# Patient Record
Sex: Female | Born: 1945 | Race: White | Hispanic: No | Smoking: Former smoker
Health system: Southern US, Community
[De-identification: ages and names within clinical notes are randomized; demographics above are authoritative.]

## PROBLEM LIST (undated history)

## (undated) DIAGNOSIS — J45909 Unspecified asthma, uncomplicated: Secondary | ICD-10-CM

## (undated) DIAGNOSIS — Z973 Presence of spectacles and contact lenses: Secondary | ICD-10-CM

## (undated) DIAGNOSIS — F32A Depression, unspecified: Secondary | ICD-10-CM

## (undated) DIAGNOSIS — I2489 Other forms of acute ischemic heart disease: Secondary | ICD-10-CM

## (undated) DIAGNOSIS — I509 Heart failure, unspecified: Secondary | ICD-10-CM

## (undated) DIAGNOSIS — E079 Disorder of thyroid, unspecified: Secondary | ICD-10-CM

## (undated) DIAGNOSIS — E039 Hypothyroidism, unspecified: Secondary | ICD-10-CM

## (undated) DIAGNOSIS — J449 Chronic obstructive pulmonary disease, unspecified: Secondary | ICD-10-CM

## (undated) DIAGNOSIS — K219 Gastro-esophageal reflux disease without esophagitis: Secondary | ICD-10-CM

## (undated) DIAGNOSIS — I10 Essential (primary) hypertension: Secondary | ICD-10-CM

## (undated) DIAGNOSIS — I5030 Unspecified diastolic (congestive) heart failure: Secondary | ICD-10-CM

## (undated) DIAGNOSIS — F329 Major depressive disorder, single episode, unspecified: Secondary | ICD-10-CM

## (undated) DIAGNOSIS — M1990 Unspecified osteoarthritis, unspecified site: Secondary | ICD-10-CM

## (undated) DIAGNOSIS — E7800 Pure hypercholesterolemia, unspecified: Secondary | ICD-10-CM

## (undated) DIAGNOSIS — Z8659 Personal history of other mental and behavioral disorders: Secondary | ICD-10-CM

## (undated) DIAGNOSIS — I248 Other forms of acute ischemic heart disease: Secondary | ICD-10-CM

## (undated) HISTORY — DX: Heart failure, unspecified: I50.9

## (undated) HISTORY — DX: Unspecified diastolic (congestive) heart failure: I50.30

## (undated) HISTORY — PX: COLONOSCOPY: SHX174

## (undated) HISTORY — PX: FOOT SURGERY: SHX648

## (undated) HISTORY — PX: SHOULDER SURGERY: SHX246

## (undated) HISTORY — DX: Chronic obstructive pulmonary disease, unspecified: J44.9

## (undated) HISTORY — PX: CHOLECYSTECTOMY: SHX55

## (undated) HISTORY — DX: Other forms of acute ischemic heart disease: I24.89

## (undated) HISTORY — DX: Hypothyroidism, unspecified: E03.9

## (undated) HISTORY — DX: Other forms of acute ischemic heart disease: I24.8

## (undated) HISTORY — PX: HAND SURGERY: SHX662

---

## 2004-10-26 ENCOUNTER — Ambulatory Visit: Payer: Self-pay | Admitting: Orthopedic Surgery

## 2005-03-15 ENCOUNTER — Ambulatory Visit: Payer: Self-pay | Admitting: Internal Medicine

## 2005-03-20 ENCOUNTER — Ambulatory Visit: Payer: Self-pay | Admitting: Internal Medicine

## 2005-11-03 ENCOUNTER — Ambulatory Visit: Payer: Self-pay | Admitting: Internal Medicine

## 2006-09-15 IMAGING — CT CT ABDOMEN W/ CM
1 of 2 series · 15 of 32 positions shown, 19 images · non-contrast
Comparison: none

REASON FOR EXAM: rt side abd pain farther evaluation of pancreas
COMMENTS:

[Series 2: pancreas · axial · 0.71mm/px · z∈[-846,-618]mm · 15 of 63 slices shown, 19 images]
[im 3/63  soft-tissue]
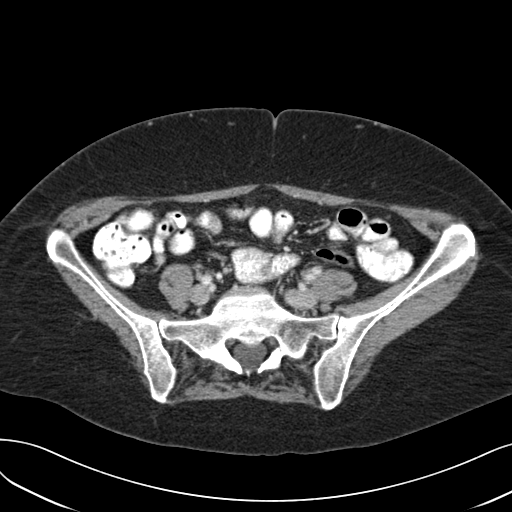
[im 3/63  bone]
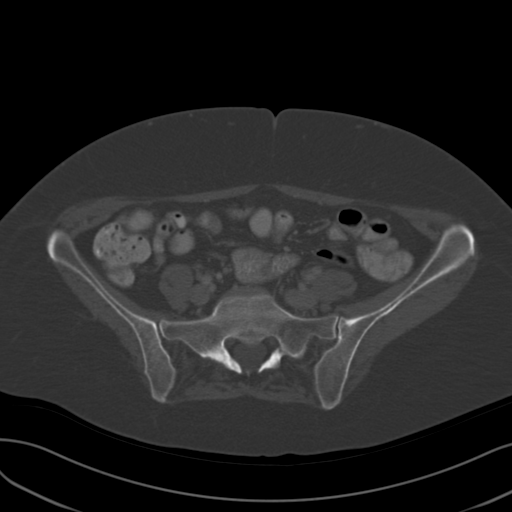
[im 9/63  soft-tissue]
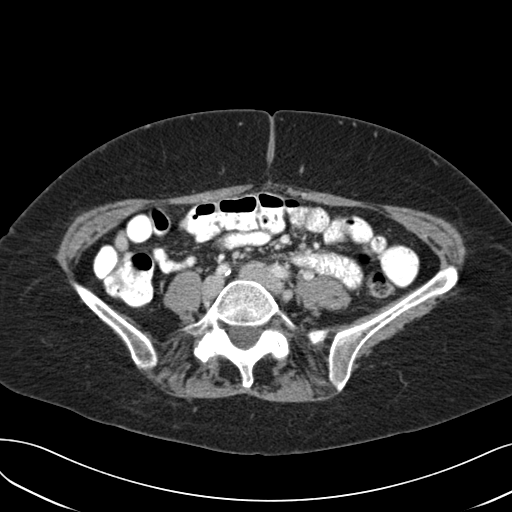
[im 14/63  soft-tissue]
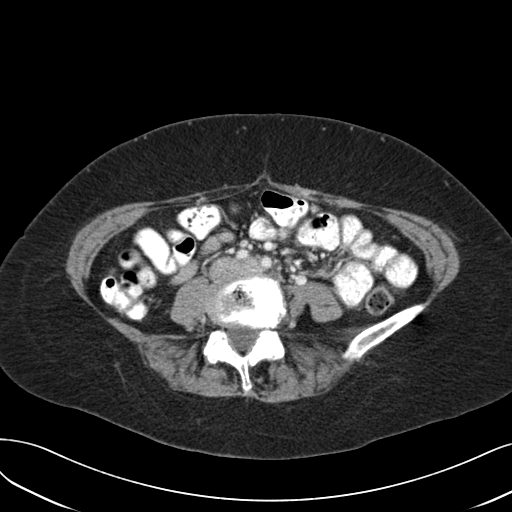
[im 17/63  soft-tissue]
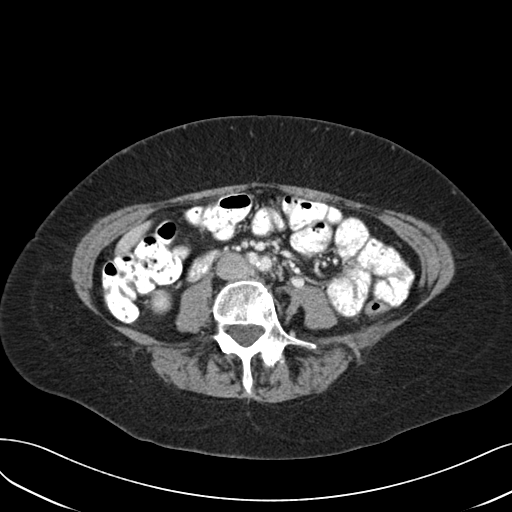
[im 22/63  soft-tissue]
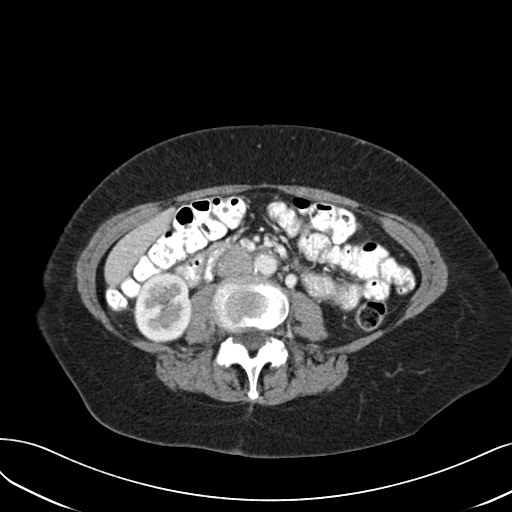
[im 27/63  soft-tissue]
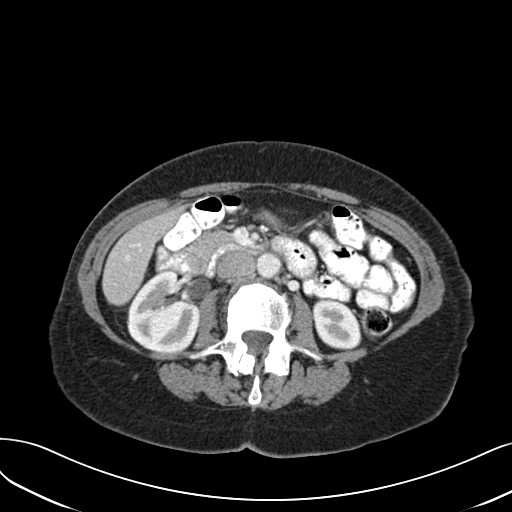
[im 33/63  soft-tissue]
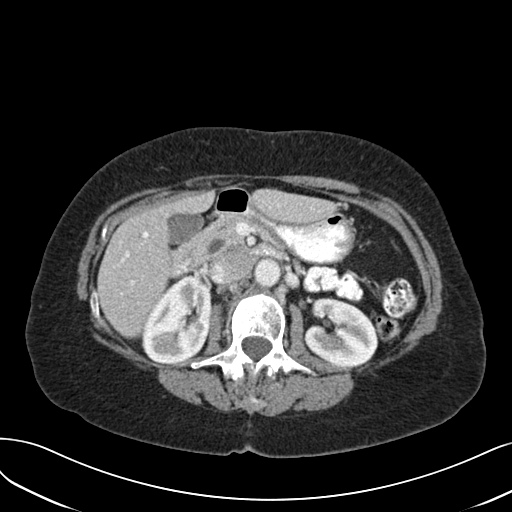
[im 36/63  soft-tissue]
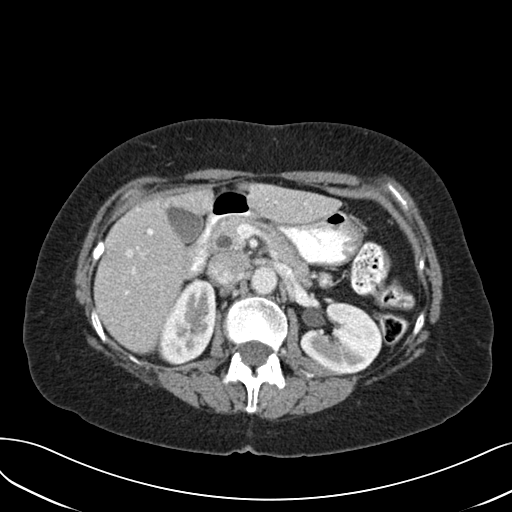
[im 41/63  soft-tissue]
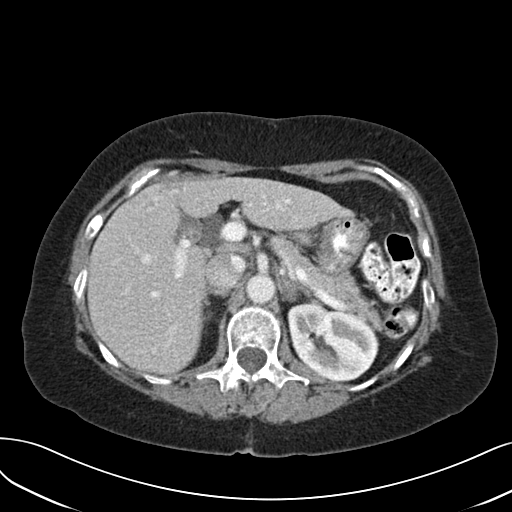
[im 41/63  bone]
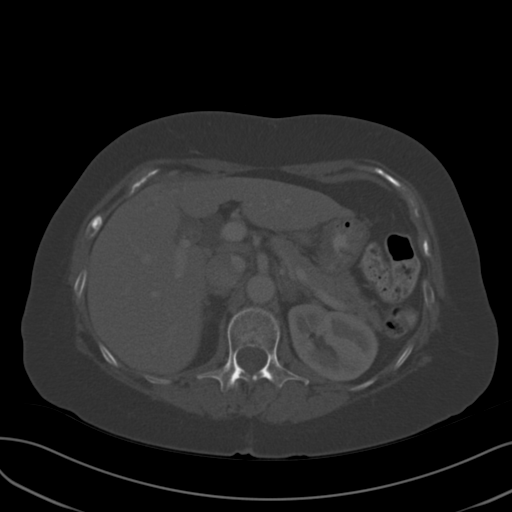
[im 46/63  soft-tissue]
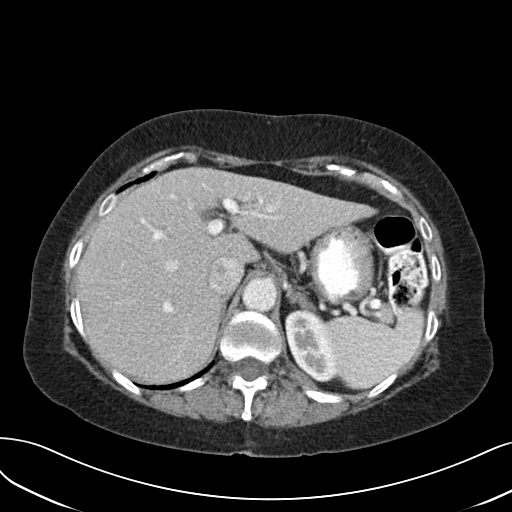
[im 49/63  soft-tissue]
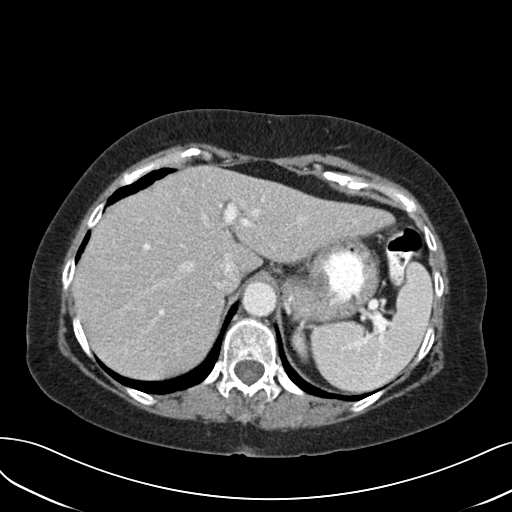
[im 52/63  lung]
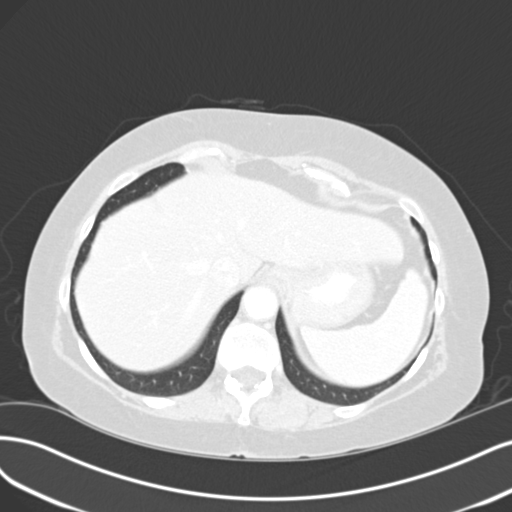
[im 54/63  soft-tissue]
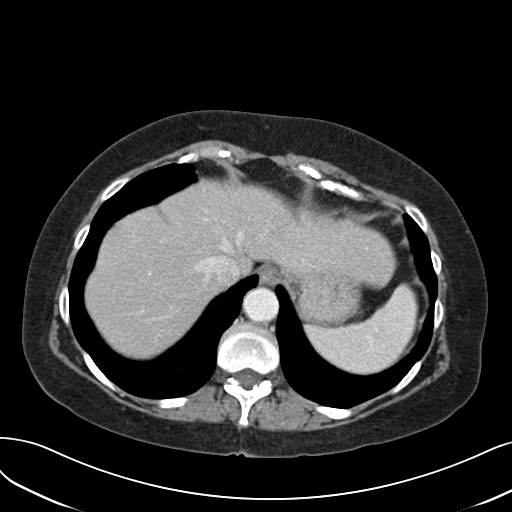
[im 54/63  lung]
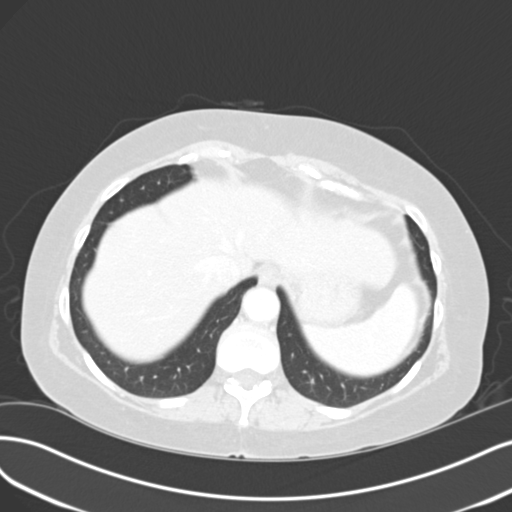
[im 57/63  lung]
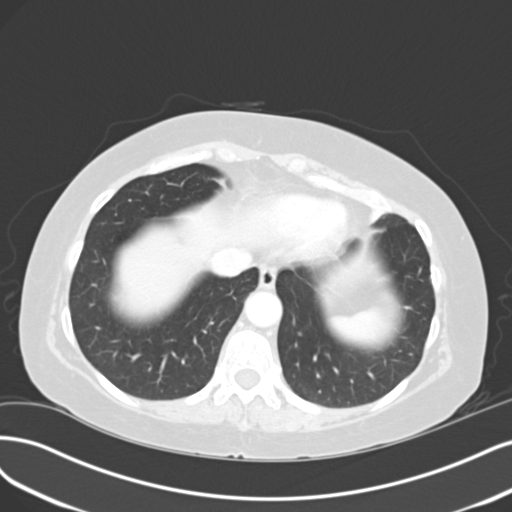
[im 60/63  soft-tissue]
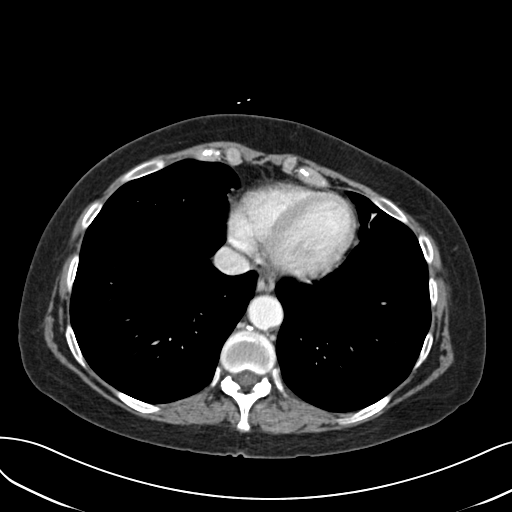
[im 60/63  lung]
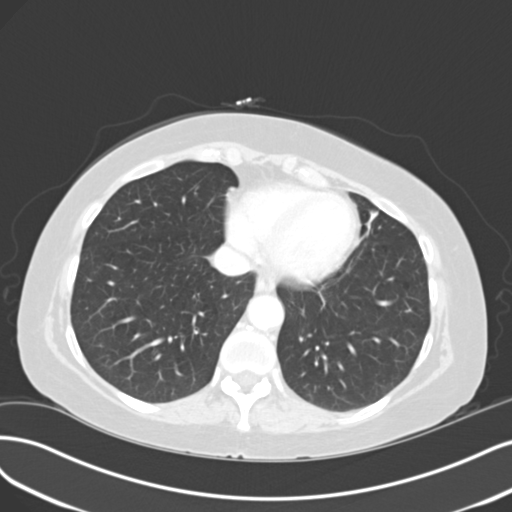

[15 of 32 positions shown; findings below may reference images not displayed]

PROCEDURE:     CT  - CT ABDOMEN COMPLEX W  - March 20, 2005  [DATE]

RESULT:     Spiral 4 mm sections were obtained from the lung bases through
the superior iliac crest status post intravenous administration of 100 ml of
Isovue 370 and oral contrast.

The liver, spleen, adrenals, kidneys are unremarkable.

Evaluation of the pancreas demonstrates no evidence of pancreatic masses,
free fluid nor drainable loculated fluid collections. The pancreas does
demonstrate a normal undulating border. There is slight prominence of the
pancreatic duct as well as dilatation of the common bile duct.  These
findings can be further evaluated with MRCP if clinically warranted to
evaluate the ampullary region. There does not appear to be evidence of
abdominal free fluid, drainable loculated fluid collections, masses or
adenopathy. The celiac, SMA, IMA, portal vein and SMV are opacified.  This
does not reflect the caliber of the vessels. There appears to be partially
calcified gallstones within the gallbladder.
IMPRESSION: 1)Common bile duct dilatation as well as prominence of the pancreatic duct.
Further evaluation of the ampulla is recommended if clinically warranted.
This can be achieved with ERCP or possibly MRCP if clinically warranted.

## 2006-10-30 ENCOUNTER — Ambulatory Visit: Payer: Self-pay | Admitting: Orthopedic Surgery

## 2007-11-29 ENCOUNTER — Ambulatory Visit: Payer: Self-pay | Admitting: Orthopedic Surgery

## 2007-12-05 ENCOUNTER — Ambulatory Visit: Payer: Self-pay | Admitting: Orthopedic Surgery

## 2008-10-07 ENCOUNTER — Ambulatory Visit: Payer: Self-pay | Admitting: Podiatry

## 2008-11-24 ENCOUNTER — Ambulatory Visit: Payer: Self-pay | Admitting: Podiatry

## 2008-11-27 ENCOUNTER — Ambulatory Visit: Payer: Self-pay | Admitting: Podiatry

## 2009-02-24 ENCOUNTER — Ambulatory Visit: Payer: Self-pay | Admitting: Podiatry

## 2009-04-21 ENCOUNTER — Ambulatory Visit: Payer: Self-pay | Admitting: Podiatry

## 2009-04-28 ENCOUNTER — Ambulatory Visit: Payer: Self-pay | Admitting: Podiatry

## 2010-08-18 ENCOUNTER — Ambulatory Visit: Payer: Self-pay | Admitting: Internal Medicine

## 2010-09-20 ENCOUNTER — Ambulatory Visit: Payer: Self-pay | Admitting: Internal Medicine

## 2010-12-28 ENCOUNTER — Ambulatory Visit: Payer: Self-pay | Admitting: Internal Medicine

## 2011-01-13 ENCOUNTER — Ambulatory Visit: Payer: Self-pay | Admitting: Internal Medicine

## 2011-05-19 ENCOUNTER — Ambulatory Visit: Payer: Self-pay | Admitting: General Surgery

## 2011-05-22 LAB — PATHOLOGY REPORT

## 2011-09-26 ENCOUNTER — Ambulatory Visit: Payer: Self-pay | Admitting: Internal Medicine

## 2012-09-26 ENCOUNTER — Ambulatory Visit: Payer: Self-pay | Admitting: Internal Medicine

## 2013-09-30 ENCOUNTER — Ambulatory Visit: Payer: Self-pay | Admitting: Internal Medicine

## 2013-11-03 ENCOUNTER — Emergency Department: Payer: Self-pay | Admitting: Emergency Medicine

## 2014-01-16 ENCOUNTER — Emergency Department (HOSPITAL_COMMUNITY): Payer: Medicare Other

## 2014-01-16 ENCOUNTER — Emergency Department (HOSPITAL_COMMUNITY)
Admission: EM | Admit: 2014-01-16 | Discharge: 2014-01-16 | Disposition: A | Discharge status: Home / Self Care | Payer: Medicare Other | Attending: Emergency Medicine | Admitting: Emergency Medicine

## 2014-01-16 ENCOUNTER — Encounter (HOSPITAL_COMMUNITY): Payer: Self-pay | Admitting: Emergency Medicine

## 2014-01-16 DIAGNOSIS — Z87891 Personal history of nicotine dependence: Secondary | ICD-10-CM | POA: Insufficient documentation

## 2014-01-16 DIAGNOSIS — R0600 Dyspnea, unspecified: Secondary | ICD-10-CM

## 2014-01-16 DIAGNOSIS — E7800 Pure hypercholesterolemia, unspecified: Secondary | ICD-10-CM | POA: Insufficient documentation

## 2014-01-16 DIAGNOSIS — M545 Low back pain, unspecified: Secondary | ICD-10-CM | POA: Insufficient documentation

## 2014-01-16 DIAGNOSIS — R0989 Other specified symptoms and signs involving the circulatory and respiratory systems: Principal | ICD-10-CM | POA: Insufficient documentation

## 2014-01-16 DIAGNOSIS — Z7982 Long term (current) use of aspirin: Secondary | ICD-10-CM | POA: Diagnosis not present

## 2014-01-16 DIAGNOSIS — R0609 Other forms of dyspnea: Secondary | ICD-10-CM | POA: Insufficient documentation

## 2014-01-16 DIAGNOSIS — M79609 Pain in unspecified limb: Secondary | ICD-10-CM

## 2014-01-16 DIAGNOSIS — E079 Disorder of thyroid, unspecified: Secondary | ICD-10-CM | POA: Insufficient documentation

## 2014-01-16 DIAGNOSIS — M7989 Other specified soft tissue disorders: Secondary | ICD-10-CM | POA: Insufficient documentation

## 2014-01-16 DIAGNOSIS — R0602 Shortness of breath: Secondary | ICD-10-CM | POA: Diagnosis present

## 2014-01-16 DIAGNOSIS — M129 Arthropathy, unspecified: Secondary | ICD-10-CM | POA: Insufficient documentation

## 2014-01-16 DIAGNOSIS — Z79899 Other long term (current) drug therapy: Secondary | ICD-10-CM | POA: Insufficient documentation

## 2014-01-16 HISTORY — DX: Disorder of thyroid, unspecified: E07.9

## 2014-01-16 HISTORY — DX: Unspecified osteoarthritis, unspecified site: M19.90

## 2014-01-16 HISTORY — DX: Pure hypercholesterolemia, unspecified: E78.00

## 2014-01-16 LAB — URINALYSIS, ROUTINE W REFLEX MICROSCOPIC
BILIRUBIN URINE: NEGATIVE
Glucose, UA: NEGATIVE mg/dL
Ketones, ur: NEGATIVE mg/dL
Leukocytes, UA: NEGATIVE
Nitrite: NEGATIVE
PROTEIN: NEGATIVE mg/dL
Specific Gravity, Urine: 1.008 (ref 1.005–1.030)
Urobilinogen, UA: 0.2 mg/dL (ref 0.0–1.0)
pH: 6 (ref 5.0–8.0)

## 2014-01-16 LAB — URINE MICROSCOPIC-ADD ON

## 2014-01-16 LAB — CBC
HCT: 36 % (ref 36.0–46.0)
HEMOGLOBIN: 11.9 g/dL — AB (ref 12.0–15.0)
MCH: 29.2 pg (ref 26.0–34.0)
MCHC: 33.1 g/dL (ref 30.0–36.0)
MCV: 88.2 fL (ref 78.0–100.0)
Platelets: 382 10*3/uL (ref 150–400)
RBC: 4.08 MIL/uL (ref 3.87–5.11)
RDW: 14.6 % (ref 11.5–15.5)
WBC: 8.2 10*3/uL (ref 4.0–10.5)

## 2014-01-16 LAB — BASIC METABOLIC PANEL
Anion gap: 14 (ref 5–15)
BUN: 17 mg/dL (ref 6–23)
CALCIUM: 8.9 mg/dL (ref 8.4–10.5)
CO2: 22 meq/L (ref 19–32)
Chloride: 103 mEq/L (ref 96–112)
Creatinine, Ser: 0.5 mg/dL (ref 0.50–1.10)
GFR calc Af Amer: >90 mL/min (ref >90)
GFR calc non Af Amer: >90 mL/min (ref >90)
Glucose, Bld: 85 mg/dL (ref 70–99)
POTASSIUM: 4.1 meq/L (ref 3.7–5.3)
SODIUM: 139 meq/L (ref 137–147)

## 2014-01-16 LAB — PROTIME-INR
INR: 1 (ref 0.00–1.49)
PROTHROMBIN TIME: 13.2 s (ref 11.6–15.2)

## 2014-01-16 MED ORDER — HYDROCODONE-ACETAMINOPHEN 5-325 MG PO TABS
1.0000 | ORAL_TABLET | Freq: Once | ORAL | Status: AC
  Administered 2014-01-16: 1 via ORAL
  Filled 2014-01-16: qty 1

## 2014-01-16 MED ORDER — IOHEXOL 350 MG/ML SOLN
100.0000 mL | Freq: Once | INTRAVENOUS | Status: AC | PRN
  Administered 2014-01-16: 100 mL via INTRAVENOUS

## 2014-01-16 MED ORDER — SODIUM CHLORIDE 0.9 % IV BOLUS (SEPSIS): 500.0000 mL | Freq: Once | INTRAVENOUS | Status: DC

## 2014-01-16 MED ORDER — SODIUM CHLORIDE 0.9 % IV SOLN: INTRAVENOUS | Status: DC

## 2014-01-16 MED ORDER — SODIUM CHLORIDE 0.9 % IV SOLN
INTRAVENOUS | Status: DC
  Administered 2014-01-16: 15:00:00 via INTRAVENOUS

## 2014-01-16 NOTE — ED Provider Notes (Signed)
CSN: [id number]     Arrival date & time 01/16/14  1121 History   First MD Initiated Contact with Patient 01/16/14 1251     Chief Complaint  Patient presents with  . Back Pain  . Shortness of Breath     (Consider location/radiation/quality/duration/timing/severity/associated sxs/prior Treatment) HPI  Carolyn Steele is a 68 y.o. female who is here for evaluation of left leg swelling,  low back pain and shortness of breath. Pain smptoms present for several months, and are unrelenting. Shortness of breath started today. She saw an orthopedist, today, to evaluate these problems, and was referred here with the concern for a DVT, and pulmonary embolus. She denies fever, chills, cough, weakness, or dizziness. Pain in her left leg is present regardless of standing or walking. She is able to walk. There are no other known modifying factors.    Past Medical History  Diagnosis Date  . Thyroid disease hypo  . Arthritis   . Hypercholesteremia    Past Surgical History  Procedure Laterality Date  . Hand surgery    . Cholecystectomy    . Foot surgery    . Shoulder surgery     No family history on file. History  Substance Use Topics  . Smoking status: Former Research scientist (life sciences)  . Smokeless tobacco: Not on file  . Alcohol Use: No   OB History   Grav Para Term Preterm Abortions TAB SAB Ect Mult Living                 Review of Systems  All other systems reviewed and are negative.     Allergies  Adhesive; Celebrex; and Mobic  Home Medications   Prior to Admission medications   Medication Sig Start Date End Date Taking? Authorizing Provider  alendronate (FOSAMAX) 70 MG tablet Take 70 mg by mouth once a week. Take with a full glass of water on an empty stomach. Every Sunday   Yes Historical Provider, MD  aspirin 81 MG tablet Take 81 mg by mouth daily.   Yes Historical Provider, MD  Calcium Carb-Cholecalciferol (CALCIUM 600 + D PO) Take 1 tablet by mouth daily.   Yes Historical Provider, MD   fexofenadine (ALLEGRA) 180 MG tablet Take 180 mg by mouth daily.   Yes Historical Provider, MD  gabapentin (NEURONTIN) 300 MG capsule Take 300 mg by mouth daily.   Yes Historical Provider, MD  Glucosamine HCl 1500 MG TABS Take 1 tablet by mouth daily.   Yes Historical Provider, MD  guaifenesin (MUCUS RELIEF) 400 MG TABS tablet Take 400 mg by mouth daily.   Yes Historical Provider, MD  levothyroxine (SYNTHROID, LEVOTHROID) 150 MCG tablet Take 150 mcg by mouth daily before breakfast.   Yes Historical Provider, MD  lidocaine (LIDODERM) 5 % Place 1 patch onto the skin every 12 (twelve) hours as needed (back pain). Remove & Discard patch within 12 hours or as directed by MD   Yes Historical Provider, MD  Multiple Vitamins-Minerals (MULTIVITAMIN & MINERAL PO) Take 1 tablet by mouth daily.   Yes Historical Provider, MD  Omega-3 Fatty Acids (FISH OIL) 1000 MG CAPS Take 1 capsule by mouth daily.   Yes Historical Provider, MD  oxyCODONE (OXY IR/ROXICODONE) 5 MG immediate release tablet Take 5 mg by mouth every 6 (six) hours as needed for severe pain.   Yes Historical Provider, MD  pravastatin (PRAVACHOL) 40 MG tablet Take 40 mg by mouth daily.   Yes Historical Provider, MD  Pseudoeph-Doxylamine-DM-APAP (NYQUIL PO) Take 2 capsules  by mouth every 6 (six) hours.   Yes Historical Provider, MD  traMADol (ULTRAM) 50 MG tablet Take 50 mg by mouth every 6 (six) hours as needed.   Yes Historical Provider, MD   BP 149/85  Pulse 83  Temp(Src) 98.5 F (36.9 C) (Oral)  Resp 16  SpO2 100% Physical Exam  Nursing note and vitals reviewed. Constitutional: She is oriented to person, place, and time. She appears well-developed and well-nourished.  HENT:  Head: Normocephalic and atraumatic.  Eyes: Conjunctivae and EOM are normal. Pupils are equal, round, and reactive to light.  Neck: Normal range of motion and phonation normal. Neck supple.  Cardiovascular: Normal rate and regular rhythm.   Pulmonary/Chest: Effort  normal and breath sounds normal. She exhibits no tenderness.  Abdominal: Soft. She exhibits no distension. There is no tenderness. There is no guarding.  Musculoskeletal: Normal range of motion.  Leg swelling, left greater than right. No localized areas of redness or deformity. The lower lumbar is mildly tender to palpation. Negative straight leg raising bilaterally.  Neurological: She is alert and oriented to person, place, and time. She exhibits normal muscle tone.  Skin: Skin is warm and dry.  Psychiatric: She has a normal mood and affect. Her behavior is normal. Judgment and thought content normal.    ED Course  Procedures (including critical care time)  Medications  iohexol (OMNIPAQUE) 350 MG/ML injection 100 mL (100 mLs Intravenous Contrast Given 01/16/14 1516)  HYDROcodone-acetaminophen (NORCO/VICODIN) 5-325 MG per tablet 1 tablet (1 tablet Oral Given 01/16/14 1535)   Venous duplex imaging is negative, left leg.  No results found.  Ct Angio Chest Pe W/cm &/or Wo Cm  01/16/2014   CLINICAL DATA:  Shortness of breath and left lower extremity swelling; history of fall 3 months ago with low back pain  EXAM: CT ANGIOGRAPHY CHEST WITH CONTRAST  TECHNIQUE: Multidetector CT imaging of the chest was performed using the standard protocol during bolus administration of intravenous contrast. Multiplanar CT image reconstructions and MIPs were obtained to evaluate the vascular anatomy.  CONTRAST:  19m OMNIPAQUE IOHEXOL 350 MG/ML SOLN  COMPARISON:  PA and lateral chest of Sep 20, 2010  FINDINGS: Contrast within the pulmonary arterial tree is normal. There are no filling defects to suggest an acute pulmonary embolism. The caliber of the thoracic aorta is normal. There is no false lumen. The cardiac chambers are normal in size. There is no pleural nor pericardial effusion. There is a small hiatal hernia. There is no mediastinal nor hilar lymphadenopathy.  The lungs exhibit mild emphysematous changes. There  is no alveolar infiltrate. There is apical pleural scarring bilaterally, and there is lingular scarring.  The sternum, thoracic spine, and visualized ribs exhibit no acute abnormalities. There is an old fracture of the lateral aspect of the right tenth rib. The eleventh and twelfth ribs are not demonstrated in their entirety.  Within the upper abdomen the observed portions of the liver and spleen are unremarkable.  Review of the MIP images confirms the above findings.  IMPRESSION: 1. There is no acute pulmonary embolism nor acute thoracic aortic pathology. 2. There is no evidence of CHF nor of pneumonia. Mild emphysematous changes are present bilaterally. There is a small hiatal hernia. 3. There is an old lateral right tenth rib fracture. The eleventh and twelfth ribs cannot be assessed.   Electronically Signed   By: David  JMartinique  On: 01/16/2014 15:35     Labs Review Labs Reviewed  CBC - Abnormal; Notable  for the following:    Hemoglobin 11.9 (*)    All other components within normal limits  URINALYSIS, ROUTINE W REFLEX MICROSCOPIC - Abnormal; Notable for the following:    Hgb urine dipstick TRACE (*)    All other components within normal limits  URINE CULTURE  BASIC METABOLIC PANEL  PROTIME-INR  URINE MICROSCOPIC-ADD ON    Results for orders placed during the hospital encounter of 01/16/14  URINE CULTURE      Result Value Ref Range   Specimen Description URINE, CLEAN CATCH     Special Requests NONE     Culture  Setup Time       Value: 01/16/2014 22:05     Performed at [facility]       Value: 20,OOO COLONIES/ML     Performed at [facility]   Culture       Value: Multiple bacterial morphotypes present, none predominant. Suggest appropriate recollection if clinically indicated.     Performed at [facility]   Report Status 01/17/2014 FINAL    CBC      Result Value Ref Range   WBC 8.2  4.0 - 10.5 K/uL   RBC 4.08  3.87 - 5.11 MIL/uL    Hemoglobin 11.9 (*) 12.0 - 15.0 g/dL   HCT 36.0  36.0 - 46.0 %   MCV 88.2  78.0 - 100.0 fL   MCH 29.2  26.0 - 34.0 pg   MCHC 33.1  30.0 - 36.0 g/dL   RDW 14.6  11.5 - 15.5 %   Platelets 382  150 - 400 K/uL  BASIC METABOLIC PANEL      Result Value Ref Range   Sodium 139  137 - 147 mEq/L   Potassium 4.1  3.7 - 5.3 mEq/L   Chloride 103  96 - 112 mEq/L   CO2 22  19 - 32 mEq/L   Glucose, Bld 85  70 - 99 mg/dL   BUN 17  6 - 23 mg/dL   Creatinine, Ser 0.50  0.50 - 1.10 mg/dL   Calcium 8.9  8.4 - 10.5 mg/dL   GFR calc non Af Amer >90  >90 mL/min   GFR calc Af Amer >90  >90 mL/min   Anion gap 14  5 - 15  PROTIME-INR      Result Value Ref Range   Prothrombin Time 13.2  11.6 - 15.2 seconds   INR 1.00  0.00 - 1.49  URINALYSIS, ROUTINE W REFLEX MICROSCOPIC      Result Value Ref Range   Color, Urine YELLOW  YELLOW   APPearance CLEAR  CLEAR   Specific Gravity, Urine 1.008  1.005 - 1.030   pH 6.0  5.0 - 8.0   Glucose, UA NEGATIVE  NEGATIVE mg/dL   Hgb urine dipstick TRACE (*) NEGATIVE   Bilirubin Urine NEGATIVE  NEGATIVE   Ketones, ur NEGATIVE  NEGATIVE mg/dL   Protein, ur NEGATIVE  NEGATIVE mg/dL   Urobilinogen, UA 0.2  0.0 - 1.0 mg/dL   Nitrite NEGATIVE  NEGATIVE   Leukocytes, UA NEGATIVE  NEGATIVE  URINE MICROSCOPIC-ADD ON      Result Value Ref Range   Squamous Epithelial / LPF RARE  RARE   WBC, UA 0-2  <3 WBC/hpf   RBC / HPF 0-2  <3 RBC/hpf   Bacteria, UA RARE  RARE    Imaging Review No results found.   EKG Interpretation   Date/Time:  Friday January 16 2014 11:38:11  EDT Ventricular Rate:  78 PR Interval:  167 QRS Duration: 88 QT Interval:  370 QTC Calculation: 421 R Axis:   12 Text Interpretation:  Sinus rhythm Low voltage, precordial leads Baseline  wander in lead(s) I III aVL V2 V6 No old tracing to compare Confirmed by  [facility]  MD, Earsel Shouse [phone]) on 01/16/2014 3:36:44 PM      MDM   Final diagnoses:  Left leg swelling  Dyspnea   Nonspecific left leg  swelling and shortness of breath. There is no evidence for venous thromboembolism. Doubt fracture, cellulitis, metabolic instability, or serious bacterial infection.   Nursing Notes Reviewed/ Care Coordinated Applicable Imaging Reviewed Interpretation of Laboratory Data incorporated into ED treatment  The patient appears reasonably screened and/or stabilized for discharge and I doubt any other medical condition or other [facility] requiring further screening, evaluation, or treatment in the ED at this time prior to discharge.  Plan: Home Medications- usual; Home Treatments- rest; return here if the recommended treatment, does not improve the symptoms; Recommended follow up- PCP checkup 1 week    Richarda Blade, MD 01/19/14 1028

## 2014-01-16 NOTE — Progress Notes (Signed)
*  Preliminary Results* Left lower extremity venous duplex completed. Left lower extremity is negative for deep vein thrombosis. There is no evidence of left Baker's cyst.  Preliminary results discussed with Dr.Wentz.  01/16/2014 2:51 PM  Maudry Mayhew, RVT, RDCS, RDMS

## 2014-01-16 NOTE — ED Notes (Signed)
Pt states she fell 3 months ago, c/o lower back pain. Pt was sent over here by PCP due to SOB and left lower leg swelling.

## 2014-01-17 LAB — URINE CULTURE

## 2014-09-03 ENCOUNTER — Other Ambulatory Visit: Payer: Self-pay | Admitting: Internal Medicine

## 2014-09-03 DIAGNOSIS — Z1231 Encounter for screening mammogram for malignant neoplasm of breast: Secondary | ICD-10-CM

## 2014-10-06 ENCOUNTER — Ambulatory Visit
Admission: RE | Admit: 2014-10-06 | Discharge: 2014-10-06 | Disposition: A | Discharge status: Home / Self Care | Payer: Medicare Other | Source: Ambulatory Visit | Attending: Internal Medicine | Admitting: Internal Medicine

## 2014-10-06 DIAGNOSIS — Z1231 Encounter for screening mammogram for malignant neoplasm of breast: Secondary | ICD-10-CM | POA: Insufficient documentation

## 2015-05-13 ENCOUNTER — Other Ambulatory Visit: Payer: Self-pay | Admitting: Anesthesiology

## 2015-05-28 ENCOUNTER — Encounter (HOSPITAL_COMMUNITY): Payer: Self-pay

## 2015-05-28 ENCOUNTER — Encounter (HOSPITAL_COMMUNITY)
Admission: RE | Admit: 2015-05-28 | Discharge: 2015-05-28 | Disposition: A | Discharge status: Home / Self Care | Payer: Medicare Other | Source: Ambulatory Visit | Attending: Anesthesiology | Admitting: Anesthesiology

## 2015-05-28 DIAGNOSIS — Z01812 Encounter for preprocedural laboratory examination: Secondary | ICD-10-CM | POA: Insufficient documentation

## 2015-05-28 DIAGNOSIS — G8929 Other chronic pain: Secondary | ICD-10-CM | POA: Diagnosis not present

## 2015-05-28 HISTORY — DX: Gastro-esophageal reflux disease without esophagitis: K21.9

## 2015-05-28 HISTORY — DX: Presence of spectacles and contact lenses: Z97.3

## 2015-05-28 HISTORY — DX: Depression, unspecified: F32.A

## 2015-05-28 HISTORY — DX: Personal history of other mental and behavioral disorders: Z86.59

## 2015-05-28 HISTORY — DX: Major depressive disorder, single episode, unspecified: F32.9

## 2015-05-28 LAB — CBC
HCT: 40.2 % (ref 36.0–46.0)
Hemoglobin: 12.8 g/dL (ref 12.0–15.0)
MCH: 28.6 pg (ref 26.0–34.0)
MCHC: 31.8 g/dL (ref 30.0–36.0)
MCV: 89.7 fL (ref 78.0–100.0)
PLATELETS: 315 10*3/uL (ref 150–400)
RBC: 4.48 MIL/uL (ref 3.87–5.11)
RDW: 14.2 % (ref 11.5–15.5)
WBC: 7.7 10*3/uL (ref 4.0–10.5)

## 2015-05-28 LAB — BASIC METABOLIC PANEL
Anion gap: 8 (ref 5–15)
BUN: 11 mg/dL (ref 6–20)
CALCIUM: 9.9 mg/dL (ref 8.9–10.3)
CHLORIDE: 101 mmol/L (ref 101–111)
CO2: 31 mmol/L (ref 22–32)
CREATININE: 0.73 mg/dL (ref 0.44–1.00)
GFR calc Af Amer: >60 mL/min (ref >60)
GFR calc non Af Amer: >60 mL/min (ref >60)
GLUCOSE: 88 mg/dL (ref 65–99)
Potassium: 4.4 mmol/L (ref 3.5–5.1)
Sodium: 140 mmol/L (ref 135–145)

## 2015-05-28 LAB — SURGICAL PCR SCREEN
MRSA, PCR: NEGATIVE
Staphylococcus aureus: POSITIVE — AB

## 2015-05-28 NOTE — Progress Notes (Signed)
PCP- Dr. Benita Stabile Cardiologist - Denies  EKG/CXR - denies Echo/Stress test/Cardiac Cath - denies  Patient denies chest pain and shortness of breath at PAT appointment.

## 2015-05-28 NOTE — Pre-Procedure Instructions (Signed)
Carolyn Steele  05/28/2015      [facility] [address], [facility] [address] AT [address] [facility] [place] [address] Phone: [phone] Fax: [phone]    Your procedure is scheduled on Friday, February 3rd, 2017.  Report to [facility] Admitting at 5:30 A.M.  Call this number if you have problems the morning of surgery:  [phone]   Remember:  Do not eat food or drink liquids after midnight.   Take these medicines the morning of surgery with A SIP OF WATER: Fexofenadine (Allegra), Gabapentin (Neurontin), Levothyroxine (Synthroid), Omeprazole (Prilosec), Oxycodone (Oxy IR) if needed, Ranitidine (Zantac), Tramadol (Ultram) if needed.  Stop taking: Aspirin, NSAIDS, Ibuprofen, Advil, Motrin, Aleve, Naproxen, BC's, Goody's, Fish oil, all herbal medications, and all vitamins.     Do not wear jewelry, make-up or nail polish.  Do not wear lotions, powders, or perfumes.  You may NOT wear deodorant.  Do not shave 48 hours prior to surgery.    Do not bring valuables to the hospital.  [facility] is not responsible for any belongings or valuables.  Contacts, dentures or bridgework may not be worn into surgery.  Leave your suitcase in the car.  After surgery it may be brought to your room.  For patients admitted to the hospital, discharge time will be determined by your treatment team.  Patients discharged the day of surgery will not be allowed to drive home.   Special instructions:  See attached.   Please read over the following fact sheets that you were given. Pain Booklet, Coughing and Deep Breathing, MRSA Information and Surgical Site Infection Prevention

## 2015-06-03 NOTE — H&P (Signed)
Carolyn Steele is an 70 y.o. female.   Chief Complaint: back pain HPI: Very pleasant 70 year old woman, referred to me by Dr. Suella Broad for evaluation for SCS implant.  Carolyn Steele as a history of intermittent back pain, but beginning in June 2015 started experiencing escalating back pain on a consistent basis.  She's had limited success with medication management, was seen by Dr. Tonita Cong for surgical consideration.  He recommended against surgery and she was referred to Dr. Nelva Bush for further conservative care.  Physical therapy was not beneficial.  Other approaches the pain control were not beneficial either, and ultimately she underwent SCS trial after an appropriate psychological evaluation was completed.  I don't have a copy of that psychological evaluation, though there is reference to it in Dr. Jeralyn Ruths notes.  She underwent SCS trial with a Nevro high-frequency device, and had 70-80% improvement in her pain symptoms.  She reports that she had some worsening pain after the trial, but feels that that was related to substantially increased activity while she has the trial in, because she was feeling so good.  Dr. Nelva Bush has now referred her to me for permanent implantation.  On presentation today, patient describes deep, achy back pain across both sides of her low back, with radiation into the left lower extremity through the buttock area.  Symptoms generally stop at about the level of the buttock-thigh line.  Modified factors that improve her pain include rest and medication.  Symptoms worsen with standing, walking, bending.  She denies numbness, tingling, or weakness.  She denies give way.  She denies loss of bowel or bladder function.  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 Thyroid disease hypo  . Arthritis   . Hypercholesteremia   . Acid reflux   . History of depression   . Depression    . Wears glasses     Past Surgical History  Procedure Laterality Date  . Hand surgery Bilateral   . Cholecystectomy    . Foot surgery Right   . Shoulder surgery Left   . Colonoscopy      No family history on file. Social History:  reports that she has quit smoking. She does not have any smokeless tobacco history on file. She reports that she does not drink alcohol or use illicit drugs.  Allergies:  Allergies  Allergen Reactions  . Celebrex [Celecoxib] Swelling  . Mobic [Meloxicam] Swelling  . Adhesive [Tape] Itching and Rash    Medications Prior to Admission  Medication Sig Dispense Refill  . alendronate (FOSAMAX) 70 MG tablet Take 70 mg by mouth once a week. Take with a full glass of water on an empty stomach. Every Sunday    . aspirin 81 MG tablet Take 81 mg by mouth daily.    . Calcium Carb-Cholecalciferol (CALCIUM 600 + D PO) Take 1 tablet by mouth daily.    . Cranberry-Vitamin C 140-100 MG CAPS Take 1 capsule by mouth daily.    . DULoxetine (CYMBALTA) 20 MG capsule Take 20 mg by mouth daily.    . fexofenadine (ALLEGRA) 180 MG tablet Take 180 mg by mouth daily.    Marland Kitchen gabapentin (NEURONTIN) 300 MG capsule Take 300 mg by mouth daily.    . Glucosamine HCl 1500 MG TABS Take 1 tablet by mouth daily.    Marland Kitchen guaifenesin (MUCUS RELIEF) 400 MG TABS tablet Take 400 mg by mouth daily.    Marland Kitchen  levothyroxine (SYNTHROID, LEVOTHROID) 150 MCG tablet Take 150 mcg by mouth daily before breakfast.    . Multiple Vitamins-Minerals (MULTIVITAMIN & MINERAL PO) Take 1 tablet by mouth daily.    . Omega-3 Fatty Acids (FISH OIL) 1000 MG CAPS Take 1 capsule by mouth daily.    Marland Kitchen omeprazole (PRILOSEC) 40 MG capsule Take 40 mg by mouth daily.    Marland Kitchen oxyCODONE (OXY IR/ROXICODONE) 5 MG immediate release tablet Take 5 mg by mouth every 6 (six) hours as needed for severe pain.    . pravastatin (PRAVACHOL) 40 MG tablet Take 40 mg by mouth daily.    . ranitidine (ZANTAC) 150 MG tablet Take 150 mg by mouth daily.     . traMADol (ULTRAM) 50 MG tablet Take 50 mg by mouth every 6 (six) hours as needed for moderate pain.     Marland Kitchen lidocaine (LIDODERM) 5 % Place 1 patch onto the skin every 12 (twelve) hours as needed (back pain). Remove & Discard patch within 12 hours or as directed by MD    . Pseudoeph-Doxylamine-DM-APAP (NYQUIL PO) Take 2 capsules by mouth every 6 (six) hours.      No results found for this or any previous visit (from the past 48 hour(s)). No results found.  Review of Systems  Constitutional: Negative.   HENT: Negative.   Eyes: Negative.   Respiratory: Negative.   Cardiovascular: Negative.   Gastrointestinal: Negative.   Genitourinary: Negative.   Musculoskeletal: Positive for back pain and joint pain. Negative for myalgias and falls.  Skin: Negative.   Neurological: Negative.   Endo/Heme/Allergies: Negative.   Psychiatric/Behavioral: Negative.     There were no vitals taken for this visit. Physical Exam  Constitutional: She is oriented to person, place, and time. She appears well-developed and well-nourished.  HENT:  Head: Normocephalic and atraumatic.  Eyes: Conjunctivae and EOM are normal. Pupils are equal, round, and reactive to light.  Neck: Normal range of motion.  Cardiovascular: Normal rate and regular rhythm.   Musculoskeletal: Normal range of motion.  Neurological: She is alert and oriented to person, place, and time.  Skin: Skin is warm and dry.  Psychiatric: She has a normal mood and affect. Her behavior is normal. Thought content normal.     Assessment/Plan A) back pain, radiculopathy, chronic pain synrome P) permanent SCS implant, Nevro.   Bonna Gains, MD 06/04/2015, 7:32 AM

## 2015-06-04 ENCOUNTER — Ambulatory Visit (HOSPITAL_COMMUNITY)
Admission: RE | Admit: 2015-06-04 | Discharge: 2015-06-04 | Disposition: A | Discharge status: Home / Self Care | Payer: Medicare Other | Source: Ambulatory Visit | Attending: Anesthesiology | Admitting: Anesthesiology

## 2015-06-04 ENCOUNTER — Encounter (HOSPITAL_COMMUNITY): Payer: Self-pay | Admitting: General Practice

## 2015-06-04 ENCOUNTER — Ambulatory Visit (HOSPITAL_COMMUNITY): Payer: Medicare Other

## 2015-06-04 ENCOUNTER — Encounter (HOSPITAL_COMMUNITY)
Admission: RE | Disposition: A | Discharge status: Home / Self Care | Payer: Self-pay | Source: Ambulatory Visit | Attending: Anesthesiology

## 2015-06-04 ENCOUNTER — Ambulatory Visit (HOSPITAL_COMMUNITY): Payer: Medicare Other | Admitting: Certified Registered Nurse Anesthetist

## 2015-06-04 DIAGNOSIS — Z7982 Long term (current) use of aspirin: Secondary | ICD-10-CM | POA: Diagnosis not present

## 2015-06-04 DIAGNOSIS — G8929 Other chronic pain: Secondary | ICD-10-CM | POA: Insufficient documentation

## 2015-06-04 DIAGNOSIS — K219 Gastro-esophageal reflux disease without esophagitis: Secondary | ICD-10-CM | POA: Insufficient documentation

## 2015-06-04 DIAGNOSIS — M961 Postlaminectomy syndrome, not elsewhere classified: Secondary | ICD-10-CM | POA: Diagnosis not present

## 2015-06-04 DIAGNOSIS — M1990 Unspecified osteoarthritis, unspecified site: Secondary | ICD-10-CM | POA: Diagnosis not present

## 2015-06-04 DIAGNOSIS — M5416 Radiculopathy, lumbar region: Secondary | ICD-10-CM | POA: Insufficient documentation

## 2015-06-04 DIAGNOSIS — Z87891 Personal history of nicotine dependence: Secondary | ICD-10-CM | POA: Insufficient documentation

## 2015-06-04 DIAGNOSIS — Z419 Encounter for procedure for purposes other than remedying health state, unspecified: Secondary | ICD-10-CM

## 2015-06-04 HISTORY — PX: SPINAL CORD STIMULATOR INSERTION: SHX5378

## 2015-06-04 SURGERY — INSERTION, SPINAL CORD STIMULATOR, LUMBAR
Anesthesia: Monitor Anesthesia Care

## 2015-06-04 MED ORDER — FENTANYL CITRATE (PF) 250 MCG/5ML IJ SOLN
INTRAMUSCULAR | Status: AC
  Filled 2015-06-04: qty 5

## 2015-06-04 MED ORDER — HYDROCODONE-ACETAMINOPHEN 10-325 MG PO TABS: 1.0000 | ORAL_TABLET | Freq: Four times a day (QID) | ORAL | Status: DC | PRN

## 2015-06-04 MED ORDER — NEOSTIGMINE METHYLSULFATE 10 MG/10ML IV SOLN
INTRAVENOUS | Status: AC
  Filled 2015-06-04: qty 1

## 2015-06-04 MED ORDER — SODIUM CHLORIDE 0.9 % IJ SOLN
INTRAMUSCULAR | Status: AC
  Filled 2015-06-04: qty 10

## 2015-06-04 MED ORDER — HYDROMORPHONE HCL 1 MG/ML IJ SOLN: 0.2500 mg | INTRAMUSCULAR | Status: DC | PRN

## 2015-06-04 MED ORDER — CEPHALEXIN 500 MG PO CAPS: 500.0000 mg | ORAL_CAPSULE | Freq: Three times a day (TID) | ORAL | Status: DC

## 2015-06-04 MED ORDER — ARTIFICIAL TEARS OP OINT
TOPICAL_OINTMENT | OPHTHALMIC | Status: AC
Start: 2015-06-04 — End: 2015-06-04
  Filled 2015-06-04: qty 3.5

## 2015-06-04 MED ORDER — 0.9 % SODIUM CHLORIDE (POUR BTL) OPTIME
TOPICAL | Status: DC | PRN
  Administered 2015-06-04: 1000 mL

## 2015-06-04 MED ORDER — ONDANSETRON HCL 4 MG/2ML IJ SOLN
INTRAMUSCULAR | Status: AC
  Filled 2015-06-04: qty 2

## 2015-06-04 MED ORDER — ROCURONIUM BROMIDE 100 MG/10ML IV SOLN
INTRAVENOUS | Status: DC | PRN
  Administered 2015-06-04: 5 mg via INTRAVENOUS

## 2015-06-04 MED ORDER — BUPIVACAINE-EPINEPHRINE (PF) 0.5% -1:200000 IJ SOLN
INTRAMUSCULAR | Status: DC | PRN
  Administered 2015-06-04: 18 mL via PERINEURAL
  Administered 2015-06-04: 12 mL via PERINEURAL

## 2015-06-04 MED ORDER — OXYCODONE HCL 5 MG PO TABS
5.0000 mg | ORAL_TABLET | Freq: Once | ORAL | Status: AC
  Administered 2015-06-04: 5 mg via ORAL

## 2015-06-04 MED ORDER — MIDAZOLAM HCL 2 MG/2ML IJ SOLN
INTRAMUSCULAR | Status: AC
  Filled 2015-06-04: qty 2

## 2015-06-04 MED ORDER — BACITRACIN-NEOMYCIN-POLYMYXIN OINTMENT TUBE
TOPICAL_OINTMENT | CUTANEOUS | Status: DC | PRN
Start: 2015-06-04 — End: 2015-06-04
  Administered 2015-06-04: 1 via TOPICAL

## 2015-06-04 MED ORDER — GLYCOPYRROLATE 0.2 MG/ML IJ SOLN
INTRAMUSCULAR | Status: AC
  Filled 2015-06-04: qty 3

## 2015-06-04 MED ORDER — LIDOCAINE HCL (CARDIAC) 20 MG/ML IV SOLN
INTRAVENOUS | Status: AC
  Filled 2015-06-04: qty 10

## 2015-06-04 MED ORDER — MIDAZOLAM HCL 5 MG/5ML IJ SOLN
INTRAMUSCULAR | Status: DC | PRN
  Administered 2015-06-04: 1 mg via INTRAVENOUS

## 2015-06-04 MED ORDER — SUCCINYLCHOLINE CHLORIDE 20 MG/ML IJ SOLN
INTRAMUSCULAR | Status: DC | PRN
  Administered 2015-06-04: 100 mg via INTRAVENOUS

## 2015-06-04 MED ORDER — PROMETHAZINE HCL 25 MG/ML IJ SOLN: 6.2500 mg | INTRAMUSCULAR | Status: DC | PRN

## 2015-06-04 MED ORDER — SODIUM CHLORIDE 0.9 % IR SOLN
Status: DC | PRN
  Administered 2015-06-04: 07:00:00

## 2015-06-04 MED ORDER — PROPOFOL 10 MG/ML IV BOLUS
INTRAVENOUS | Status: DC | PRN
  Administered 2015-06-04: 130 mg via INTRAVENOUS
  Administered 2015-06-04: 20 mg via INTRAVENOUS
  Administered 2015-06-04: 50 mg via INTRAVENOUS

## 2015-06-04 MED ORDER — LIDOCAINE HCL (CARDIAC) 20 MG/ML IV SOLN
INTRAVENOUS | Status: DC | PRN
  Administered 2015-06-04: 60 mg via INTRATRACHEAL

## 2015-06-04 MED ORDER — SUCCINYLCHOLINE CHLORIDE 20 MG/ML IJ SOLN
INTRAMUSCULAR | Status: AC
  Filled 2015-06-04: qty 2

## 2015-06-04 MED ORDER — OXYCODONE HCL 5 MG PO TABS
ORAL_TABLET | ORAL | Status: AC
  Filled 2015-06-04: qty 1

## 2015-06-04 MED ORDER — LACTATED RINGERS IV SOLN
INTRAVENOUS | Status: DC | PRN
  Administered 2015-06-04: 08:00:00 via INTRAVENOUS

## 2015-06-04 MED ORDER — FENTANYL CITRATE (PF) 250 MCG/5ML IJ SOLN
INTRAMUSCULAR | Status: DC | PRN
  Administered 2015-06-04 (×3): 50 ug via INTRAVENOUS

## 2015-06-04 MED ORDER — PROPOFOL 10 MG/ML IV BOLUS
INTRAVENOUS | Status: AC
  Filled 2015-06-04: qty 40

## 2015-06-04 MED ORDER — ROCURONIUM BROMIDE 50 MG/5ML IV SOLN
INTRAVENOUS | Status: AC
  Filled 2015-06-04: qty 2

## 2015-06-04 MED ORDER — CEFAZOLIN SODIUM-DEXTROSE 2-3 GM-% IV SOLR
2.0000 g | INTRAVENOUS | Status: AC
  Administered 2015-06-04: 2 g via INTRAVENOUS
  Filled 2015-06-04: qty 50

## 2015-06-04 MED ORDER — EPHEDRINE SULFATE 50 MG/ML IJ SOLN
INTRAMUSCULAR | Status: AC
  Filled 2015-06-04: qty 2

## 2015-06-04 SURGICAL SUPPLY — 64 items
BAG DECANTER FOR FLEXI CONT (MISCELLANEOUS) ×2 IMPLANT
BENZOIN TINCTURE PRP APPL 2/3 (GAUZE/BANDAGES/DRESSINGS) IMPLANT
BINDER ABDOMINAL 12 ML 46-62 (SOFTGOODS) ×2 IMPLANT
BLADE CLIPPER SURG (BLADE) IMPLANT
CHARGER KIT ×2 IMPLANT
CHARGER KIT (Stimulator) ×2 IMPLANT
CHLORAPREP W/TINT 26ML (MISCELLANEOUS) ×2 IMPLANT
CLIP TI WIDE RED SMALL 6 (CLIP) IMPLANT
DRAPE C-ARM 42X72 X-RAY (DRAPES) ×2 IMPLANT
DRAPE C-ARMOR (DRAPES) ×2 IMPLANT
DRAPE LAPAROTOMY 100X72X124 (DRAPES) ×2 IMPLANT
DRAPE POUCH INSTRU U-SHP 10X18 (DRAPES) ×2 IMPLANT
DRAPE SURG 17X23 STRL (DRAPES) ×2 IMPLANT
DRSG OPSITE POSTOP 3X4 (GAUZE/BANDAGES/DRESSINGS) ×2 IMPLANT
DRSG OPSITE POSTOP 4X6 (GAUZE/BANDAGES/DRESSINGS) ×2 IMPLANT
ELECT REM PT RETURN 9FT ADLT (ELECTROSURGICAL) ×2
ELECTRODE REM PT RTRN 9FT ADLT (ELECTROSURGICAL) ×1 IMPLANT
GAUZE SPONGE 4X4 16PLY XRAY LF (GAUZE/BANDAGES/DRESSINGS) ×2 IMPLANT
GLOVE BIOGEL PI IND STRL 7.5 (GLOVE) ×1 IMPLANT
GLOVE BIOGEL PI INDICATOR 7.5 (GLOVE) ×1
GLOVE ECLIPSE 7.5 STRL STRAW (GLOVE) ×2 IMPLANT
GLOVE EXAM NITRILE LRG STRL (GLOVE) IMPLANT
GLOVE EXAM NITRILE MD LF STRL (GLOVE) IMPLANT
GLOVE EXAM NITRILE XL STR (GLOVE) IMPLANT
GLOVE EXAM NITRILE XS STR PU (GLOVE) IMPLANT
GLOVE INDICATOR 7.0 STRL GRN (GLOVE) ×4 IMPLANT
GLOVE INDICATOR 7.5 STRL GRN (GLOVE) ×2 IMPLANT
GOWN STRL REUS W/ TWL LRG LVL3 (GOWN DISPOSABLE) ×2 IMPLANT
GOWN STRL REUS W/ TWL XL LVL3 (GOWN DISPOSABLE) IMPLANT
GOWN STRL REUS W/TWL 2XL LVL3 (GOWN DISPOSABLE) IMPLANT
GOWN STRL REUS W/TWL LRG LVL3 (GOWN DISPOSABLE) ×2
GOWN STRL REUS W/TWL XL LVL3 (GOWN DISPOSABLE)
KIT ANCHOR LEAD NEUROSTIM N300 (Stimulator) ×1 IMPLANT
KIT BASIN OR (CUSTOM PROCEDURE TRAY) ×2 IMPLANT
KIT LEAD NEUROSTIM 50 BLUE (Stimulator) ×2 IMPLANT
KIT NEUROSTIMULATOR SENZA (Stimulator) ×2 IMPLANT
KIT ROOM TURNOVER OR (KITS) ×2 IMPLANT
LEAD ANCHOR KIT N300 (Stimulator) ×1 IMPLANT
LEAD KIT 50CMX5MM (Stimulator) ×2 IMPLANT
LIQUID BAND (GAUZE/BANDAGES/DRESSINGS) ×2 IMPLANT
NEEDLE 18GX1X1/2 (RX/OR ONLY) (NEEDLE) IMPLANT
NEEDLE HYPO 25X1 1.5 SAFETY (NEEDLE) ×2 IMPLANT
NS IRRIG 1000ML POUR BTL (IV SOLUTION) ×2 IMPLANT
PACK LAMINECTOMY NEURO (CUSTOM PROCEDURE TRAY) ×2 IMPLANT
PAD ARMBOARD 7.5X6 YLW CONV (MISCELLANEOUS) ×2 IMPLANT
PATEINT REMOTE KIT (Stimulator) ×1 IMPLANT
PATIENT REMOTE KIT ×2 IMPLANT
PATIENT REMOTE KIT (Stimulator) ×1 IMPLANT
SPONGE LAP 4X18 X RAY DECT (DISPOSABLE) ×2 IMPLANT
SPONGE SURGIFOAM ABS GEL SZ50 (HEMOSTASIS) IMPLANT
STAPLER SKIN PROX WIDE 3.9 (STAPLE) ×2 IMPLANT
STRIP CLOSURE SKIN 1/2X4 (GAUZE/BANDAGES/DRESSINGS) IMPLANT
SUT MNCRL AB 4-0 PS2 18 (SUTURE) IMPLANT
SUT SILK 0 (SUTURE) ×1
SUT SILK 0 MO-6 18XCR BRD 8 (SUTURE) ×1 IMPLANT
SUT SILK 0 TIES 10X30 (SUTURE) IMPLANT
SUT SILK 2 0 TIES 10X30 (SUTURE) IMPLANT
SUT VIC AB 2-0 CP2 18 (SUTURE) ×4 IMPLANT
SYR EPIDURAL 5ML GLASS (SYRINGE) ×2 IMPLANT
SYRINGE 10CC LL (SYRINGE) IMPLANT
TOWEL OR 17X24 6PK STRL BLUE (TOWEL DISPOSABLE) ×2 IMPLANT
TOWEL OR 17X26 10 PK STRL BLUE (TOWEL DISPOSABLE) ×2 IMPLANT
WATER STERILE IRR 1000ML POUR (IV SOLUTION) ×2 IMPLANT
YANKAUER SUCT BULB TIP NO VENT (SUCTIONS) ×2 IMPLANT

## 2015-06-04 NOTE — Discharge Instructions (Signed)
Dr. Maryjean Ka Post-Op Orders   Ice Pack - 20 minutes on (in a pillow case), and 20 minutes off. Wear the ice pack UNDER the binder.  Follow up in office, they will call you for an appointment in 10 days to 2 weeks.  Increase activity gradually.    No lifting anything heavier than a gallon of milk (10 pounds) until seen in the office.  Advance diet slowly as tolerated.  Dressing care:  Keep dressing dry for 3 days, and on Post-op day 4, may shower.  Call for fever, drainage, and redness.  No swimming or bathing in a bathtub (do not get into standing water).

## 2015-06-04 NOTE — Transfer of Care (Signed)
Immediate Anesthesia Transfer of Care Note  Patient: Carolyn Steele  Procedure(s) Performed: Procedure(s): LUMBAR SPINAL CORD STIMULATOR INSERTION (N/A)  Patient Location: PACU  Anesthesia Type:General  Level of Consciousness: sedated  Airway & Oxygen Therapy: Patient Spontanous Breathing and Patient connected to face mask oxygen  Post-op Assessment: Report given to RN and Post -op Vital signs reviewed and stable  Post vital signs: Reviewed and stable  Last Vitals:  Filed Vitals:   06/04/15 0626 06/04/15 0638  BP: 158/71   Pulse: 77   Temp:  36.5 C  Resp: 20     Complications: No apparent anesthesia complications

## 2015-06-04 NOTE — Anesthesia Procedure Notes (Signed)
Procedure Name: Intubation Date/Time: 06/04/2015 8:01 AM Performed by: Maude Leriche D Pre-anesthesia Checklist: Patient identified, Emergency Drugs available, Suction available, Patient being monitored and Timeout performed Patient Re-evaluated:Patient Re-evaluated prior to inductionOxygen Delivery Method: Circle system utilized Preoxygenation: Pre-oxygenation with 100% oxygen Intubation Type: IV induction Ventilation: Mask ventilation without difficulty Laryngoscope Size: Miller and 2 Grade View: Grade I Tube type: Oral Tube size: 7.5 mm Number of attempts: 1 Airway Equipment and Method: Stylet Placement Confirmation: ETT inserted through vocal cords under direct vision,  positive ETCO2 and breath sounds checked- equal and bilateral Secured at: 21 cm Tube secured with: Tape Dental Injury: Teeth and Oropharynx as per pre-operative assessment

## 2015-06-04 NOTE — Op Note (Addendum)
PREOP DX: 1) lumbago  2) lumbar radiculopathy  3) lumbar post-laminectomy syndrome  4) chronic pain  POSTOP DX: 1) lumbago  2) lumbar radiculopathy  3) lumbar post-laminectomy syndrome  4) chronic pain  PROCEDURES PERFORMED:1) intraop fluoro 2) placement of 2 8 contact Nevro leads 3) placement of Nevro H10 SCS generator  SURGEON:Cara Aguino  ASSISTANT: NONE  ANESTHESIA: GETA EBL: <20cc  DESCRIPTION OF PROCEDURE: After a discussion of risks, benefits and alternatives, informed consent was obtained. The patient was taken to the OR, turned prone onto a Jackson table, all pressure points padded, SCD's placed, and an adequate plane of anesthesia induced. A timeout was taken to verify the correct patient, position, personnel, availability of appropriate equipment, and administration of perioperative antibiotics.  The thoracic and lumbar areas were widely prepped with chloraprep and draped into a sterile field. Fluoroscopy was used to plan a right paramedian incision at the L1-L3 levels, and an incision made with a 10 blade and carried down to the dorsolumbar fascia with the bovie and blunt dissection. Retractors were placed and a 14g [employer] tuohy needle placed into the epidural space at the T12-L1 interspace using biplanar fluoro and loss-of-resistance technique. The needle was aspirated without any return of fluid. A Nevro 8 contact lead was introduced and under live AP fluoro advanced until the distal-most contact overlay the superior aspect of the T8 vertebral body shadow with the rest of the contacts distributed over the T8 and T9 vertebral bodies in a position approximate but immediately left of anatomic midline. A second Nevro lead was placed just right of the initial lead, and staggered so that the distal most contact overlay the midportion of the T9 vertebral body, using the same technique. 0 silk sutures were placed in the fascia adjacent to the needles. The needles and stylets  were removed under fluoroscopy with no lead migration noted. Leads were then fixed to the fascia with Clik anchors; repeat images were obtained to verify that there had been no lead migration.  The incision was inspected and hemostasis obtained with the bipolar cautery.  Attention was then turned to creation of a subcutaneous pocket. At the right flank, a 3 cm incision was made with a 10 blade and using the bovie and blunt dissection a pocket of size appropriate to place a SCS generator. The pocket was trialed, and found to be of adequate size. The pocket was inspected for hemostasis, which was found to be excellent. Using reverse seldinger technique, the leads were tunneled to the pocket site, and the leads inserted into the SCS generator. Impedances were checked, and all found to be excellent. The leads were then all fixed into position with a self-torquing wrench. The wiring was all carefully coiled, placed behind the generator and placed in the pocket.  Both incisions were copiously irrigated with bacitracin-containing irrigation. The lumbar incision was closed in 2 deep layers of interrupted 2-0 vicryl and the skin closed with staples The pocket incision was closed with a deeper layer of 2-0 vicryl interrupted sutures, and the skin closed with staples. Antibiotic ointment and sterile dressings were applied.  Needle, sponge, and instrument counts were correct x2 at the end of the case.    The patient was then carefully awakened from anesthesia, turned supine, an abdominal binder placed, and the patient taken to the recovery room where she underwent complex spinal cord stimulator programming.  COMPLICATIONS: NONE  CONDITION: Stable throughout the course of the procedure and immediately afterward  DISPOSITION: discharge to home,  with antibiotics and pain medicine. Discussed care with the patient and her family. Followup in clinic will be scheduled in 10-14 days.

## 2015-06-04 NOTE — Anesthesia Postprocedure Evaluation (Signed)
Anesthesia Post Note  Patient: Carolyn Steele  Procedure(s) Performed: Procedure(s) (LRB): LUMBAR SPINAL CORD STIMULATOR INSERTION (N/A)  Patient location during evaluation: PACU Anesthesia Type: General Level of consciousness: awake and alert Pain management: pain level controlled Vital Signs Assessment: post-procedure vital signs reviewed and stable Respiratory status: spontaneous breathing, nonlabored ventilation, respiratory function stable and patient connected to nasal cannula oxygen Cardiovascular status: blood pressure returned to baseline and stable Postop Assessment: no signs of nausea or vomiting Anesthetic complications: no    Last Vitals:  Filed Vitals:   06/04/15 1015 06/04/15 1023  BP:  149/70  Pulse: 86 82  Temp:    Resp: 13 14    Last Pain:  Filed Vitals:   06/04/15 1026  PainSc: 0-No pain                 Valentine Barney,JAMES TERRILL

## 2015-06-04 NOTE — Anesthesia Preprocedure Evaluation (Addendum)
Anesthesia Evaluation  Patient identified by MRN, date of birth, ID band Patient awake    Reviewed: Allergy & Precautions, NPO status , Patient's Chart, lab work & pertinent test results  Airway Mallampati: II  TM Distance: >3 FB Neck ROM: Full    Dental  (+) Lower Dentures, Upper Dentures   Pulmonary neg pulmonary ROS, former smoker,    breath sounds clear to auscultation       Cardiovascular negative cardio ROS   Rhythm:Regular Rate:Normal     Neuro/Psych negative neurological ROS     GI/Hepatic hiatal hernia, GERD  ,  Endo/Other    Renal/GU      Musculoskeletal  (+) Arthritis ,   Abdominal   Peds  Hematology   Anesthesia Other Findings   Reproductive/Obstetrics                            Anesthesia Physical Anesthesia Plan  ASA: II  Anesthesia Plan: MAC   Post-op Pain Management:    Induction: Intravenous  Airway Management Planned: Natural Airway  Additional Equipment:   Intra-op Plan:   Post-operative Plan: Extubation in OR  Informed Consent: I have reviewed the patients History and Physical, chart, labs and discussed the procedure including the risks, benefits and alternatives for the proposed anesthesia with the patient or authorized representative who has indicated his/her understanding and acceptance.   Dental advisory given  Plan Discussed with: CRNA, Surgeon and Anesthesiologist  Anesthesia Plan Comments:        Anesthesia Quick Evaluation

## 2015-06-08 ENCOUNTER — Encounter (HOSPITAL_COMMUNITY): Payer: Self-pay | Admitting: Anesthesiology

## 2015-10-27 ENCOUNTER — Other Ambulatory Visit: Payer: Self-pay | Admitting: Internal Medicine

## 2015-10-27 DIAGNOSIS — Z1231 Encounter for screening mammogram for malignant neoplasm of breast: Secondary | ICD-10-CM

## 2015-11-10 ENCOUNTER — Ambulatory Visit
Admission: RE | Admit: 2015-11-10 | Discharge: 2015-11-10 | Disposition: A | Discharge status: Home / Self Care | Payer: Medicare Other | Source: Ambulatory Visit | Attending: Internal Medicine | Admitting: Internal Medicine

## 2015-11-10 DIAGNOSIS — Z1231 Encounter for screening mammogram for malignant neoplasm of breast: Secondary | ICD-10-CM | POA: Insufficient documentation

## 2016-09-28 ENCOUNTER — Other Ambulatory Visit: Payer: Self-pay | Admitting: Internal Medicine

## 2016-09-28 DIAGNOSIS — Z1231 Encounter for screening mammogram for malignant neoplasm of breast: Secondary | ICD-10-CM

## 2016-10-17 ENCOUNTER — Inpatient Hospital Stay: Admission: RE | Admit: 2016-10-17 | Payer: Medicare Other | Source: Ambulatory Visit

## 2016-11-16 ENCOUNTER — Ambulatory Visit
Admission: RE | Admit: 2016-11-16 | Discharge: 2016-11-16 | Disposition: A | Discharge status: Home / Self Care | Payer: Medicare Other | Source: Ambulatory Visit | Attending: Internal Medicine | Admitting: Internal Medicine

## 2016-11-16 DIAGNOSIS — Z1231 Encounter for screening mammogram for malignant neoplasm of breast: Secondary | ICD-10-CM | POA: Diagnosis present

## 2016-11-29 IMAGING — RF DG LUMBAR SPINE 1V
1 series · 1 of 1 positions shown · non-contrast
Comparison: None.

CLINICAL DATA: Spinal cord stimulator insertion, fluoro time 12
minutes 44 seconds.

EXAM:
DG C-ARM 61-120 MIN; LUMBAR SPINE - 1 VIEW

[Series 1: run · 1 of 1 slices shown]
[im 1/1]
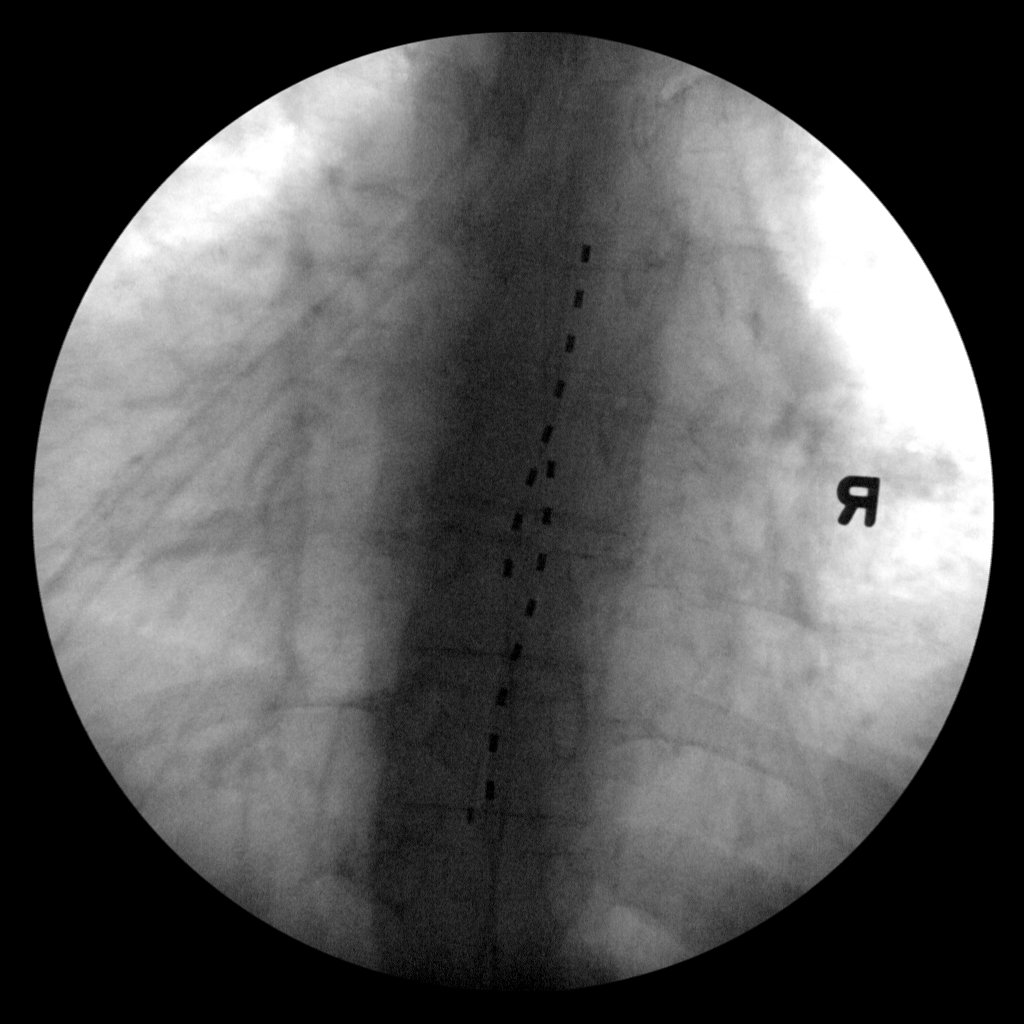

[1 of 1 positions shown; findings below may reference images not displayed]

FINDINGS: A single AP fluoro spot image is submitted. Two neurostimulator
electrodes project over the lower thoracic spinal canal.
IMPRESSION: Nerve stimulator electrodes projecting over the lower thoracic
spinal canal. The precise levels cannot be determined. No immediate
complication is observed.

## 2017-10-08 ENCOUNTER — Other Ambulatory Visit: Payer: Self-pay | Admitting: Internal Medicine

## 2017-10-08 DIAGNOSIS — Z1231 Encounter for screening mammogram for malignant neoplasm of breast: Secondary | ICD-10-CM

## 2017-11-19 ENCOUNTER — Ambulatory Visit
Admission: RE | Admit: 2017-11-19 | Discharge: 2017-11-19 | Disposition: A | Discharge status: Home / Self Care | Payer: Medicare Other | Source: Ambulatory Visit | Attending: Internal Medicine | Admitting: Internal Medicine

## 2017-11-19 DIAGNOSIS — Z1231 Encounter for screening mammogram for malignant neoplasm of breast: Secondary | ICD-10-CM | POA: Insufficient documentation

## 2018-10-11 ENCOUNTER — Other Ambulatory Visit: Payer: Self-pay | Admitting: Internal Medicine

## 2018-10-11 DIAGNOSIS — Z1231 Encounter for screening mammogram for malignant neoplasm of breast: Secondary | ICD-10-CM

## 2018-11-21 ENCOUNTER — Other Ambulatory Visit: Payer: Self-pay

## 2018-11-21 ENCOUNTER — Ambulatory Visit
Admission: RE | Admit: 2018-11-21 | Discharge: 2018-11-21 | Disposition: A | Discharge status: Home / Self Care | Payer: Medicare Other | Source: Ambulatory Visit | Attending: Internal Medicine | Admitting: Internal Medicine

## 2018-11-21 DIAGNOSIS — Z1231 Encounter for screening mammogram for malignant neoplasm of breast: Secondary | ICD-10-CM | POA: Insufficient documentation

## 2018-12-16 ENCOUNTER — Encounter: Payer: Self-pay | Admitting: Cardiovascular Disease

## 2019-01-10 ENCOUNTER — Other Ambulatory Visit: Payer: Self-pay

## 2019-01-10 ENCOUNTER — Emergency Department: Payer: Medicare Other

## 2019-01-10 ENCOUNTER — Inpatient Hospital Stay
Admission: EM | Admit: 2019-01-10 | Discharge: 2019-01-13 | DRG: 291 | Disposition: A | Discharge status: Home Health | Payer: Medicare Other | Attending: Internal Medicine | Admitting: Internal Medicine

## 2019-01-10 ENCOUNTER — Inpatient Hospital Stay: Payer: Medicare Other

## 2019-01-10 DIAGNOSIS — Z87891 Personal history of nicotine dependence: Secondary | ICD-10-CM

## 2019-01-10 DIAGNOSIS — K219 Gastro-esophageal reflux disease without esophagitis: Secondary | ICD-10-CM | POA: Diagnosis present

## 2019-01-10 DIAGNOSIS — E785 Hyperlipidemia, unspecified: Secondary | ICD-10-CM | POA: Diagnosis not present

## 2019-01-10 DIAGNOSIS — Z20828 Contact with and (suspected) exposure to other viral communicable diseases: Secondary | ICD-10-CM | POA: Diagnosis present

## 2019-01-10 DIAGNOSIS — J9601 Acute respiratory failure with hypoxia: Secondary | ICD-10-CM | POA: Diagnosis present

## 2019-01-10 DIAGNOSIS — E039 Hypothyroidism, unspecified: Secondary | ICD-10-CM | POA: Diagnosis present

## 2019-01-10 DIAGNOSIS — R945 Abnormal results of liver function studies: Secondary | ICD-10-CM

## 2019-01-10 DIAGNOSIS — R0602 Shortness of breath: Secondary | ICD-10-CM

## 2019-01-10 DIAGNOSIS — Z79890 Hormone replacement therapy (postmenopausal): Secondary | ICD-10-CM

## 2019-01-10 DIAGNOSIS — Z79899 Other long term (current) drug therapy: Secondary | ICD-10-CM | POA: Diagnosis not present

## 2019-01-10 DIAGNOSIS — Z7982 Long term (current) use of aspirin: Secondary | ICD-10-CM

## 2019-01-10 DIAGNOSIS — I110 Hypertensive heart disease with heart failure: Secondary | ICD-10-CM | POA: Diagnosis not present

## 2019-01-10 DIAGNOSIS — I5033 Acute on chronic diastolic (congestive) heart failure: Secondary | ICD-10-CM

## 2019-01-10 DIAGNOSIS — D649 Anemia, unspecified: Secondary | ICD-10-CM | POA: Diagnosis present

## 2019-01-10 DIAGNOSIS — R7989 Other specified abnormal findings of blood chemistry: Secondary | ICD-10-CM

## 2019-01-10 DIAGNOSIS — I5031 Acute diastolic (congestive) heart failure: Secondary | ICD-10-CM | POA: Diagnosis not present

## 2019-01-10 DIAGNOSIS — I248 Other forms of acute ischemic heart disease: Secondary | ICD-10-CM | POA: Diagnosis present

## 2019-01-10 DIAGNOSIS — E7800 Pure hypercholesterolemia, unspecified: Secondary | ICD-10-CM | POA: Diagnosis present

## 2019-01-10 DIAGNOSIS — I509 Heart failure, unspecified: Secondary | ICD-10-CM

## 2019-01-10 DIAGNOSIS — I361 Nonrheumatic tricuspid (valve) insufficiency: Secondary | ICD-10-CM | POA: Diagnosis not present

## 2019-01-10 DIAGNOSIS — I340 Nonrheumatic mitral (valve) insufficiency: Secondary | ICD-10-CM | POA: Diagnosis not present

## 2019-01-10 LAB — COMPREHENSIVE METABOLIC PANEL
ALT: 261 U/L — ABNORMAL HIGH (ref 0–44)
AST: 380 U/L — ABNORMAL HIGH (ref 15–41)
Albumin: 3.2 g/dL — ABNORMAL LOW (ref 3.5–5.0)
Alkaline Phosphatase: 73 U/L (ref 38–126)
Anion gap: 8 (ref 5–15)
BUN: 24 mg/dL — ABNORMAL HIGH (ref 8–23)
CO2: 33 mmol/L — ABNORMAL HIGH (ref 22–32)
Calcium: 8.4 mg/dL — ABNORMAL LOW (ref 8.9–10.3)
Chloride: 97 mmol/L — ABNORMAL LOW (ref 98–111)
Creatinine, Ser: 0.46 mg/dL (ref 0.44–1.00)
GFR calc Af Amer: >60 mL/min (ref >60)
GFR calc non Af Amer: >60 mL/min (ref >60)
Glucose, Bld: 84 mg/dL (ref 70–99)
Potassium: 4 mmol/L (ref 3.5–5.1)
Sodium: 138 mmol/L (ref 135–145)
Total Bilirubin: 0.6 mg/dL (ref 0.3–1.2)
Total Protein: 6.3 g/dL — ABNORMAL LOW (ref 6.5–8.1)

## 2019-01-10 LAB — CBC WITH DIFFERENTIAL/PLATELET
Abs Immature Granulocytes: 0.02 10*3/uL (ref 0.00–0.07)
Basophils Absolute: 0.1 10*3/uL (ref 0.0–0.1)
Basophils Relative: 1 %
Eosinophils Absolute: 0 10*3/uL (ref 0.0–0.5)
Eosinophils Relative: 0 %
HCT: 33.5 % — ABNORMAL LOW (ref 36.0–46.0)
Hemoglobin: 9.5 g/dL — ABNORMAL LOW (ref 12.0–15.0)
Immature Granulocytes: 0 %
Lymphocytes Relative: 16 %
Lymphs Abs: 1.2 10*3/uL (ref 0.7–4.0)
MCH: 23.1 pg — ABNORMAL LOW (ref 26.0–34.0)
MCHC: 28.4 g/dL — ABNORMAL LOW (ref 30.0–36.0)
MCV: 81.5 fL (ref 80.0–100.0)
Monocytes Absolute: 0.7 10*3/uL (ref 0.1–1.0)
Monocytes Relative: 10 %
Neutro Abs: 5.3 10*3/uL (ref 1.7–7.7)
Neutrophils Relative %: 73 %
Platelets: 268 10*3/uL (ref 150–400)
RBC: 4.11 MIL/uL (ref 3.87–5.11)
RDW: 16.2 % — ABNORMAL HIGH (ref 11.5–15.5)
WBC: 7.3 10*3/uL (ref 4.0–10.5)
nRBC: 0 % (ref 0.0–0.2)

## 2019-01-10 LAB — FOLATE: Folate: 40 ng/mL (ref >5.9)

## 2019-01-10 LAB — TROPONIN I (HIGH SENSITIVITY)
Troponin I (High Sensitivity): 328 ng/L (ref <18)
Troponin I (High Sensitivity): 424 ng/L (ref <18)

## 2019-01-10 LAB — IRON AND TIBC
Iron: 14 ug/dL — ABNORMAL LOW (ref 28–170)
Saturation Ratios: 3 % — ABNORMAL LOW (ref 10.4–31.8)
TIBC: 428 ug/dL (ref 250–450)
UIBC: 414 ug/dL

## 2019-01-10 LAB — APTT: aPTT: 32 seconds (ref 24–36)

## 2019-01-10 LAB — RETICULOCYTES
Immature Retic Fract: 24.7 % — ABNORMAL HIGH (ref 2.3–15.9)
RBC.: 4.5 MIL/uL (ref 3.87–5.11)
Retic Count, Absolute: 110 10*3/uL (ref 19.0–186.0)
Retic Ct Pct: 2.4 % (ref 0.4–3.1)

## 2019-01-10 LAB — SARS CORONAVIRUS 2 BY RT PCR (HOSPITAL ORDER, PERFORMED IN ~~LOC~~ HOSPITAL LAB): SARS Coronavirus 2: NEGATIVE

## 2019-01-10 LAB — PROTIME-INR
INR: 1.3 — ABNORMAL HIGH (ref 0.8–1.2)
Prothrombin Time: 16.1 seconds — ABNORMAL HIGH (ref 11.4–15.2)

## 2019-01-10 LAB — BRAIN NATRIURETIC PEPTIDE: B Natriuretic Peptide: 531 pg/mL — ABNORMAL HIGH (ref 0.0–100.0)

## 2019-01-10 LAB — FERRITIN: Ferritin: 12 ng/mL (ref 11–307)

## 2019-01-10 MED ORDER — ONDANSETRON HCL 4 MG PO TABS: 4.0000 mg | ORAL_TABLET | Freq: Four times a day (QID) | ORAL | Status: DC | PRN

## 2019-01-10 MED ORDER — LEVOTHYROXINE SODIUM 50 MCG PO TABS
150.0000 ug | ORAL_TABLET | Freq: Every day | ORAL | Status: DC
  Administered 2019-01-11 – 2019-01-13 (×3): 150 ug via ORAL
  Filled 2019-01-10 (×3): qty 1

## 2019-01-10 MED ORDER — LIDOCAINE 5 % EX PTCH
1.0000 | MEDICATED_PATCH | Freq: Two times a day (BID) | CUTANEOUS | Status: DC | PRN
  Filled 2019-01-10: qty 1

## 2019-01-10 MED ORDER — IPRATROPIUM-ALBUTEROL 0.5-2.5 (3) MG/3ML IN SOLN
3.0000 mL | Freq: Once | RESPIRATORY_TRACT | Status: AC
  Administered 2019-01-10: 3 mL via RESPIRATORY_TRACT
  Filled 2019-01-10: qty 3

## 2019-01-10 MED ORDER — TRAMADOL HCL 50 MG PO TABS
50.0000 mg | ORAL_TABLET | Freq: Four times a day (QID) | ORAL | Status: DC | PRN
  Administered 2019-01-10 – 2019-01-12 (×3): 50 mg via ORAL
  Filled 2019-01-10 (×3): qty 1

## 2019-01-10 MED ORDER — ACETAMINOPHEN 650 MG RE SUPP: 650.0000 mg | Freq: Four times a day (QID) | RECTAL | Status: DC | PRN

## 2019-01-10 MED ORDER — ONDANSETRON HCL 4 MG/2ML IJ SOLN: 4.0000 mg | Freq: Four times a day (QID) | INTRAMUSCULAR | Status: DC | PRN

## 2019-01-10 MED ORDER — GABAPENTIN 300 MG PO CAPS
300.0000 mg | ORAL_CAPSULE | Freq: Every day | ORAL | Status: DC
  Administered 2019-01-10 – 2019-01-13 (×4): 300 mg via ORAL
  Filled 2019-01-10 (×4): qty 1

## 2019-01-10 MED ORDER — LORATADINE 10 MG PO TABS
10.0000 mg | ORAL_TABLET | Freq: Every day | ORAL | Status: DC
  Administered 2019-01-10 – 2019-01-13 (×4): 10 mg via ORAL
  Filled 2019-01-10 (×4): qty 1

## 2019-01-10 MED ORDER — ASPIRIN EC 81 MG PO TBEC
81.0000 mg | DELAYED_RELEASE_TABLET | Freq: Every day | ORAL | Status: DC
  Administered 2019-01-11 – 2019-01-13 (×3): 81 mg via ORAL
  Filled 2019-01-10 (×3): qty 1

## 2019-01-10 MED ORDER — FUROSEMIDE 10 MG/ML IJ SOLN
40.0000 mg | Freq: Once | INTRAMUSCULAR | Status: AC
  Administered 2019-01-10: 40 mg via INTRAVENOUS
  Filled 2019-01-10: qty 4

## 2019-01-10 MED ORDER — ACETAMINOPHEN 325 MG PO TABS: 650.0000 mg | ORAL_TABLET | Freq: Four times a day (QID) | ORAL | Status: DC | PRN

## 2019-01-10 MED ORDER — PANTOPRAZOLE SODIUM 40 MG PO TBEC
40.0000 mg | DELAYED_RELEASE_TABLET | Freq: Every day | ORAL | Status: DC
  Administered 2019-01-10 – 2019-01-13 (×4): 40 mg via ORAL
  Filled 2019-01-10 (×4): qty 1

## 2019-01-10 MED ORDER — ENOXAPARIN SODIUM 40 MG/0.4ML ~~LOC~~ SOLN: 40.0000 mg | SUBCUTANEOUS | Status: DC

## 2019-01-10 MED ORDER — GUAIFENESIN ER 600 MG PO TB12
600.0000 mg | ORAL_TABLET | Freq: Two times a day (BID) | ORAL | Status: DC
  Administered 2019-01-10 – 2019-01-13 (×6): 600 mg via ORAL
  Filled 2019-01-10 (×6): qty 1

## 2019-01-10 MED ORDER — FUROSEMIDE 10 MG/ML IJ SOLN
40.0000 mg | Freq: Two times a day (BID) | INTRAMUSCULAR | Status: DC
  Administered 2019-01-11 – 2019-01-12 (×3): 40 mg via INTRAVENOUS
  Filled 2019-01-10 (×3): qty 4

## 2019-01-10 MED ORDER — HEPARIN (PORCINE) 25000 UT/250ML-% IV SOLN
850.0000 [IU]/h | INTRAVENOUS | Status: DC
  Administered 2019-01-10: 19:00:00 750 [IU]/h via INTRAVENOUS
  Filled 2019-01-10: qty 250

## 2019-01-10 MED ORDER — ASPIRIN 81 MG PO CHEW
324.0000 mg | CHEWABLE_TABLET | Freq: Once | ORAL | Status: AC
  Administered 2019-01-10: 18:00:00 324 mg via ORAL
  Filled 2019-01-10: qty 4

## 2019-01-10 MED ORDER — OMEGA-3-ACID ETHYL ESTERS 1 G PO CAPS
1.0000 g | ORAL_CAPSULE | Freq: Every day | ORAL | Status: DC
  Administered 2019-01-10 – 2019-01-13 (×4): 1 g via ORAL
  Filled 2019-01-10 (×4): qty 1

## 2019-01-10 MED ORDER — IOHEXOL 350 MG/ML SOLN
75.0000 mL | Freq: Once | INTRAVENOUS | Status: AC | PRN
  Administered 2019-01-10: 19:00:00 75 mL via INTRAVENOUS

## 2019-01-10 MED ORDER — HEPARIN BOLUS VIA INFUSION
3900.0000 [IU] | Freq: Once | INTRAVENOUS | Status: AC
  Administered 2019-01-10: 19:00:00 3900 [IU] via INTRAVENOUS
  Filled 2019-01-10: qty 3900

## 2019-01-10 NOTE — H&P (Signed)
Carolyn Steele NAME: Charlise Giovanetti    MR#:  [id number]  DATE OF BIRTH:  [birth year]  DATE OF ADMISSION:  01/10/2019  PRIMARY CARE PHYSICIAN: Albina Billet, MD   REQUESTING/REFERRING PHYSICIAN: Dr. Merlyn Lot  CHIEF COMPLAINT:   Chief Complaint  Patient presents with  . Shortness of Breath    HISTORY OF PRESENT ILLNESS:  Carolyn Steele  is a 73 y.o. female with a known history of GERD, osteoarthritis, depression, hyperlipidemia, hypothyroidism who presents to the hospital complaining of shortness of breath.  Patient says her shortness of breath has been persistent for the past week progressively getting worse.  She is now getting short of breath with just minimal exertion and minimal activity.  She admits to a cough which is nonproductive, denies any fevers, chills, nausea, vomiting.  She denies any paroxysmal nocturnal dyspnea or orthopnea.  She does states she has had mild lower extremity swelling but not significant weight gain.  She called EMS and when they arrived she was noted to be severely hypoxic with O2 sats in the 80s on room air and therefore brought to the ER for further evaluation.  Patient's work-up in the ER is more suggestive of new onset congestive heart failure.  Hospitalist services were therefore contacted for admission.  Patient denies any other associated symptoms and her COVID-19 test is negative.  PAST MEDICAL HISTORY:   Past Medical History:  Diagnosis Date  . Acid reflux   . Arthritis   . Depression   . History of depression   . Hypercholesteremia   . Thyroid disease hypo  . Wears glasses     PAST SURGICAL HISTORY:   Past Surgical History:  Procedure Laterality Date  . CHOLECYSTECTOMY    . COLONOSCOPY    . FOOT SURGERY Right   . HAND SURGERY Bilateral   . SHOULDER SURGERY Left   . SPINAL CORD STIMULATOR INSERTION N/A 06/04/2015   Procedure: LUMBAR SPINAL CORD STIMULATOR INSERTION;  Surgeon:  Clydell Hakim, MD;  Location: [facility];  Service: Neurosurgery;  Laterality: N/A;    SOCIAL HISTORY:   Social History   Tobacco Use  . Smoking status: Former Smoker    Packs/day: 1.00    Years: 40.00    Pack years: 40.00    Types: Cigarettes  . Smokeless tobacco: Never Used  . Tobacco comment: quit smoking approximately 8-10 years ago (05/28/2015)  Substance Use Topics  . Alcohol use: No    FAMILY HISTORY:   Family History  Problem Relation Age of Onset  . Lung cancer Mother   . Heart failure Father   . Breast cancer Neg Hx     DRUG ALLERGIES:   Allergies  Allergen Reactions  . Celebrex [Celecoxib] Swelling  . Mobic [Meloxicam] Swelling  . Adhesive [Tape] Itching and Rash    REVIEW OF SYSTEMS:   Review of Systems  Constitutional: Negative for fever and weight loss.  HENT: Negative for congestion, nosebleeds and tinnitus.   Eyes: Negative for blurred vision, double vision and redness.  Respiratory: Positive for shortness of breath. Negative for cough and hemoptysis.   Cardiovascular: Positive for leg swelling. Negative for chest pain, orthopnea and PND.  Gastrointestinal: Negative for abdominal pain, diarrhea, melena, nausea and vomiting.  Genitourinary: Negative for dysuria, hematuria and urgency.  Musculoskeletal: Negative for falls and joint pain.  Neurological: Negative for dizziness, tingling, sensory change, focal weakness, seizures, weakness and headaches.  Endo/Heme/Allergies: Negative for polydipsia.  Does not bruise/bleed easily.  Psychiatric/Behavioral: Negative for depression and memory loss. The patient is not nervous/anxious.     MEDICATIONS AT HOME:   Prior to Admission medications   Medication Sig Start Date End Date Taking? Authorizing Provider  alendronate (FOSAMAX) 70 MG tablet Take 70 mg by mouth once a week. Take with a full glass of water on an empty stomach. Every Sunday   Yes [provider]  aspirin 81 MG tablet Take 81 mg by  mouth daily.   Yes [provider]  Calcium Carb-Cholecalciferol (CALCIUM 600 + D PO) Take 1 tablet by mouth daily.   Yes [provider]  Cranberry-Vitamin C 140-100 MG CAPS Take 1 capsule by mouth daily.   Yes [provider]  fexofenadine (ALLEGRA) 180 MG tablet Take 180 mg by mouth daily.   Yes [provider]  gabapentin (NEURONTIN) 300 MG capsule Take 300 mg by mouth daily.   Yes [provider]  Glucosamine HCl 1500 MG TABS Take 1 tablet by mouth daily.   Yes [provider]  guaifenesin (MUCUS RELIEF) 400 MG TABS tablet Take 400 mg by mouth daily.   Yes [provider]  levothyroxine (SYNTHROID, LEVOTHROID) 150 MCG tablet Take 150 mcg by mouth daily before breakfast.   Yes [provider]  lidocaine (LIDODERM) 5 % Place 1 patch onto the skin every 12 (twelve) hours as needed (back pain). Remove & Discard patch within 12 hours or as directed by MD   Yes [provider]  Multiple Vitamins-Minerals (MULTIVITAMIN & MINERAL PO) Take 1 tablet by mouth daily.   Yes [provider]  Omega-3 Fatty Acids (FISH OIL) 1000 MG CAPS Take 1 capsule by mouth daily.   Yes [provider]  omeprazole (PRILOSEC) 40 MG capsule Take 40 mg by mouth daily.   Yes [provider]  pravastatin (PRAVACHOL) 40 MG tablet Take 40 mg by mouth daily.   Yes [provider]  traMADol (ULTRAM) 50 MG tablet Take 50 mg by mouth every 6 (six) hours as needed for moderate pain.    Yes [provider]      VITAL SIGNS:  Blood pressure (!) 146/64, pulse 92, temperature 99 F (37.2 C), temperature source Oral, resp. rate 15, height _0  (1.549 m), weight 80.7 kg, SpO2 98 %.  PHYSICAL EXAMINATION:  Physical Exam  GENERAL:  73 y.o.-year-old patient lying in bed in no acute distress.  EYES: Pupils equal, round, reactive to light and accommodation. No scleral icterus. Extraocular muscles intact.  HEENT:  Head atraumatic, normocephalic. Oropharynx and nasopharynx clear. No oropharyngeal erythema, moist oral mucosa  NECK:  Supple, no jugular venous distention. No thyroid enlargement, no tenderness.  LUNGS: Good air entry bilaterally, no rhonchi, wheezing, bilateral basilar rales, negative use of accessory muscles. CARDIOVASCULAR: S1, S2 RRR. No murmurs, rubs, gallops, clicks.  ABDOMEN: Soft, nontender, nondistended. Bowel sounds present. No organomegaly or mass.  EXTREMITIES: +1 edema b/l, No cyanosis, or clubbing. + 2 pedal & radial pulses b/l.   NEUROLOGIC: Cranial nerves II through XII are intact. No focal Motor or sensory deficits appreciated b/l PSYCHIATRIC: The patient is alert and oriented x 3.  SKIN: No obvious rash, lesion, or ulcer.   LABORATORY PANEL:   CBC Recent Labs  Lab 01/10/19 1636  WBC 7.3  HGB 9.5*  HCT 33.5*  PLT 268   ------------------------------------------------------------------------------------------------------------------  Chemistries  Recent Labs  Lab 01/10/19 1636  NA 138  K 4.0  CL 97*  CO2 33*  GLUCOSE 84  BUN 24*  CREATININE 0.46  CALCIUM 8.4*  AST 380*  ALT 261*  ALKPHOS 73  BILITOT 0.6   ------------------------------------------------------------------------------------------------------------------  Cardiac Enzymes No results for input(s): TROPONINI in the last 168 hours. ------------------------------------------------------------------------------------------------------------------  RADIOLOGY:  Ct Angio Chest Pe W And/or Wo Contrast  Result Date: 01/10/2019 CLINICAL DATA:  72 year old female with concern for pulmonary embolism. EXAM: CT ANGIOGRAPHY CHEST WITH CONTRAST TECHNIQUE: Multidetector CT imaging of the chest was performed using the standard protocol during bolus administration of intravenous contrast. Multiplanar CT image reconstructions and MIPs were obtained to evaluate the vascular anatomy. CONTRAST:  30m OMNIPAQUE  IOHEXOL 350 MG/ML SOLN COMPARISON:  Chest CT dated 01/16/2014 FINDINGS: Cardiovascular: Mild cardiomegaly. No pericardial effusion. There is mild atherosclerotic calcification of the thoracic aorta. No aneurysmal dilatation or dissection. The origins of the great vessels of the aortic arch appear patent as visualized. There is no CT evidence of pulmonary embolism. Mediastinum/Nodes: No hilar or mediastinal adenopathy. There is a small hiatal hernia. The esophagus is grossly unremarkable. No mediastinal fluid collection. Lungs/Pleura: Small right and probable trace left pleural effusion. There is moderate centrilobular emphysema. Subsegmental right lung base compressive atelectasis. Pneumonia is less likely but not excluded. Clinical correlation is recommended. There is no pneumothorax. The central airways are patent. Upper Abdomen: Cholecystectomy. Dilated visualized common bile duct. The visualized upper abdomen is otherwise unremarkable. Musculoskeletal: No acute osseous pathology. Spinal stimulator noted over the midthoracic spine. Review of the MIP images confirms the above findings. IMPRESSION: 1. No CT evidence of pulmonary embolism. 2. Small bilateral pleural effusions with subsegmental right lung base compressive atelectasis. Pneumonia is less likely but not excluded. Clinical correlation is recommended. 3. Mild cardiomegaly. Aortic Atherosclerosis (ICD10-I70.0) and Emphysema (ICD10-J43.9). Electronically Signed   By: AAnner CreteM.D.   On: 01/10/2019 19:43   Dg Chest Portable 1 View  Result Date: 01/10/2019 CLINICAL DATA:  73year old female with shortness of breath. Evaluate for pneumonia. EXAM: PORTABLE CHEST 1 VIEW COMPARISON:  Chest radiograph dated 09/20/2010 and CT dated 01/16/2014 FINDINGS: Diffuse vascular prominence may represent congestion. No focal consolidation, pleural effusion, pneumothorax. The cardiac silhouette is within normal limits. Atherosclerotic calcification of the aorta. No  acute osseous pathology. Spinal stimulator over the midthoracic spine noted. IMPRESSION: Probable vascular congestion.  No focal consolidation. Electronically Signed   By: AAnner CreteM.D.   On: 01/10/2019 17:43     IMPRESSION AND PLAN:   73y.o. female with a known history of GERD, osteoarthritis, depression, hyperlipidemia, hypothyroidism who presents to the hospital complaining of shortness of breath.  1.  Acute respiratory failure with hypoxia- suspected to be secondary to acute CHF. -Continue O2 supplementation, will treat patient's new onset CHF with IV diuresis with Lasix and follow I's and O's and daily weights, wean off oxygen as tolerated.  2.  CHF- this is new onset for the patient.  She presents with worsening shortness of breath, lower extremity edema with chest x-ray and CT chest findings suggestive of it. - We will check echocardiogram to rule out whether it systolic or diastolic. - Diurese the patient with IV Lasix, follow I's and O's and daily weights. - Get cardiology consult and [facility] 3.  Elevated troponin-suspected to be supply demand ischemia due to underlying CHF. - Follow troponin, empirically started on heparin in the ER, await further cardiology input. - Check echocardiogram, patient has no acute chest pain.  4.  Abnormal LFTs-etiology unclear.  Patient has no abdominal pain. -Questionable chronic passive congestion from the CHF.  Will follow LFTs, check a abdominal ultrasound limited.  5.  Hyperlipidemia- hold Pravachol given abnormal LFTs.  6.  Hypothyroidism-continue Synthroid.  7.  Anemia- this is a normocytic anemia.  I will check an anemia panel and iron studies.    All the records are reviewed and case discussed with ED provider. Management plans discussed with the patient, family and they are in agreement.  CODE STATUS: Full code  TOTAL TIME TAKING CARE OF THIS PATIENT: 45 minutes.    Henreitta Leber M.D on  01/10/2019 at 8:20 PM  Between 7am to 6pm - Pager - [phone]  After 6pm go to [email or web address] - password [email or web address] [facility]  [facility]  Office  [phone]  CC: Primary care physician; Albina Billet, MD

## 2019-01-10 NOTE — ED Notes (Signed)
ED TO INPATIENT HANDOFF REPORT  ED Nurse Name and Phone #: Hershal Eriksson [phone]   S Name/Age/Gender Carolyn Steele 73 y.o. female Room/Bed: ED11A/ED11A  Code Status   Code Status: Not on file  Home/SNF/Other Home Patient oriented to: self, place, time and situation Is this baseline? Yes   Triage Complete: Triage complete  Chief Complaint [facility]  Triage Note pt arrives via ems, lives at home. [facility] called [facility]s due to pt SOB. Fire department reported 74% on room air. Pt oxygen saturation in 60's with ems. 6LNC came up 98%. ems reports crackles in right lower lobe. no hx of oxygen use at home. Lives with daughter. Pt reports SOB x 3 week, with gradual increase. Reports feeling tire last few days, "past few days all I have wanted to do is sleep". Pt a&o x 4 on arrival and able to speak in complete sentences at this time without distress.   Baseline oxygen here 90% placed on 2L [place]   EMS vitals  t 98.3 BP 162/71 HR- 97  98% on 6 L    Allergies Allergies  Allergen Reactions  . Celebrex [Celecoxib] Swelling  . Mobic [Meloxicam] Swelling  . Adhesive [Tape] Itching and Rash    Level of Care/Admitting Diagnosis ED Disposition    ED Disposition Condition [facility]: [place] [[id number]]  Level of Care: Telemetry [5]  Covid Evaluation: Confirmed COVID Negative  Diagnosis: CHF (congestive heart failure) [facility]) [[id number]]  Admitting Physician: Henreitta Leber [[id number]]  Attending Physician: Henreitta Leber [[id number]]  Estimated length of stay: past midnight tomorrow  Certification:: I certify this patient will need inpatient services for at least 2 midnights  PT Class (Do Not Modify): Inpatient [101]  PT Acc Code (Do Not Modify): Private [1]       B Medical/Surgery History Past Medical History:  Diagnosis Date  . Acid reflux   . Arthritis   . Depression   . History of depression   . Hypercholesteremia   . Thyroid  disease hypo  . Wears glasses    Past Surgical History:  Procedure Laterality Date  . CHOLECYSTECTOMY    . COLONOSCOPY    . FOOT SURGERY Right   . HAND SURGERY Bilateral   . SHOULDER SURGERY Left   . SPINAL CORD STIMULATOR INSERTION N/A 06/04/2015   Procedure: LUMBAR SPINAL CORD STIMULATOR INSERTION;  Surgeon: Clydell Hakim, MD;  Location: [facility];  Service: Neurosurgery;  Laterality: N/A;     A IV Location/Drains/Wounds Patient Lines/Drains/Airways Status   Active Line/Drains/Airways    Name:   Placement date:   Placement time:   Site:   Days:   Peripheral IV 01/16/14 Left Forearm   01/16/14    1152    Forearm   1820   Peripheral IV 06/04/15 Left Hand   06/04/15    0715    Hand   1316   Peripheral IV 06/04/15 Right Hand   06/04/15    -    Hand   1316   Peripheral IV 01/10/19 Anterior;Right Forearm   01/10/19    1625    Forearm   less than 1   Peripheral IV 01/10/19 Left Antecubital   01/10/19    1855    Antecubital   less than 1   Incision (Closed) 06/04/15 Back Other (Comment)   06/04/15    0919     1316   Incision (Closed) 06/04/15 Flank Other (Comment)  06/04/15    0919     1316          Intake/Output Last 24 hours No intake or output data in the 24 hours ending 01/10/19 2025  Labs/Imaging Results for orders placed or performed during the hospital encounter of 01/10/19 (from the past 48 hour(s))  CBC with Differential/Platelet     Status: Abnormal   Collection Time: 01/10/19  4:36 PM  Result Value Ref Range   WBC 7.3 4.0 - 10.5 K/uL   RBC 4.11 3.87 - 5.11 MIL/uL   Hemoglobin 9.5 (L) 12.0 - 15.0 g/dL   HCT 33.5 (L) 36.0 - 46.0 %   MCV 81.5 80.0 - 100.0 fL   MCH 23.1 (L) 26.0 - 34.0 pg   MCHC 28.4 (L) 30.0 - 36.0 g/dL   RDW 16.2 (H) 11.5 - 15.5 %   Platelets 268 150 - 400 K/uL   nRBC 0.0 0.0 - 0.2 %   Neutrophils Relative % 73 %   Neutro Abs 5.3 1.7 - 7.7 K/uL   Lymphocytes Relative 16 %   Lymphs Abs 1.2 0.7 - 4.0 K/uL   Monocytes Relative 10 %   Monocytes  Absolute 0.7 0.1 - 1.0 K/uL   Eosinophils Relative 0 %   Eosinophils Absolute 0.0 0.0 - 0.5 K/uL   Basophils Relative 1 %   Basophils Absolute 0.1 0.0 - 0.1 K/uL   Immature Granulocytes 0 %   Abs Immature Granulocytes 0.02 0.00 - 0.07 K/uL    Comment: Performed at [facility], [address] [place], [place] [address]  Comprehensive metabolic panel     Status: Abnormal   Collection Time: 01/10/19  4:36 PM  Result Value Ref Range   Sodium 138 135 - 145 mmol/L   Potassium 4.0 3.5 - 5.1 mmol/L   Chloride 97 (L) 98 - 111 mmol/L   CO2 33 (H) 22 - 32 mmol/L   Glucose, Bld 84 70 - 99 mg/dL   BUN 24 (H) 8 - 23 mg/dL   Creatinine, Ser 0.46 0.44 - 1.00 mg/dL   Calcium 8.4 (L) 8.9 - 10.3 mg/dL   Total Protein 6.3 (L) 6.5 - 8.1 g/dL   Albumin 3.2 (L) 3.5 - 5.0 g/dL   AST 380 (H) 15 - 41 U/L   ALT 261 (H) 0 - 44 U/L   Alkaline Phosphatase 73 38 - 126 U/L   Total Bilirubin 0.6 0.3 - 1.2 mg/dL   GFR calc non Af Amer >60 >60 mL/min   GFR calc Af Amer >60 >60 mL/min   Anion gap 8 5 - 15    Comment: Performed at [facility], [address] [place], [place] [address]  Brain natriuretic peptide     Status: Abnormal   Collection Time: 01/10/19  4:36 PM  Result Value Ref Range   B Natriuretic Peptide 531.0 (H) 0.0 - 100.0 pg/mL    Comment: Performed at [facility], [address] [place], [place] [address]  Troponin I (High Sensitivity)     Status: Abnormal   Collection Time: 01/10/19  4:36 PM  Result Value Ref Range   Troponin I (High Sensitivity) 328 (HH) <18 ng/L    Comment: CRITICAL RESULT CALLED TO, READ BACK BY AND VERIFIED WITH LORRIE LEMONS 01/10/19 @ 2119  MLK (NOTE) Elevated high sensitivity troponin I (hsTnI) values and significant  changes across serial measurements may suggest ACS but many other  chronic and acute conditions are known to elevate hsTnI results.  Refer to the "Links" section for chest pain algorithms and additional   guidance. Performed at [facility], [address] [place], [place] [address]   APTT     Status: None   Collection Time: 01/10/19  4:36 PM  Result Value Ref Range   aPTT 32 24 - 36 seconds    Comment: Performed at [facility], [address] [place], [place] [address]  Protime-INR     Status: Abnormal   Collection Time: 01/10/19  4:36 PM  Result Value Ref Range   Prothrombin Time 16.1 (H) 11.4 - 15.2 seconds   INR 1.3 (H) 0.8 - 1.2    Comment: (NOTE) INR goal varies based on device and disease states. Performed at [facility], [address] [place], [place] [address]   SARS Coronavirus 2 [facility] order, Performed in [facility] hospital lab) Nasopharyngeal Nasopharyngeal Swab     Status: None   Collection Time: 01/10/19  4:50 PM   Specimen: Nasopharyngeal Swab  Result Value Ref Range   SARS Coronavirus 2 NEGATIVE NEGATIVE    Comment: (NOTE) If result is NEGATIVE SARS-CoV-2 target nucleic acids are NOT DETECTED. The SARS-CoV-2 RNA is generally detectable in upper and lower  respiratory specimens during the acute phase of infection. The lowest  concentration of SARS-CoV-2 viral copies this assay can detect is 250  copies / mL. A negative result does not preclude SARS-CoV-2 infection  and should not be used as the sole basis for treatment or other  patient management decisions.  A negative result may occur with  improper specimen collection / handling, submission of specimen other  than nasopharyngeal swab, presence of viral mutation(s) within the  areas targeted by this assay, and inadequate number of viral copies  (<250 copies / mL). A negative result must be combined with clinical  observations, patient history, and epidemiological information. If result is POSITIVE SARS-CoV-2 target nucleic acids are DETECTED. The SARS-CoV-2 RNA is generally detectable in upper and lower  respiratory specimens dur ing the acute phase of infection.   Positive  results are indicative of active infection with SARS-CoV-2.  Clinical  correlation with patient history and other diagnostic information is  necessary to determine patient infection status.  Positive results do  not rule out bacterial infection or co-infection with other viruses. If result is PRESUMPTIVE POSTIVE SARS-CoV-2 nucleic acids MAY BE PRESENT.   A presumptive positive result was obtained on the submitted specimen  and confirmed on repeat testing.  While 2019 novel coronavirus  (SARS-CoV-2) nucleic acids may be present in the submitted sample  additional confirmatory testing may be necessary for epidemiological  and / or clinical management purposes  to differentiate between  SARS-CoV-2 and other Sarbecovirus currently known to infect humans.  If clinically indicated additional testing with an alternate test  methodology [phone]) is advised. The SARS-CoV-2 RNA is generally  detectable in upper and lower respiratory sp ecimens during the acute  phase of infection. The expected result is Negative. Fact Sheet for Patients:  StrictlyIdeas.no Fact Sheet for Healthcare Providers: BankingDealers.co.za This test is not yet approved or cleared by the [place] FDA and has been authorized for detection and/or diagnosis of SARS-CoV-2 by FDA under an Emergency Use Authorization (EUA).  This EUA will remain in effect (meaning this test can be used) for the duration of the COVID-19 declaration under Section 564(b)(1) of the Act, 21 U.S.C. section 360bbb-3(b)(1), unless the authorization is terminated or revoked sooner. Performed at [facility],  [place], [place] [address]   Troponin I (High Sensitivity)     Status: Abnormal   Collection Time: 01/10/19  6:54 PM  Result Value Ref Range   Troponin I (High Sensitivity) 424 (HH) <18 ng/L    Comment: READ BACK AND VERIFIED WITH Terryl Niziolek 01/10/19 @  1946  MLK (NOTE) Elevated high sensitivity troponin I (hsTnI) values and significant  changes across serial measurements may suggest ACS but many other  chronic and acute conditions are known to elevate hsTnI results.  Refer to the "Links" section for chest pain algorithms and additional  guidance. Performed at [facility], [place], [place] [address]    Ct Angio Chest Pe W And/or Wo Contrast  Result Date: 01/10/2019 CLINICAL DATA:  73 year old female with concern for pulmonary embolism. EXAM: CT ANGIOGRAPHY CHEST WITH CONTRAST TECHNIQUE: Multidetector CT imaging of the chest was performed using the standard protocol during bolus administration of intravenous contrast. Multiplanar CT image reconstructions and MIPs were obtained to evaluate the vascular anatomy. CONTRAST:  107m OMNIPAQUE IOHEXOL 350 MG/ML SOLN COMPARISON:  Chest CT dated 01/16/2014 FINDINGS: Cardiovascular: Mild cardiomegaly. No pericardial effusion. There is mild atherosclerotic calcification of the thoracic aorta. No aneurysmal dilatation or dissection. The origins of the great vessels of the aortic arch appear patent as visualized. There is no CT evidence of pulmonary embolism. Mediastinum/Nodes: No hilar or mediastinal adenopathy. There is a small hiatal hernia. The esophagus is grossly unremarkable. No mediastinal fluid collection. Lungs/Pleura: Small right and probable trace left pleural effusion. There is moderate centrilobular emphysema. Subsegmental right lung base compressive atelectasis. Pneumonia is less likely but not excluded. Clinical correlation is recommended. There is no pneumothorax. The central airways are patent. Upper Abdomen: Cholecystectomy. Dilated visualized common bile duct. The visualized upper abdomen is otherwise unremarkable. Musculoskeletal: No acute osseous pathology. Spinal stimulator noted over the midthoracic spine. Review of the MIP images confirms the above findings.  IMPRESSION: 1. No CT evidence of pulmonary embolism. 2. Small bilateral pleural effusions with subsegmental right lung base compressive atelectasis. Pneumonia is less likely but not excluded. Clinical correlation is recommended. 3. Mild cardiomegaly. Aortic Atherosclerosis (ICD10-I70.0) and Emphysema (ICD10-J43.9). Electronically Signed   By: AAnner CreteM.D.   On: 01/10/2019 19:43   Dg Chest Portable 1 View  Result Date: 01/10/2019 CLINICAL DATA:  73year old female with shortness of breath. Evaluate for pneumonia. EXAM: PORTABLE CHEST 1 VIEW COMPARISON:  Chest radiograph dated 09/20/2010 and CT dated 01/16/2014 FINDINGS: Diffuse vascular prominence may represent congestion. No focal consolidation, pleural effusion, pneumothorax. The cardiac silhouette is within normal limits. Atherosclerotic calcification of the aorta. No acute osseous pathology. Spinal stimulator over the midthoracic spine noted. IMPRESSION: Probable vascular congestion.  No focal consolidation. Electronically Signed   By: AAnner CreteM.D.   On: 01/10/2019 17:43    Pending Labs Unresulted Labs (From admission, onward)    Start     Ordered   01/11/19 0300  Heparin level (unfractionated)  Once-Timed,   STAT     01/10/19 1921   01/11/19 0300  CBC  Once-Timed,   STAT     01/10/19 1921   Signed and Held  CBC  Tomorrow morning,   R     Signed and Held   Signed and Held  CBC  (enoxaparin (LOVENOX)    CrCl >/= 30 ml/min)  Once,   R    Comments: Baseline for enoxaparin therapy IF NOT ALREADY DRAWN.  Notify MD if PLT <  100 K.    Signed and Held   Signed and Held  Creatinine, serum  (enoxaparin (LOVENOX)    CrCl >/= 30 ml/min)  Once,   R    Comments: Baseline for enoxaparin therapy IF NOT ALREADY DRAWN.    Signed and Held   Signed and Held  Creatinine, serum  (enoxaparin (LOVENOX)    CrCl >/= 30 ml/min)  Weekly,   R    Comments: while on enoxaparin therapy    Signed and Held   Signed and Held  Comprehensive metabolic  panel  Tomorrow morning,   R     Signed and Held          Vitals/Pain Today's Vitals   01/10/19 1800 01/10/19 1900 01/10/19 1930 01/10/19 2000  BP: (!) 158/44 (!) 149/66 (!) 150/69 (!) 146/64  Pulse: 96 94 95 92  Resp: _0 Temp:      TempSrc:      SpO2: 99% 100% 99% 98%  Weight:      Height:      PainSc:   0-No pain     Isolation Precautions No active isolations  Medications Medications  heparin ADULT infusion 100 units/mL (25000 units/222m sodium chloride 0.45%) (750 Units/hr Intravenous New Bag/Given 01/10/19 1900)  ipratropium-albuterol (DUONEB) 0.5-2.5 (3) MG/3ML nebulizer solution 3 mL (3 mLs Nebulization Given 01/10/19 1728)  aspirin chewable tablet 324 mg (324 mg Oral Given 01/10/19 1803)  heparin bolus via infusion 3,900 Units (3,900 Units Intravenous Bolus from Bag 01/10/19 1900)  iohexol (OMNIPAQUE) 350 MG/ML injection 75 mL (75 mLs Intravenous Contrast Given 01/10/19 1915)  furosemide (LASIX) injection 40 mg (40 mg Intravenous Given 01/10/19 1959)    Mobility walks Low fall risk   Focused Assessments Cardiac Assessment Handoff:  Cardiac Rhythm: Normal sinus rhythm No results found for: CKTOTAL, CKMB, CKMBINDEX, TROPONINI No results found for: DDIMER Does the Patient currently have chest pain? No      R Recommendations: See Admitting Provider Note  Report given to:   Additional Notes:

## 2019-01-10 NOTE — ED Notes (Signed)
MD placed pt on room air to see how pt tolerated change. Pt oxygen saturation dropped down to 89%. At that time MD placed pt back on 2L [place]

## 2019-01-10 NOTE — ED Triage Notes (Signed)
pt arrives via ems, lives at home. [facility] called ems due to pt SOB. Fire department reported 74% on room air. Pt oxygen saturation in 60's with ems. 6LNC came up 98%. ems reports crackles in right lower lobe. no hx of oxygen use at home. Lives with daughter. Pt reports SOB x 3 week, with gradual increase. Reports feeling tire last few days, "past few days all I have wanted to do is sleep". Pt a&o x 4 on arrival and able to speak in complete sentences at this time without distress.   Baseline oxygen here 90% placed on 2L [place]   EMS vitals  t 98.3 BP 162/71 HR- 97  98% on 6 L [place]

## 2019-01-10 NOTE — ED Notes (Signed)
Dr.  Joan Mayans notified of elevated troponin 424.

## 2019-01-10 NOTE — ED Notes (Signed)
Date and time results received: 01/10/19   Test: Troponin Critical Value: 328  Name of Provider Notified: Quentin Cornwall  Orders Received? Or Actions Taken?: MD notified

## 2019-01-10 NOTE — ED Notes (Signed)
Report from [place], rn.

## 2019-01-10 NOTE — Progress Notes (Signed)
[facility] for heparin Indication: chest pain/ACS  Allergies  Allergen Reactions  . Celebrex [Celecoxib] Swelling  . Mobic [Meloxicam] Swelling  . Adhesive [Tape] Itching and Rash    Patient Measurements: Height: _0  (154.9 cm) Weight: 178 lb (80.7 kg) IBW/kg (Calculated) : 47.8 Heparin Dosing Weight: 66 kg  Vital Signs: Temp: 99 F (37.2 C) (09/11 1631) Temp Source: Oral (09/11 1631) BP: 149/66 (09/11 1900) Pulse Rate: 94 (09/11 1900)  Labs: Recent Labs    01/10/19 1636  HGB 9.5*  HCT 33.5*  PLT 268  APTT 32  LABPROT 16.1*  INR 1.3*  CREATININE 0.46  TROPONINIHS 328*    Estimated Creatinine Clearance: 61.2 mL/min (by C-G formula based on SCr of 0.46 mg/dL).   Medical History: Past Medical History:  Diagnosis Date  . Acid reflux   . Arthritis   . Depression   . History of depression   . Hypercholesteremia   . Thyroid disease hypo  . Wears glasses      Assessment: 73 year old female admitted with SOB. Troponin elevated. Pharmacy consulted for heparin for ACS.  Goal of Therapy:  Heparin level 0.3-0.7 units/ml Monitor platelets by anticoagulation protocol: Yes   Plan:  Heparin 3900 unit bolus followed by heparin drip at 750 units/hr. HL 9/12 at 0300. CBC with morning labs.  Tawnya Crook, PharmD 01/10/2019,7:13 PM

## 2019-01-10 NOTE — ED Notes (Signed)
Pt to ct scan.

## 2019-01-10 NOTE — ED Provider Notes (Signed)
[facility] Emergency Department Provider Note    None    (approximate)  I have reviewed the triage vital signs and the nursing notes.   HISTORY  Chief Complaint Shortness of Breath    HPI Carolyn Steele is a 73 y.o. female below his past medical history presents the ER for to 3 weeks of progressively worsening shortness of breath became more severe today.  She lives at home alone does not wear oxygen at home.  Family does have been sick with it nasal congestion but no one has tested positive for COVID-19.  She denies any chest pain.  No lower extremity swelling.  States over the past few days she has had worsening orthopnea.  Reportedly was at the clinic today and was found to be significantly hypoxic down to the 70s on room air.  Brought in on nonrebreather with good O2 sat.    Past Medical History:  Diagnosis Date  . Acid reflux   . Arthritis   . Depression   . History of depression   . Hypercholesteremia   . Thyroid disease hypo  . Wears glasses    Family History  Problem Relation Age of Onset  . Lung cancer Mother   . Heart failure Father   . Breast cancer Neg Hx    Past Surgical History:  Procedure Laterality Date  . CHOLECYSTECTOMY    . COLONOSCOPY    . FOOT SURGERY Right   . HAND SURGERY Bilateral   . SHOULDER SURGERY Left   . SPINAL CORD STIMULATOR INSERTION N/A 06/04/2015   Procedure: LUMBAR SPINAL CORD STIMULATOR INSERTION;  Surgeon: Clydell Hakim, MD;  Location: [place] [facility];  Service: Neurosurgery;  Laterality: N/A;   Patient Active Problem List   Diagnosis Date Noted  . CHF (congestive heart failure) ([place]) 01/10/2019      Prior to Admission medications   Medication Sig Start Date End Date Taking? Authorizing Provider  alendronate (FOSAMAX) 70 MG tablet Take 70 mg by mouth once a week. Take with a full glass of water on an empty stomach. Every Sunday   Yes [provider]  aspirin 81 MG tablet Take 81 mg by mouth  daily.   Yes [provider]  Calcium Carb-Cholecalciferol (CALCIUM 600 + D PO) Take 1 tablet by mouth daily.   Yes [provider]  Cranberry-Vitamin C 140-100 MG CAPS Take 1 capsule by mouth daily.   Yes [provider]  fexofenadine (ALLEGRA) 180 MG tablet Take 180 mg by mouth daily.   Yes [provider]  gabapentin (NEURONTIN) 300 MG capsule Take 300 mg by mouth daily.   Yes [provider]  Glucosamine HCl 1500 MG TABS Take 1 tablet by mouth daily.   Yes [provider]  guaifenesin (MUCUS RELIEF) 400 MG TABS tablet Take 400 mg by mouth daily.   Yes [provider]  levothyroxine (SYNTHROID, LEVOTHROID) 150 MCG tablet Take 150 mcg by mouth daily before breakfast.   Yes [provider]  lidocaine (LIDODERM) 5 % Place 1 patch onto the skin every 12 (twelve) hours as needed (back pain). Remove & Discard patch within 12 hours or as directed by MD   Yes [provider]  Multiple Vitamins-Minerals (MULTIVITAMIN & MINERAL PO) Take 1 tablet by mouth daily.   Yes [provider]  Omega-3 Fatty Acids (FISH OIL) 1000 MG CAPS Take 1 capsule by mouth daily.   Yes [provider]  omeprazole (PRILOSEC) 40 MG  capsule Take 40 mg by mouth daily.   Yes [provider]  pravastatin (PRAVACHOL) 40 MG tablet Take 40 mg by mouth daily.   Yes [provider]  traMADol (ULTRAM) 50 MG tablet Take 50 mg by mouth every 6 (six) hours as needed for moderate pain.    Yes [provider]    Allergies Celebrex [celecoxib], Mobic [meloxicam], and Adhesive [tape]    Social History Social History   Tobacco Use  . Smoking status: Former Smoker    Packs/day: 1.00    Years: 40.00    Pack years: 40.00    Types: Cigarettes  . Smokeless tobacco: Never Used  . Tobacco comment: quit smoking approximately 8-10 years ago (05/28/2015)  Substance Use Topics  . Alcohol use: No  . Drug use: No     Review of Systems Patient denies headaches, rhinorrhea, blurry vision, numbness, shortness of breath, chest pain, edema, cough, abdominal pain, nausea, vomiting, diarrhea, dysuria, fevers, rashes or hallucinations unless otherwise stated above in HPI. ____________________________________________   PHYSICAL EXAM:  VITAL SIGNS: Vitals:   01/10/19 1900 01/10/19 1930  BP: (!) 149/66 (!) 150/69  Pulse: 94 95  Resp: 13 17  Temp:    SpO2: 100% 99%    Constitutional: Alert and oriented.  Eyes: Conjunctivae are normal.  Head: Atraumatic. Nose: No congestion/rhinnorhea. Mouth/Throat: Mucous membranes are moist.   Neck: No stridor. Painless ROM.  Cardiovascular: Normal rate, regular rhythm. Grossly normal heart sounds.  Good peripheral circulation. Respiratory: tachypnea with bibasilar crackles and scattered wheeze Gastrointestinal: Soft and nontender. No distention. No abdominal bruits. No CVA tenderness. Genitourinary:  Musculoskeletal: No lower extremity tenderness nor edema.  No joint effusions. Neurologic:  Normal speech and language. No gross focal neurologic deficits are appreciated. No facial droop Skin:  Skin is warm, dry and intact. No rash noted. Psychiatric: Mood and affect are normal. Speech and behavior are normal.  ____________________________________________   LABS (all labs ordered are listed, but only abnormal results are displayed)  Results for orders placed or performed during the hospital encounter of 01/10/19 (from the past 24 hour(s))  CBC with Differential/Platelet     Status: Abnormal   Collection Time: 01/10/19  4:36 PM  Result Value Ref Range   WBC 7.3 4.0 - 10.5 K/uL   RBC 4.11 3.87 - 5.11 MIL/uL   Hemoglobin 9.5 (L) 12.0 - 15.0 g/dL   HCT 33.5 (L) 36.0 - 46.0 %   MCV 81.5 80.0 - 100.0 fL   MCH 23.1 (L) 26.0 - 34.0 pg   MCHC 28.4 (L) 30.0 - 36.0 g/dL   RDW 16.2 (H) 11.5 - 15.5 %   Platelets 268 150 - 400 K/uL   nRBC 0.0 0.0 - 0.2 %   Neutrophils  Relative % 73 %   Neutro Abs 5.3 1.7 - 7.7 K/uL   Lymphocytes Relative 16 %   Lymphs Abs 1.2 0.7 - 4.0 K/uL   Monocytes Relative 10 %   Monocytes Absolute 0.7 0.1 - 1.0 K/uL   Eosinophils Relative 0 %   Eosinophils Absolute 0.0 0.0 - 0.5 K/uL   Basophils Relative 1 %   Basophils Absolute 0.1 0.0 - 0.1 K/uL   Immature Granulocytes 0 %   Abs Immature Granulocytes 0.02 0.00 - 0.07 K/uL  Comprehensive metabolic panel     Status: Abnormal   Collection Time: 01/10/19  4:36 PM  Result Value Ref Range   Sodium 138 135 - 145 mmol/L   Potassium 4.0 3.5 - 5.1  mmol/L   Chloride 97 (L) 98 - 111 mmol/L   CO2 33 (H) 22 - 32 mmol/L   Glucose, Bld 84 70 - 99 mg/dL   BUN 24 (H) 8 - 23 mg/dL   Creatinine, Ser 0.46 0.44 - 1.00 mg/dL   Calcium 8.4 (L) 8.9 - 10.3 mg/dL   Total Protein 6.3 (L) 6.5 - 8.1 g/dL   Albumin 3.2 (L) 3.5 - 5.0 g/dL   AST 380 (H) 15 - 41 U/L   ALT 261 (H) 0 - 44 U/L   Alkaline Phosphatase 73 38 - 126 U/L   Total Bilirubin 0.6 0.3 - 1.2 mg/dL   GFR calc non Af Amer >60 >60 mL/min   GFR calc Af Amer >60 >60 mL/min   Anion gap 8 5 - 15  Brain natriuretic peptide     Status: Abnormal   Collection Time: 01/10/19  4:36 PM  Result Value Ref Range   B Natriuretic Peptide 531.0 (H) 0.0 - 100.0 pg/mL  Troponin I (High Sensitivity)     Status: Abnormal   Collection Time: 01/10/19  4:36 PM  Result Value Ref Range   Troponin I (High Sensitivity) 328 (HH) <18 ng/L  APTT     Status: None   Collection Time: 01/10/19  4:36 PM  Result Value Ref Range   aPTT 32 24 - 36 seconds  Protime-INR     Status: Abnormal   Collection Time: 01/10/19  4:36 PM  Result Value Ref Range   Prothrombin Time 16.1 (H) 11.4 - 15.2 seconds   INR 1.3 (H) 0.8 - 1.2  SARS Coronavirus 2 [facility] order, Performed in [place] hospital lab) Nasopharyngeal Nasopharyngeal Swab     Status: None   Collection Time: 01/10/19  4:50 PM   Specimen: Nasopharyngeal Swab  Result Value Ref Range   SARS Coronavirus 2  NEGATIVE NEGATIVE  Troponin I (High Sensitivity)     Status: Abnormal   Collection Time: 01/10/19  6:54 PM  Result Value Ref Range   Troponin I (High Sensitivity) 424 (HH) <18 ng/L   ____________________________________________  EKG My review and personal interpretation at Time: 16:31   Indication: sob  Rate:  95  Rhythm: sinus Axis:  normal Other: normal intervals, no stemi ____________________________________________  RADIOLOGY  I personally reviewed all radiographic images ordered to evaluate for the above acute complaints and reviewed radiology reports and findings.  These findings were personally discussed with the patient.  Please see medical record for radiology report.  ____________________________________________   PROCEDURES  Procedure(s) performed:  .Critical Care Performed by: Merlyn Lot, MD Authorized by: Merlyn Lot, MD   Critical care provider statement:    Critical care time (minutes):  30   Critical care time was exclusive of:  Separately billable procedures and treating other patients   Critical care was necessary to treat or prevent imminent or life-threatening deterioration of the following conditions:  Respiratory failure   Critical care was time spent personally by me on the following activities:  Development of treatment plan with patient or surrogate, discussions with consultants, evaluation of patient's response to treatment, examination of patient, obtaining history from patient or surrogate, ordering and performing treatments and interventions, ordering and review of laboratory studies, ordering and review of radiographic studies, pulse oximetry, re-evaluation of patient's condition and review of old charts      Critical Care performed: yes ____________________________________________   INITIAL IMPRESSION / ASSESSMENT AND PLAN / ED COURSE  Pertinent labs & imaging results that were available  during my care of the patient were  reviewed by me and considered in my medical decision making (see chart for details).   DDX: Asthma, copd, CHF, pna, ptx, malignancy, Pe, anemia   TYRONICA TRUXILLO is a 73 y.o. who presents to the ED with shortness of breath and evidence of acute hypoxic respiratory failure.  Nontoxic-appearing at this time denying any chest pain but does have obvious hypoxia.  EKG does not show any evidence of acute ischemia.  Chest x-ray does show evidence of probable early pulmonary vascular congestion but her hypoxia exceeds what I would expect for her chest x-ray.  Does have mildly elevated LFTs.  May be secondary to vascular congestion.  Found to have elevated troponin but given the extent of her hypoxia feel more likely demand ischemia in the setting of hypoxemia.  Will order CT angiogram to exclude PE.  Clinical Course as of Jan 09 2006  Fri Jan 10, 2019  2006 CT imaging is without any evidence of PE.  Not felt to be consistent with pneumonia.  Believe that her hypoxia and respiratory failure is secondary to CHF.  Have ordered a dose of IV Lasix.  Discussed with hospitalist for admission.  She remains pain-free.   [PR]    Clinical Course User Index [PR] Merlyn Lot, MD    The patient was evaluated in Emergency Department today for the symptoms described in the history of present illness. He/she was evaluated in the context of the global COVID-19 pandemic, which necessitated consideration that the patient might be at risk for infection with the SARS-CoV-2 virus that causes COVID-19. Institutional protocols and algorithms that pertain to the evaluation of patients at risk for COVID-19 are in a state of rapid change based on information released by regulatory bodies including the CDC and federal and state organizations. These policies and algorithms were followed during the patient's care in the ED.  As part of my medical decision making, I reviewed the following data within the electronic medical  record:  Nursing notes reviewed and incorporated, Labs reviewed, notes from prior ED visits and [place] Controlled Substance Database   ____________________________________________   FINAL CLINICAL IMPRESSION(S) / ED DIAGNOSES  Final diagnoses:  Acute respiratory failure with hypoxia (HCC)  SOB (shortness of breath)      NEW MEDICATIONS STARTED DURING THIS VISIT:  New Prescriptions   No medications on file     Note:  This document was prepared using Dragon voice recognition software and may include unintentional dictation errors.    Merlyn Lot, MD 01/10/19 2007

## 2019-01-10 NOTE — Progress Notes (Addendum)
Called theresa of ultrasound regarding the STAT ultrasound of the abdomen. As per theresa she spoke to Dr. Manuella Ghazi and and made him aware  she have not done ultrasound because she was busy at the moment. Clarene Critchley states she will get her ultrasound done tonight. Will continue to monitor.  Update 0635: Pt potassium at 3.3. Notify prime and talked to Dr. Sidney Ace and ordered 40 mEq potassium oral now and 40 mEq potassium in 2 hours.Dr. Sidney Ace ordered to check potassium level at 0945. Will notify incoming shift.Will continue to monitor.

## 2019-01-10 NOTE — ED Notes (Signed)
Pure wick placed for comfort and monitoring of I and O post lasix administration.

## 2019-01-11 ENCOUNTER — Inpatient Hospital Stay (HOSPITAL_COMMUNITY)
Admit: 2019-01-11 | Discharge: 2019-01-11 | Disposition: A | Discharge status: Home / Self Care | Payer: Medicare Other | Attending: Specialist | Admitting: Specialist

## 2019-01-11 DIAGNOSIS — D649 Anemia, unspecified: Secondary | ICD-10-CM

## 2019-01-11 DIAGNOSIS — I361 Nonrheumatic tricuspid (valve) insufficiency: Secondary | ICD-10-CM

## 2019-01-11 DIAGNOSIS — E785 Hyperlipidemia, unspecified: Secondary | ICD-10-CM

## 2019-01-11 DIAGNOSIS — I110 Hypertensive heart disease with heart failure: Principal | ICD-10-CM

## 2019-01-11 DIAGNOSIS — I5031 Acute diastolic (congestive) heart failure: Secondary | ICD-10-CM

## 2019-01-11 DIAGNOSIS — I340 Nonrheumatic mitral (valve) insufficiency: Secondary | ICD-10-CM

## 2019-01-11 LAB — COMPREHENSIVE METABOLIC PANEL
ALT: 246 U/L — ABNORMAL HIGH (ref 0–44)
AST: 260 U/L — ABNORMAL HIGH (ref 15–41)
Albumin: 3.2 g/dL — ABNORMAL LOW (ref 3.5–5.0)
Alkaline Phosphatase: 71 U/L (ref 38–126)
Anion gap: 10 (ref 5–15)
BUN: 14 mg/dL (ref 8–23)
CO2: 36 mmol/L — ABNORMAL HIGH (ref 22–32)
Calcium: 8.2 mg/dL — ABNORMAL LOW (ref 8.9–10.3)
Chloride: 94 mmol/L — ABNORMAL LOW (ref 98–111)
Creatinine, Ser: 0.43 mg/dL — ABNORMAL LOW (ref 0.44–1.00)
GFR calc Af Amer: >60 mL/min (ref >60)
GFR calc non Af Amer: >60 mL/min (ref >60)
Glucose, Bld: 78 mg/dL (ref 70–99)
Potassium: 3.3 mmol/L — ABNORMAL LOW (ref 3.5–5.1)
Sodium: 140 mmol/L (ref 135–145)
Total Bilirubin: 0.8 mg/dL (ref 0.3–1.2)
Total Protein: 6.4 g/dL — ABNORMAL LOW (ref 6.5–8.1)

## 2019-01-11 LAB — VITAMIN B12: Vitamin B-12: 1899 pg/mL — ABNORMAL HIGH (ref 180–914)

## 2019-01-11 LAB — CBC
HCT: 34 % — ABNORMAL LOW (ref 36.0–46.0)
Hemoglobin: 9.7 g/dL — ABNORMAL LOW (ref 12.0–15.0)
MCH: 23 pg — ABNORMAL LOW (ref 26.0–34.0)
MCHC: 28.5 g/dL — ABNORMAL LOW (ref 30.0–36.0)
MCV: 80.8 fL (ref 80.0–100.0)
Platelets: 262 10*3/uL (ref 150–400)
RBC: 4.21 MIL/uL (ref 3.87–5.11)
RDW: 16.4 % — ABNORMAL HIGH (ref 11.5–15.5)
WBC: 8.1 10*3/uL (ref 4.0–10.5)
nRBC: 0 % (ref 0.0–0.2)

## 2019-01-11 LAB — ECHOCARDIOGRAM COMPLETE
Height: 61 in
Weight: 2784 oz

## 2019-01-11 LAB — POTASSIUM: Potassium: 4.2 mmol/L (ref 3.5–5.1)

## 2019-01-11 LAB — HEPARIN LEVEL (UNFRACTIONATED)
Heparin Unfractionated: 0.33 IU/mL (ref 0.30–0.70)
Heparin Unfractionated: 0.24 IU/mL — ABNORMAL LOW (ref 0.30–0.70)

## 2019-01-11 MED ORDER — POTASSIUM CHLORIDE CRYS ER 20 MEQ PO TBCR
40.0000 meq | EXTENDED_RELEASE_TABLET | Freq: Once | ORAL | Status: AC
  Administered 2019-01-11: 09:00:00 40 meq via ORAL
  Filled 2019-01-11: qty 2

## 2019-01-11 MED ORDER — LOSARTAN POTASSIUM 25 MG PO TABS
25.0000 mg | ORAL_TABLET | Freq: Every day | ORAL | Status: DC
  Administered 2019-01-11 – 2019-01-13 (×3): 25 mg via ORAL
  Filled 2019-01-11 (×3): qty 1

## 2019-01-11 MED ORDER — HEPARIN BOLUS VIA INFUSION
1000.0000 [IU] | Freq: Once | INTRAVENOUS | Status: DC
  Filled 2019-01-11: qty 1000

## 2019-01-11 MED ORDER — POTASSIUM CHLORIDE CRYS ER 20 MEQ PO TBCR
40.0000 meq | EXTENDED_RELEASE_TABLET | Freq: Once | ORAL | Status: AC
  Administered 2019-01-11: 40 meq via ORAL
  Filled 2019-01-11: qty 2

## 2019-01-11 NOTE — Consult Note (Signed)
CARDIOLOGY CONSULT NOTE     Primary Care Physician: Albina Billet, MD Referring Physician:  Dr Verdell Carmine  Admit Date: 01/10/2019  Reason for consultation:  CHF  Carolyn Steele is a 73 y.o. female with a h/o longstanding tobacco, now quit who presents with progressive SOB.  She reports having worsening SOB over the past few weeks.  + cough.  She has also noticed edema.  She denies CP.  She is admitted for further evaluation.  She is improving with diuresis. Currently, she is resting comfortably.  Today, she denies symptoms of palpitations, chest pain,  dizziness, presyncope, syncope, or neurologic sequela. The patient is tolerating medications without difficulties and is otherwise without complaint today.   Past Medical History:  Diagnosis Date  . Acid reflux   . Arthritis   . Depression   . History of depression   . Hypercholesteremia   . Thyroid disease hypo  . Wears glasses    Past Surgical History:  Procedure Laterality Date  . CHOLECYSTECTOMY    . COLONOSCOPY    . FOOT SURGERY Right   . HAND SURGERY Bilateral   . SHOULDER SURGERY Left   . SPINAL CORD STIMULATOR INSERTION N/A 06/04/2015   Procedure: LUMBAR SPINAL CORD STIMULATOR INSERTION;  Surgeon: Clydell Hakim, MD;  Location: [place] [facility];  Service: Neurosurgery;  Laterality: N/A;    . aspirin EC  81 mg Oral Daily  . furosemide  40 mg Intravenous BID  . gabapentin  300 mg Oral Daily  . guaiFENesin  600 mg Oral BID  . heparin  1,000 Units Intravenous Once  . levothyroxine  150 mcg Oral Q0600  . loratadine  10 mg Oral Daily  . omega-3 acid ethyl esters  1 g Oral Daily  . pantoprazole  40 mg Oral Daily   . heparin 750 Units/hr (01/10/19 1900)    Allergies  Allergen Reactions  . Celebrex [Celecoxib] Swelling  . Mobic [Meloxicam] Swelling  . Adhesive [Tape] Itching and Rash    Social History   Socioeconomic History  . Marital status: Widowed    Spouse name: Not on file  . Number of children: Not on file   . Years of education: Not on file  . Highest education level: Not on file  Occupational History  . Not on file  Social Needs  . Financial resource strain: Not on file  . Food insecurity    Worry: Not on file    Inability: Not on file  . Transportation needs    Medical: Not on file    Non-medical: Not on file  Tobacco Use  . Smoking status: Former Smoker    Packs/day: 1.00    Years: 40.00    Pack years: 40.00    Types: Cigarettes  . Smokeless tobacco: Never Used  . Tobacco comment: quit smoking approximately 8-10 years ago (05/28/2015)  Substance and Sexual Activity  . Alcohol use: No  . Drug use: No  . Sexual activity: Not on file  Lifestyle  . Physical activity    Days per week: Not on file    Minutes per session: Not on file  . Stress: Not on file  Relationships  . Social Herbalist on phone: Not on file    Gets together: Not on file    Attends religious service: Not on file    Active member of club or organization: Not on file    Attends meetings of clubs or organizations: Not on file  Relationship status: Not on file  . Intimate partner violence    Fear of current or ex partner: Not on file    Emotionally abused: Not on file    Physically abused: Not on file    Forced sexual activity: Not on file  Other Topics Concern  . Not on file  Social History Narrative  . Not on file    Family History  Problem Relation Age of Onset  . Lung cancer Mother   . Heart failure Father   . Breast cancer Neg Hx     ROS- All systems are reviewed and negative except as per the HPI above  Physical Exam: Telemetry: sinus Vitals:   01/10/19 2121 01/11/19 0354 01/11/19 0500 01/11/19 0921  BP: (!) 161/80 (!) 143/66  (!) 151/73  Pulse: 96 92  99  Resp: 16 16    Temp: 98.4 F (36.9 C) 98.6 F (37 C)    TempSrc: Oral Oral    SpO2: 100% 99%  94%  Weight: 78.9 kg  78.9 kg   Height: _0  (1.549 m)       GEN- The patient is well appearing, alert and oriented x  3 today.   Head- normocephalic, atraumatic Eyes-  Sclera clear, conjunctiva pink Ears- hearing intact Oropharynx- clear Neck- supple, +JVD Lungs- few basilar rales, normal work of breathing Heart- Regular rate and rhythm  GI- soft, NT, ND, + BS Extremities- no clubbing, cyanosis,trace edema MS- age appropriate atrophy Skin- no rash or lesion Psych- euthymic mood, full affect Neuro- strength and sensation are intact  EKG:  Sinus rhythm, no dynamic ekg changes  Labs:   Lab Results  Component Value Date   WBC 8.1 01/11/2019   HGB 9.7 (L) 01/11/2019   HCT 34.0 (L) 01/11/2019   MCV 80.8 01/11/2019   PLT 262 01/11/2019    Recent Labs  Lab 01/11/19 0322 01/11/19 1051  NA 140  --   K 3.3* 4.2  CL 94*  --   CO2 36*  --   BUN 14  --   CREATININE 0.43*  --   CALCIUM 8.2*  --   PROT 6.4*  --   BILITOT 0.8  --   ALKPHOS 71  --   ALT 246*  --   AST 260*  --   GLUCOSE 78  --    Radiology CXR and CT reviewed  Echo: EF 60%, no significant WMA,   ASSESSMENT AND PLAN:    1. Acute diastolic dysfunction She presents with acute diastolic dysfunction This is likely hypertensive in origin.  My suspicion for ischemic is currently low. Stop IV heparin Continue IV diuresis over the weekend 2 gram sodium diet at discharge lexiscan myoview on Monday  2. Hypertensive cardiovascular disease with CHF Continue IV diuresis Avoid sodium Add losartan 30m daily today which can be titrated as BP requires  3. HL Statin on hold with abnormal LFTs  4. Anemia Stop heparin  cardiology to follow over the weekend  JThompson Grayer MD 01/11/2019  12:53 PM

## 2019-01-11 NOTE — Progress Notes (Signed)
[place] for heparin Indication: chest pain/ACS  Allergies  Allergen Reactions  . Celebrex [Celecoxib] Swelling  . Mobic [Meloxicam] Swelling  . Adhesive [Tape] Itching and Rash    Patient Measurements: Height: _0  (154.9 cm) Weight: 174 lb (78.9 kg) IBW/kg (Calculated) : 47.8 Heparin Dosing Weight: 66 kg  Vital Signs: Temp: 98.6 F (37 C) (09/12 0354) Temp Source: Oral (09/12 0354) BP: 143/66 (09/12 0354) Pulse Rate: 92 (09/12 0354)  Labs: Recent Labs    01/10/19 1636 01/10/19 1854 01/11/19 0322  HGB 9.5*  --  9.7*  HCT 33.5*  --  34.0*  PLT 268  --  262  APTT 32  --   --   LABPROT 16.1*  --   --   INR 1.3*  --   --   HEPARINUNFRC  --   --  0.33  CREATININE 0.46  --  0.43*  TROPONINIHS 328* 424*  --     Estimated Creatinine Clearance: 60.4 mL/min (A) (by C-G formula based on SCr of 0.43 mg/dL (L)).   Medical History: Past Medical History:  Diagnosis Date  . Acid reflux   . Arthritis   . Depression   . History of depression   . Hypercholesteremia   . Thyroid disease hypo  . Wears glasses      Assessment: 73 year old female admitted with SOB. Troponin elevated. Pharmacy consulted for heparin for ACS.  Goal of Therapy:  Heparin level 0.3-0.7 units/ml Monitor platelets by anticoagulation protocol: Yes   Plan:  09/12 @ 0300 HL 0.33 therapeutic. Will continue current rate and will recheck HL @ 1100, H/h low but stable will continue to monitor.  Tobie Lords, PharmD 01/11/2019,4:01 AM

## 2019-01-11 NOTE — Progress Notes (Signed)
*  PRELIMINARY RESULTS* Echocardiogram 2D Echocardiogram has been performed.  Carolyn Steele 01/11/2019, 11:27 AM

## 2019-01-11 NOTE — Progress Notes (Signed)
[place] [facility] [place] NAME: Carolyn Steele    MR#:  [id number]  DATE OF BIRTH:  1945-05-16  SUBJECTIVE:  came in with increasing shortness of breath. Daughter in the room. Feels better. As any chest pain.  REVIEW OF SYSTEMS:   Review of Systems  Constitutional: Negative for chills, fever and weight loss.  HENT: Negative for ear discharge, ear pain and nosebleeds.   Eyes: Negative for blurred vision, pain and discharge.  Respiratory: Positive for shortness of breath. Negative for sputum production, wheezing and stridor.   Cardiovascular: Negative for chest pain, palpitations, orthopnea and PND.  Gastrointestinal: Negative for abdominal pain, diarrhea, nausea and vomiting.  Genitourinary: Negative for frequency and urgency.  Musculoskeletal: Negative for back pain and joint pain.  Neurological: Negative for sensory change, speech change, focal weakness and weakness.  Psychiatric/Behavioral: Negative for depression and hallucinations. The patient is not nervous/anxious.    Tolerating Diet:yes Tolerating PT:   DRUG ALLERGIES:   Allergies  Allergen Reactions  . Celebrex [Celecoxib] Swelling  . Mobic [Meloxicam] Swelling  . Adhesive [Tape] Itching and Rash    VITALS:  Blood pressure (!) 151/73, pulse 99, temperature 98.6 F (37 C), temperature source Oral, resp. rate 16, height _0  (1.549 m), weight 78.9 kg, SpO2 94 %.  PHYSICAL EXAMINATION:   Physical Exam  GENERAL:  73 y.o.-year-old patient lying in the bed with no acute distress.  EYES: Pupils equal, round, reactive to light and accommodation. No scleral icterus. Extraocular muscles intact.  HEENT: Head atraumatic, normocephalic. Oropharynx and nasopharynx clear.  NECK:  Supple, no jugular venous distention. No thyroid enlargement, no tenderness.  LUNGS: Normal breath sounds bilaterally, no wheezing, rales, rhonchi. No use of accessory muscles of respiration.   CARDIOVASCULAR: S1, S2 normal. No murmurs, rubs, or gallops.  ABDOMEN: Soft, nontender, nondistended. Bowel sounds present. No organomegaly or mass.  EXTREMITIES: No cyanosis, clubbing or edema b/l.    NEUROLOGIC: Cranial nerves II through XII are intact. No focal Motor or sensory deficits b/l.   PSYCHIATRIC:  patient is alert and oriented x 3.  SKIN: No obvious rash, lesion, or ulcer.   LABORATORY PANEL:  CBC Recent Labs  Lab 01/11/19 0322  WBC 8.1  HGB 9.7*  HCT 34.0*  PLT 262    Chemistries  Recent Labs  Lab 01/11/19 0322 01/11/19 1051  NA 140  --   K 3.3* 4.2  CL 94*  --   CO2 36*  --   GLUCOSE 78  --   BUN 14  --   CREATININE 0.43*  --   CALCIUM 8.2*  --   AST 260*  --   ALT 246*  --   ALKPHOS 71  --   BILITOT 0.8  --    Cardiac Enzymes No results for input(s): TROPONINI in the last 168 hours. RADIOLOGY:  Ct Angio Chest Pe W And/or Wo Contrast  Result Date: 01/10/2019 CLINICAL DATA:  73 year old female with concern for pulmonary embolism. EXAM: CT ANGIOGRAPHY CHEST WITH CONTRAST TECHNIQUE: Multidetector CT imaging of the chest was performed using the standard protocol during bolus administration of intravenous contrast. Multiplanar CT image reconstructions and MIPs were obtained to evaluate the vascular anatomy. CONTRAST:  12m OMNIPAQUE IOHEXOL 350 MG/ML SOLN COMPARISON:  Chest CT dated 01/16/2014 FINDINGS: Cardiovascular: Mild cardiomegaly. No pericardial effusion. There is mild atherosclerotic calcification of the thoracic aorta. No aneurysmal dilatation or dissection. The origins of the great vessels of the aortic arch appear  patent as visualized. There is no CT evidence of pulmonary embolism. Mediastinum/Nodes: No hilar or mediastinal adenopathy. There is a small hiatal hernia. The esophagus is grossly unremarkable. No mediastinal fluid collection. Lungs/Pleura: Small right and probable trace left pleural effusion. There is moderate centrilobular emphysema.  Subsegmental right lung base compressive atelectasis. Pneumonia is less likely but not excluded. Clinical correlation is recommended. There is no pneumothorax. The central airways are patent. Upper Abdomen: Cholecystectomy. Dilated visualized common bile duct. The visualized upper abdomen is otherwise unremarkable. Musculoskeletal: No acute osseous pathology. Spinal stimulator noted over the midthoracic spine. Review of the MIP images confirms the above findings. IMPRESSION: 1. No CT evidence of pulmonary embolism. 2. Small bilateral pleural effusions with subsegmental right lung base compressive atelectasis. Pneumonia is less likely but not excluded. Clinical correlation is recommended. 3. Mild cardiomegaly. Aortic Atherosclerosis (ICD10-I70.0) and Emphysema (ICD10-J43.9). Electronically Signed   By: Anner Crete M.D.   On: 01/10/2019 19:43   Dg Chest Portable 1 View  Result Date: 01/10/2019 CLINICAL DATA:  73 year old female with shortness of breath. Evaluate for pneumonia. EXAM: PORTABLE CHEST 1 VIEW COMPARISON:  Chest radiograph dated 09/20/2010 and CT dated 01/16/2014 FINDINGS: Diffuse vascular prominence may represent congestion. No focal consolidation, pleural effusion, pneumothorax. The cardiac silhouette is within normal limits. Atherosclerotic calcification of the aorta. No acute osseous pathology. Spinal stimulator over the midthoracic spine noted. IMPRESSION: Probable vascular congestion.  No focal consolidation. Electronically Signed   By: Anner Crete M.D.   On: 01/10/2019 17:43   US Abdomen Limited Ruq  Result Date: 01/11/2019 CLINICAL DATA:  Abnormal LFTs EXAM: ULTRASOUND ABDOMEN LIMITED RIGHT UPPER QUADRANT COMPARISON:  None. FINDINGS: Gallbladder: Gallbladder is surgically absent. Common bile duct: Diameter: 18 mm Liver: No focal lesion identified. Within normal limits in parenchymal echogenicity. Portal vein is patent on color Doppler imaging with normal direction of blood flow  towards the liver. Other: Right pleural effusion, small IMPRESSION: Intrahepatic and extrahepatic biliary dilatation without visible obstruction. This may be secondary to post cholecystectomy physiology, but abdominal MRI or CT should be considered given the elevated liver function studies. Electronically Signed   By: Ulyses Jarred M.D.   On: 01/11/2019 00:17   ASSESSMENT AND PLAN:   73 y.o. female with a known history of GERD, osteoarthritis, depression, hyperlipidemia, hypothyroidism who presents to the hospital complaining of shortness of breath.  1.  Acute respiratory failure with hypoxia- suspected to be secondary to acute diastolic CHF. -Continue O2 supplementation, will treat patient's new onset CHF with IV diuresis with Lasix and follow I's and O's and daily weights, wean off oxygen as tolerated.  2.  CHF- this is new onset for the patient.  She presents with worsening shortness of breath, lower extremity edema with chest x-ray and CT chest findings suggestive of it. - We will check echocardiogram to rule out whether it systolic or diastolic. - Diurese the patient with IV Lasix, follow I's and O's and daily weights. -  patient was seen by Dr. Rayann Heman cardiology. Recommends myoview stress test on Monday -patient has had 4200 mL urine output   3.  Elevated troponin-suspected to be supply demand ischemia due to underlying CHF. - Follow troponin, empirically started on heparin in the ER, await further cardiology input.  4.  Abnormal LFTs-etiology unclear.  Patient has no abdominal pain. -Questionable chronic passive congestion from the CHF.   -ultrasound right upper quadrant -- no acute abnormality. Gallbladder absent. -LFTs trending down  5.  Hyperlipidemia- hold Pravachol given abnormal  LFTs.  6.  Hypothyroidism-continue Synthroid.  7.  Anemia- this is a normocytic anemia.  I will check an anemia panel and iron studies  Case discussed with Care Management/Social  Worker. Management plans discussed with the patient, family and they are in agreement.  CODE STATUS: full  DVT Prophylaxis: Lovenox  TOTAL TIME TAKING CARE OF THIS PATIENT: *30* minutes.  >50% time spent on counselling and coordination of care  POSSIBLE D/C IN *1-2* DAYS, DEPENDING ON CLINICAL CONDITION.  Note: This dictation was prepared with Dragon dictation along with smaller phrase technology. Any transcriptional errors that result from this process are unintentional.  Fritzi Mandes M.D on 01/11/2019 at 3:21 PM  Between 7am to 6pm - Pager - [phone]  After 6pm go to [email or web address] - password [email or web address] [facility]  Office  [phone]  CC: Primary care physician; Albina Billet, MDPatient ID: Roylene Reason, female   DOB: [birth year], 73 y.o.   MRN: [id number]

## 2019-01-11 NOTE — Progress Notes (Signed)
Pt was admitted on the floor with no signs of distress. VSS. Pt ascom and telephone within reach. Pt Heart Failure booklet management was given to pt. Will continue to monitor.

## 2019-01-11 NOTE — Plan of Care (Signed)
  Problem: Education: Goal: Knowledge of General Education information will improve Description: Including pain rating scale, medication(s)/side effects and non-pharmacologic comfort measures Outcome: Progressing   Problem: Pain Managment: Goal: General experience of comfort will improve Outcome: Progressing   Problem: Safety: Goal: Ability to remain free from injury will improve Outcome: Progressing

## 2019-01-12 LAB — BASIC METABOLIC PANEL
Anion gap: 13 (ref 5–15)
BUN: 15 mg/dL (ref 8–23)
CO2: 38 mmol/L — ABNORMAL HIGH (ref 22–32)
Calcium: 9.2 mg/dL (ref 8.9–10.3)
Chloride: 85 mmol/L — ABNORMAL LOW (ref 98–111)
Creatinine, Ser: 0.49 mg/dL (ref 0.44–1.00)
GFR calc Af Amer: >60 mL/min (ref >60)
GFR calc non Af Amer: >60 mL/min (ref >60)
Glucose, Bld: 105 mg/dL — ABNORMAL HIGH (ref 70–99)
Potassium: 4 mmol/L (ref 3.5–5.1)
Sodium: 136 mmol/L (ref 135–145)

## 2019-01-12 LAB — CBC
HCT: 38 % (ref 36.0–46.0)
Hemoglobin: 11 g/dL — ABNORMAL LOW (ref 12.0–15.0)
MCH: 23.3 pg — ABNORMAL LOW (ref 26.0–34.0)
MCHC: 28.9 g/dL — ABNORMAL LOW (ref 30.0–36.0)
MCV: 80.5 fL (ref 80.0–100.0)
Platelets: 302 10*3/uL (ref 150–400)
RBC: 4.72 MIL/uL (ref 3.87–5.11)
RDW: 16.4 % — ABNORMAL HIGH (ref 11.5–15.5)
WBC: 9.1 10*3/uL (ref 4.0–10.5)
nRBC: 0 % (ref 0.0–0.2)

## 2019-01-12 MED ORDER — FUROSEMIDE 20 MG PO TABS
20.0000 mg | ORAL_TABLET | Freq: Every day | ORAL | Status: DC
  Administered 2019-01-13: 20 mg via ORAL
  Filled 2019-01-12: qty 1

## 2019-01-12 NOTE — Progress Notes (Signed)
Progress Note   Subjective   Doing well today, the patient denies CP or SOB.   She has had robust diuresis since admit.  No new concerns  Inpatient Medications    Scheduled Meds: . aspirin EC  81 mg Oral Daily  . [START ON 01/13/2019] furosemide  20 mg Oral Daily  . gabapentin  300 mg Oral Daily  . guaiFENesin  600 mg Oral BID  . levothyroxine  150 mcg Oral Q0600  . loratadine  10 mg Oral Daily  . losartan  25 mg Oral Daily  . omega-3 acid ethyl esters  1 g Oral Daily  . pantoprazole  40 mg Oral Daily   Continuous Infusions:  PRN Meds: acetaminophen **OR** acetaminophen, lidocaine, ondansetron **OR** ondansetron (ZOFRAN) IV, traMADol   Vital Signs    Vitals:   01/12/19 0442 01/12/19 0504 01/12/19 0520 01/12/19 0741  BP:    139/67  Pulse: 97   95  Resp:    18  Temp:    98 F (36.7 C)  TempSrc:    Oral  SpO2: 93% 93% 91% 90%  Weight: 75 kg     Height:        Intake/Output Summary (Last 24 hours) at 01/12/2019 1220 Last data filed at 01/12/2019 1003 Gross per 24 hour  Intake 763.87 ml  Output 2650 ml  Net -1886.13 ml   Filed Weights   01/10/19 2121 01/11/19 0500 01/12/19 0442  Weight: 78.9 kg 78.9 kg 75 kg    Telemetry    sinus - Personally Reviewed  Physical Exam   GEN- The patient is well appearing, alert and oriented x 3 today.   Head- normocephalic, atraumatic Eyes-  Sclera clear, conjunctiva pink Ears- hearing intact Oropharynx- clear Neck- supple, Lungs- reduced rales, normal work of breathing Heart- Regular rate and rhythm  GI- soft, NT, ND, + BS Extremities- no clubbing, cyanosis, or edema  MS- no significant deformity or atrophy Skin- no rash or lesion Psych- euthymic mood, full affect Neuro- strength and sensation are intact   Labs    Chemistry Recent Labs  Lab 01/10/19 1636 01/11/19 0322 01/11/19 1051  NA 138 140  --   K 4.0 3.3* 4.2  CL 97* 94*  --   CO2 33* 36*  --   GLUCOSE 84 78  --   BUN 24* 14  --   CREATININE 0.46  0.43*  --   CALCIUM 8.4* 8.2*  --   PROT 6.3* 6.4*  --   ALBUMIN 3.2* 3.2*  --   AST 380* 260*  --   ALT 261* 246*  --   ALKPHOS 73 71  --   BILITOT 0.6 0.8  --   GFRNONAA >60 >60  --   GFRAA >60 >60  --   ANIONGAP 8 10  --      Hematology Recent Labs  Lab 01/10/19 1636 01/10/19 2132 01/11/19 0322 01/12/19 0534  WBC 7.3  --  8.1 9.1  RBC 4.11 4.50 4.21 4.72  HGB 9.5*  --  9.7* 11.0*  HCT 33.5*  --  34.0* 38.0  MCV 81.5  --  80.8 80.5  MCH 23.1*  --  23.0* 23.3*  MCHC 28.4*  --  28.5* 28.9*  RDW 16.2*  --  16.4* 16.4*  PLT 268  --  262 302       Assessment & Plan    1.  Acute diastolic dysfunction Clinically much improved with diuresis Check bmet today Likely hypertensive in  origin BP is improving with diuresis and losartan.  Continue to titrate losartan as needed lexiscan myoview is ordered for tomorrow to evaluate for ischemia as the cause  2. Hypertensive cardiovascular disease with CHF Improving Convert to oral lasix today Losartan titration as needed  3. HL Primary team evaluating elevated LFTs Statin on hold  4. Anemia  Improved  Hopefully home tomorrow depending on results of Angelica Pou MD, [facility] 01/12/2019 12:20 PM

## 2019-01-12 NOTE — Plan of Care (Signed)
  Problem: Education: Goal: Knowledge of General Education information will improve Description: Including pain rating scale, medication(s)/side effects and non-pharmacologic comfort measures Outcome: Progressing   Problem: Health Behavior/Discharge Planning: Goal: Ability to manage health-related needs will improve Outcome: Progressing Note: Patient for a stress test in the AM. To be consented later in shift. Will continue to monitor overall progression. Wenda Low [facility]

## 2019-01-12 NOTE — Progress Notes (Signed)
[place] at [place] NAME: Carolyn Steele    MR#:  [id number]  DATE OF BIRTH:  1945-09-20  SUBJECTIVE:  came in with increasing shortness of breath. Daughter in the room. Feels better. As any chest pain.  REVIEW OF SYSTEMS:   Review of Systems  Constitutional: Negative for chills, fever and weight loss.  HENT: Negative for ear discharge, ear pain and nosebleeds.   Eyes: Negative for blurred vision, pain and discharge.  Respiratory: Positive for shortness of breath. Negative for sputum production, wheezing and stridor.   Cardiovascular: Negative for chest pain, palpitations, orthopnea and PND.  Gastrointestinal: Negative for abdominal pain, diarrhea, nausea and vomiting.  Genitourinary: Negative for frequency and urgency.  Musculoskeletal: Negative for back pain and joint pain.  Neurological: Negative for sensory change, speech change, focal weakness and weakness.  Psychiatric/Behavioral: Negative for depression and hallucinations. The patient is not nervous/anxious.    Tolerating Diet:yes Tolerating PT: pending  DRUG ALLERGIES:   Allergies  Allergen Reactions  . Celebrex [Celecoxib] Swelling  . Mobic [Meloxicam] Swelling  . Adhesive [Tape] Itching and Rash    VITALS:  Blood pressure 139/67, pulse 95, temperature 98 F (36.7 C), temperature source Oral, resp. rate 18, height _0  (1.549 m), weight 75 kg, SpO2 90 %.  PHYSICAL EXAMINATION:   Physical Exam  GENERAL:  73 y.o.-year-old patient lying in the bed with no acute distress.  EYES: Pupils equal, round, reactive to light and accommodation. No scleral icterus. Extraocular muscles intact.  HEENT: Head atraumatic, normocephalic. Oropharynx and nasopharynx clear.  NECK:  Supple, no jugular venous distention. No thyroid enlargement, no tenderness.  LUNGS: decreased breath sounds bilaterally, no wheezing, rales, rhonchi. No use of accessory muscles of respiration.   CARDIOVASCULAR: S1, S2 normal. No murmurs, rubs, or gallops.  ABDOMEN: Soft, nontender, nondistended. Bowel sounds present. No organomegaly or mass.  EXTREMITIES: No cyanosis, clubbing or edema b/l.    NEUROLOGIC: Cranial nerves II through XII are intact. No focal Motor or sensory deficits b/l.   PSYCHIATRIC:  patient is alert and oriented x 3.  SKIN: No obvious rash, lesion, or ulcer.   LABORATORY PANEL:  CBC Recent Labs  Lab 01/12/19 0534  WBC 9.1  HGB 11.0*  HCT 38.0  PLT 302    Chemistries  Recent Labs  Lab 01/11/19 0322 01/11/19 1051  NA 140  --   K 3.3* 4.2  CL 94*  --   CO2 36*  --   GLUCOSE 78  --   BUN 14  --   CREATININE 0.43*  --   CALCIUM 8.2*  --   AST 260*  --   ALT 246*  --   ALKPHOS 71  --   BILITOT 0.8  --    Cardiac Enzymes No results for input(s): TROPONINI in the last 168 hours. RADIOLOGY:  Ct Angio Chest Pe W And/or Wo Contrast  Result Date: 01/10/2019 CLINICAL DATA:  73 year old female with concern for pulmonary embolism. EXAM: CT ANGIOGRAPHY CHEST WITH CONTRAST TECHNIQUE: Multidetector CT imaging of the chest was performed using the standard protocol during bolus administration of intravenous contrast. Multiplanar CT image reconstructions and MIPs were obtained to evaluate the vascular anatomy. CONTRAST:  57m OMNIPAQUE IOHEXOL 350 MG/ML SOLN COMPARISON:  Chest CT dated 01/16/2014 FINDINGS: Cardiovascular: Mild cardiomegaly. No pericardial effusion. There is mild atherosclerotic calcification of the thoracic aorta. No aneurysmal dilatation or dissection. The origins of the great vessels of the aortic arch appear patent  as visualized. There is no CT evidence of pulmonary embolism. Mediastinum/Nodes: No hilar or mediastinal adenopathy. There is a small hiatal hernia. The esophagus is grossly unremarkable. No mediastinal fluid collection. Lungs/Pleura: Small right and probable trace left pleural effusion. There is moderate centrilobular emphysema.  Subsegmental right lung base compressive atelectasis. Pneumonia is less likely but not excluded. Clinical correlation is recommended. There is no pneumothorax. The central airways are patent. Upper Abdomen: Cholecystectomy. Dilated visualized common bile duct. The visualized upper abdomen is otherwise unremarkable. Musculoskeletal: No acute osseous pathology. Spinal stimulator noted over the midthoracic spine. Review of the MIP images confirms the above findings. IMPRESSION: 1. No CT evidence of pulmonary embolism. 2. Small bilateral pleural effusions with subsegmental right lung base compressive atelectasis. Pneumonia is less likely but not excluded. Clinical correlation is recommended. 3. Mild cardiomegaly. Aortic Atherosclerosis (ICD10-I70.0) and Emphysema (ICD10-J43.9). Electronically Signed   By: Anner Crete M.D.   On: 01/10/2019 19:43   Dg Chest Portable 1 View  Result Date: 01/10/2019 CLINICAL DATA:  73 year old female with shortness of breath. Evaluate for pneumonia. EXAM: PORTABLE CHEST 1 VIEW COMPARISON:  Chest radiograph dated 09/20/2010 and CT dated 01/16/2014 FINDINGS: Diffuse vascular prominence may represent congestion. No focal consolidation, pleural effusion, pneumothorax. The cardiac silhouette is within normal limits. Atherosclerotic calcification of the aorta. No acute osseous pathology. Spinal stimulator over the midthoracic spine noted. IMPRESSION: Probable vascular congestion.  No focal consolidation. Electronically Signed   By: Anner Crete M.D.   On: 01/10/2019 17:43   US Abdomen Limited Ruq  Result Date: 01/11/2019 CLINICAL DATA:  Abnormal LFTs EXAM: ULTRASOUND ABDOMEN LIMITED RIGHT UPPER QUADRANT COMPARISON:  None. FINDINGS: Gallbladder: Gallbladder is surgically absent. Common bile duct: Diameter: 18 mm Liver: No focal lesion identified. Within normal limits in parenchymal echogenicity. Portal vein is patent on color Doppler imaging with normal direction of blood flow  towards the liver. Other: Right pleural effusion, small IMPRESSION: Intrahepatic and extrahepatic biliary dilatation without visible obstruction. This may be secondary to post cholecystectomy physiology, but abdominal MRI or CT should be considered given the elevated liver function studies. Electronically Signed   By: Ulyses Jarred M.D.   On: 01/11/2019 00:17   ASSESSMENT AND PLAN:   73 y.o. female with a known history of GERD, osteoarthritis, depression, hyperlipidemia, hypothyroidism who presents to the hospital complaining of shortness of breath.  1.  Acute respiratory failure with hypoxia- suspected to be secondary to acute diastolic CHF. -Continue O2 supplementation, will treat patient's new onset CHF with IV diuresis with Lasix and follow I's and O's and daily weights, wean off oxygen as tolerated.  2.  CHF- this is new onset for the patient.  She presents with worsening shortness of breath, lower extremity edema with chest x-ray and CT chest findings suggestive of it. - Diurese the patient with IV Lasix, follow I's and O's and daily weights-- to oral Lasix 20 mg from tomorrow -UOP 6.2 liters so far -Reds clip score--32% -  patient was seen by Dr. Rayann Heman cardiology. Recommends myoview stress test on Monday   3. Elevated troponin-suspected to be supply demand ischemia due to underlying CHF. - myoview scheduled for tomorrow.  4.  Abnormal LFTs-etiology unclear.  Patient has no abdominal pain. -Questionable chronic passive congestion from the CHF.   -ultrasound right upper quadrant -- no acute abnormality. Gallbladder absent. -LFTs trending down  5.  Hyperlipidemia- hold Pravachol given abnormal LFTs.  6.  Hypothyroidism-continue Synthroid.  7.  Anemia- this is a normocytic anemia.  I will check an anemia panel and iron studies  Case discussed with Care Management/Social Worker. Management plans discussed with the patient and they are in agreement.  CODE STATUS: full  DVT  Prophylaxis: Lovenox  TOTAL TIME TAKING CARE OF THIS PATIENT: *30* minutes.  >50% time spent on counselling and coordination of care  POSSIBLE D/C IN *1-2* DAYS, DEPENDING ON CLINICAL CONDITION.  Note: This dictation was prepared with Dragon dictation along with smaller phrase technology. Any transcriptional errors that result from this process are unintentional.  Fritzi Mandes M.D on 01/12/2019 at 12:13 PM  Between 7am to 6pm - Pager - [phone]  After 6pm go to [email or web address] - password [email or web address] [facility]  Office  [phone]  CC: Primary care physician; Albina Billet, MDPatient ID: Roylene Reason, female   DOB: Sep 22, 1945, 73 y.o.   MRN: [id number]

## 2019-01-12 NOTE — Progress Notes (Signed)
Pt oxygen was at 93 % on 1.5 liters oxygen. Will continue to monitor.

## 2019-01-12 NOTE — Evaluation (Signed)
Physical Therapy Evaluation Patient Details Name: Carolyn Steele MRN: [id number] DOB: Sep 05, 1945 Today's Date: 01/12/2019   History of Present Illness  Carolyn Steele is a 82yoF who comes to [facility] on 9/11 c SOB. PMH: GERD, osteoarthritis, depression, hyperlipidemia, hypothyroidism.  Pt reports sOB x 1 weeks, now with DOE. Pt 80s% on room air upon arrival. COVID test (-). PTA no home O2 use, RW for outside of home.  Clinical Impression  Pt admitted with above diagnosis. Pt currently with functional limitations due to the deficits listed below (see "PT Problem List"). Upon entry, pt in bed, awake and agreeable to participate. The pt is alert and oriented x4, pleasant, conversational, and generally a good historian. Pt on 1.5L upon entry at 85%, cues to perform pursed-lip breathing, with improvement to 89% over 60 seconds. 5xSTS from EOB on 1.5L/min with drop to 82% and slow recovery. Pt moved to 4L/min for recovery to 95% over 2 minutes, then AMB 55f on 4L/min with terminal SpO2: 85% and slow recovery. Pt returned to 3L/min at end of session. Pt unable to rise from EOB without min-modA despite multiple attempts, but supervision from elevated surface. Pt attests to acute weakness. Functional mobility assessment demonstrates increased effort/time requirements, poor tolerance, and need for physical assistance, whereas the patient performed these at a higher level of independence PTA. Pt will benefit from skilled PT intervention to increase independence and safety with basic mobility in preparation for discharge to the venue listed below.       Follow Up Recommendations Home health PT    Equipment Recommendations  None recommended by PT    Recommendations for Other Services       Precautions / Restrictions Precautions Precautions: Fall Restrictions Weight Bearing Restrictions: No      Mobility  Bed Mobility Overal bed mobility: Modified Independent                 Transfers Overall transfer level: Needs assistance Equipment used: Rolling walker (2 wheeled) Transfers: Sit to/from Stand Sit to Stand: Min assist         General transfer comment: able to rise supervision from elevated surface(5xSTS on 1.5L (as received) terminal SpO2: 82%)  Ambulation/Gait Ambulation/Gait assistance: Min guard Gait Distance (Feet): 48 Feet Assistive device: Rolling walker (2 wheeled) Gait Pattern/deviations: Step-through pattern     General Gait Details: on 4L/min, terminal SpO2: 85%  Stairs            Wheelchair Mobility    Modified Rankin (Stroke Patients Only)       Balance Overall balance assessment: History of Falls;Modified Independent                                           Pertinent Vitals/Pain Pain Assessment: No/denies pain    Home Living Family/patient expects to be discharged to:: Private residence Living Arrangements: Children(DTR '[place]mar[place] Help at Discharge: Family(brother lives down the street) Type of Home: House Home Access: Ramped entrance     Home Layout: One level Home Equipment: WEnvironmental consultant- 2 wheels;Walker - 4 wheels      Prior Function Level of Independence: Independent with assistive device(s)         Comments: independent in ADL; furniture walker inside, uses RW outside. 1 fall in 6 months     Hand Dominance   Dominant Hand: Right    Extremity/Trunk Assessment  Upper Extremity Assessment Upper Extremity Assessment: Generalized weakness;Overall [facility] for tasks assessed    Lower Extremity Assessment Lower Extremity Assessment: Generalized weakness;Overall [facility] for tasks assessed    Cervical / Trunk Assessment Cervical / Trunk Assessment: Normal  Communication      Cognition Arousal/Alertness: Awake/alert Behavior During Therapy: WFL for tasks assessed/performed Overall Cognitive Status: Within Functional Limits for tasks assessed                                         General Comments      Exercises     Assessment/Plan    PT Assessment Patient needs continued PT services  PT Problem List Decreased strength;Decreased activity tolerance;Decreased balance;Decreased mobility;Decreased knowledge of precautions       PT Treatment Interventions DME instruction;Balance training;Gait training;Functional mobility training;Therapeutic activities;Therapeutic exercise;Stair training;Patient/family education    PT Goals (Current goals can be found in the Care Plan section)  Acute Rehab PT Goals Patient Stated Goal: return to home and regain strength PT Goal Formulation: With patient Time For Goal Achievement: 01/26/19 Potential to Achieve Goals: Good    Frequency Min 2X/week   Barriers to discharge   would need O2 for short room-to-room distances at this time    Co-evaluation               AM-PAC PT "6 Clicks" Mobility  Outcome Measure Help needed turning from your back to your side while in a flat bed without using bedrails?: None Help needed moving from lying on your back to sitting on the side of a flat bed without using bedrails?: None Help needed moving to and from a bed to a chair (including a wheelchair)?: A Little Help needed standing up from a chair using your arms (e.g., wheelchair or bedside chair)?: A Little Help needed to walk in hospital room?: A Little Help needed climbing 3-5 steps with a railing? : A Lot 6 Click Score: 19    End of Session Equipment Utilized During Treatment: Gait belt;Oxygen Activity Tolerance: Patient tolerated treatment well;No increased pain;Patient limited by fatigue;Treatment limited secondary to medical complications (Comment) Patient left: in bed;with family/visitor present Nurse Communication: Mobility status PT Visit Diagnosis: Unsteadiness on feet (R26.81)    Time: 1980-2217 PT Time Calculation (min) (ACUTE ONLY): 20 min   Charges:   PT Evaluation $PT Eval Moderate  Complexity: 1 Mod PT Treatments $Therapeutic Exercise: 8-22 mins        12:34 PM, 01/12/19 Etta Grandchild, PT, DPT Physical Therapist - [facility]  [phone] ([place])    Buccola,Allan C 01/12/2019, 12:31 PM

## 2019-01-13 ENCOUNTER — Encounter: Payer: Self-pay | Admitting: Radiology

## 2019-01-13 ENCOUNTER — Inpatient Hospital Stay (HOSPITAL_COMMUNITY): Payer: Medicare Other

## 2019-01-13 DIAGNOSIS — I5033 Acute on chronic diastolic (congestive) heart failure: Secondary | ICD-10-CM

## 2019-01-13 LAB — CBC
HCT: 41.3 % (ref 36.0–46.0)
Hemoglobin: 11.8 g/dL — ABNORMAL LOW (ref 12.0–15.0)
MCH: 22.7 pg — ABNORMAL LOW (ref 26.0–34.0)
MCHC: 28.6 g/dL — ABNORMAL LOW (ref 30.0–36.0)
MCV: 79.4 fL — ABNORMAL LOW (ref 80.0–100.0)
Platelets: 351 10*3/uL (ref 150–400)
RBC: 5.2 MIL/uL — ABNORMAL HIGH (ref 3.87–5.11)
RDW: 16.3 % — ABNORMAL HIGH (ref 11.5–15.5)
WBC: 8.8 10*3/uL (ref 4.0–10.5)
nRBC: 0 % (ref 0.0–0.2)

## 2019-01-13 LAB — NM MYOCAR MULTI W/SPECT W/WALL MOTION / EF
Estimated workload: 1 METS
Exercise duration (min): 0 min
Exercise duration (sec): 0 s
LV dias vol: 62 mL (ref 46–106)
LV sys vol: 34 mL
MPHR: 148 {beats}/min
Peak HR: 116 {beats}/min
Percent HR: 78 %
Rest HR: 108 {beats}/min
SDS: 0
SRS: 0
SSS: 6
TID: 0.7

## 2019-01-13 LAB — LIPID PANEL
Cholesterol: 187 mg/dL (ref 0–200)
HDL: 69 mg/dL (ref >40)
LDL Cholesterol: 101 mg/dL — ABNORMAL HIGH (ref 0–99)
Total CHOL/HDL Ratio: 2.7 RATIO
Triglycerides: 84 mg/dL (ref <150)
VLDL: 17 mg/dL (ref 0–40)

## 2019-01-13 LAB — TROPONIN I (HIGH SENSITIVITY): Troponin I (High Sensitivity): 62 ng/L — ABNORMAL HIGH (ref <18)

## 2019-01-13 MED ORDER — FUROSEMIDE 20 MG PO TABS: 20.0000 mg | ORAL_TABLET | Freq: Every day | ORAL | 1 refills | Status: DC

## 2019-01-13 MED ORDER — LOSARTAN POTASSIUM 25 MG PO TABS: 25.0000 mg | ORAL_TABLET | Freq: Every day | ORAL | 0 refills | Status: DC

## 2019-01-13 MED ORDER — TECHNETIUM TC 99M TETROFOSMIN IV KIT
33.2400 | PACK | Freq: Once | INTRAVENOUS | Status: AC | PRN
  Administered 2019-01-13: 33.24 via INTRAVENOUS

## 2019-01-13 MED ORDER — TECHNETIUM TC 99M TETROFOSMIN IV KIT
10.0000 | PACK | Freq: Once | INTRAVENOUS | Status: AC | PRN
  Administered 2019-01-13: 9.55 via INTRAVENOUS

## 2019-01-13 MED ORDER — REGADENOSON 0.4 MG/5ML IV SOLN
0.4000 mg | Freq: Once | INTRAVENOUS | Status: AC
  Administered 2019-01-13: 0.4 mg via INTRAVENOUS

## 2019-01-13 NOTE — TOC Initial Note (Signed)
Transition of Care [facility]) - Initial/Assessment Note    Patient Details  Name: Carolyn Steele MRN: [id number] Date of Birth: 1946-03-20  Transition of Care [facility]) CM/SW Contact:    Elza Rafter, RN Phone Number: 01/13/2019, 11:27 AM  Clinical Narrative:   Patient is from home with daughter.  Admitted with CHF; SOB.  BNP 531.  Patient is currently getting a stress test.  Spoke with daughter in room.  Patient is current with PCP; obtains medications without difficulty.  Currently on 3L acute oxygen.  Spoke with RN about obtaining qualifying saturations for possible home oxygen.  Lasix changed to PO.  Offered home health services; referral made to [facility] with [place] for RN, PT.  Has a scale at home.  Educated importance of weighing daily.  Daughter verbalizes understanding.  Heart Failure Clinic appointment scheduled.  Will continue to assist with discharge needs and progression of patient.  Geoffry Paradise, BSN, RN Care Manager [phone]   Expected Discharge Plan: [place] Services Barriers to Discharge: Continued Medical Work up   Patient Goals and CMS Choice Patient states their goals for this hospitalization and ongoing recovery are:: Discharge home with home health services CMS Medicare.gov Compare Post Acute Care list provided to:: Patient Represenative (must comment)(daughter) Choice offered to / list presented to : Adult Children  Expected Discharge Plan and Services Expected Discharge Plan: [place]   Discharge Planning Services: CM Consult, HF Clinic Post Acute Care Choice: [place] arrangements for the past 2 months: [place]: RN, PT [facility] Agency: [place] ([place]) Date [place]: 01/13/19 Time [place]: 1126 Representative spoke with at [place]: [place] Arrangements/Services Living arrangements for the past 2  months: [place] Lives with:: Adult Children Patient language and need for interpreter reviewed:: Yes Do you feel safe going back to the place where you live?: Yes      Need for Family Participation in Patient Care: Yes (Comment) Care giver support system in place?: Yes (comment)   Criminal Activity/Legal Involvement Pertinent to Current Situation/Hospitalization: No - Comment as needed  Activities of Daily Living Home Assistive Devices/Equipment: Eyeglasses, Dentures (specify type), Walker (specify type) ADL Screening (condition at time of admission) Patient's cognitive ability adequate to safely complete daily activities?: Yes Is the patient deaf or have difficulty hearing?: No Does the patient have difficulty seeing, even when wearing glasses/contacts?: No Does the patient have difficulty concentrating, remembering, or making decisions?: No Patient able to express need for assistance with ADLs?: Yes Does the patient have difficulty dressing or bathing?: No Independently performs ADLs?: Yes (appropriate for developmental age) Does the patient have difficulty walking or climbing stairs?: Yes Weakness of Legs: Both Weakness of Arms/Hands: None  Permission Sought/Granted Permission sought to share information with : Chartered certified accountant granted to share information with : Yes, Verbal Permission Granted  Share Information with NAME: Corene Cornea           Emotional Assessment       Orientation: : Oriented to Self, Oriented to Place, Oriented to  Time, Oriented to Situation Alcohol / Substance Use: Not Applicable Psych Involvement: No (comment)  Admission diagnosis:  SOB (shortness of breath) [R06.02] Acute respiratory failure with hypoxia (HCC) [J96.01] Abnormal LFTs [R94.5] Patient Active  Problem List   Diagnosis Date Noted  . CHF (congestive heart failure) ([place]) 01/10/2019   PCP:  Albina Billet, MD Pharmacy:   [facility] [place], [place] [facility] [address] [place] [facility] [place] [address] Phone: [phone] Fax: [phone]     Social Determinants of Health (SDOH) Interventions    Readmission Risk Interventions No flowsheet data found.

## 2019-01-13 NOTE — Progress Notes (Addendum)
Progress Note  Patient Name: Carolyn Steele Date of Encounter: 01/13/2019  Primary Cardiologist: [facility], Dr. Fletcher Anon rounding  Subjective   Patient denies any chest pain, palpitations, or racing heart rate. No further SOB/DOE. No presyncope.  Reported continued productive cough with green phlegm that has been running down "into her chest."   No hemoptysis, hematochezia, melena, hematuria.   Inpatient Medications    Scheduled Meds: . aspirin EC  81 mg Oral Daily  . furosemide  20 mg Oral Daily  . gabapentin  300 mg Oral Daily  . guaiFENesin  600 mg Oral BID  . levothyroxine  150 mcg Oral Q0600  . loratadine  10 mg Oral Daily  . losartan  25 mg Oral Daily  . omega-3 acid ethyl esters  1 g Oral Daily  . pantoprazole  40 mg Oral Daily   Continuous Infusions:  PRN Meds: acetaminophen **OR** acetaminophen, lidocaine, ondansetron **OR** ondansetron (ZOFRAN) IV, traMADol   Vital Signs    Vitals:   01/12/19 1946 01/13/19 0423 01/13/19 0426 01/13/19 0824  BP: 127/80 (!) 157/63  128/80  Pulse: (!) 110 (!) 113  (!) 106  Resp: 20 18    Temp: 98.3 F (36.8 C) 97.9 F (36.6 C)  98.4 F (36.9 C)  TempSrc: Oral Oral  Oral  SpO2: 92% 91%  96%  Weight:   73.8 kg   Height:        Intake/Output Summary (Last 24 hours) at 01/13/2019 1053 Last data filed at 01/13/2019 0100 Gross per 24 hour  Intake 480 ml  Output 200 ml  Net 280 ml   Last 3 Weights 01/13/2019 01/12/2019 01/11/2019  Weight (lbs) 162 lb 11.2 oz 165 lb 6.4 oz 174 lb  Weight (kg) 73.8 kg 75.025 kg 78.926 kg      Telemetry    NSR at time of stress - Personally Reviewed  ECG    No new tracings- Personally Reviewed  Physical Exam   GEN: No acute distress.   Neck: No JVD  Cardiac: RRR, no murmurs, rubs, or gallops.  Respiratory: Bibasilar rhonchi, crackles GI: Soft, nontender, non-distended  MS: Trace bilateral lower extremity edema; No deformity. Neuro:  Non-focal, somewhat anxious 2/2 claustrophobia  from scan.   Psych: Normal affect   Labs    High Sensitivity Troponin:   Recent Labs  Lab 01/10/19 1636 01/10/19 1854  TROPONINIHS 328* 424*      Cardiac EnzymesNo results for input(s): TROPONINI in the last 168 hours. No results for input(s): TROPIPOC in the last 168 hours.   Chemistry Recent Labs  Lab 01/10/19 1636 01/11/19 0322 01/11/19 1051 01/12/19 1228  NA 138 140  --  136  K 4.0 3.3* 4.2 4.0  CL 97* 94*  --  85*  CO2 33* 36*  --  38*  GLUCOSE 84 78  --  105*  BUN 24* 14  --  15  CREATININE 0.46 0.43*  --  0.49  CALCIUM 8.4* 8.2*  --  9.2  PROT 6.3* 6.4*  --   --   ALBUMIN 3.2* 3.2*  --   --   AST 380* 260*  --   --   ALT 261* 246*  --   --   ALKPHOS 73 71  --   --   BILITOT 0.6 0.8  --   --   GFRNONAA >60 >60  --  >60  GFRAA >60 >60  --  >60  ANIONGAP 8 10  --  13  Hematology Recent Labs  Lab 01/10/19 1636 01/10/19 2132 01/11/19 0322 01/12/19 0534  WBC 7.3  --  8.1 9.1  RBC 4.11 4.50 4.21 4.72  HGB 9.5*  --  9.7* 11.0*  HCT 33.5*  --  34.0* 38.0  MCV 81.5  --  80.8 80.5  MCH 23.1*  --  23.0* 23.3*  MCHC 28.4*  --  28.5* 28.9*  RDW 16.2*  --  16.4* 16.4*  PLT 268  --  262 302    BNP Recent Labs  Lab 01/10/19 1636  BNP 531.0*     DDimer No results for input(s): DDIMER in the last 168 hours.   Radiology    No results found.  Cardiac Studies   Echo 01/11/2019  1. The left ventricle has normal systolic function with an ejection fraction of 60-65%. The cavity size was normal. Left ventricular diastolic Doppler parameters are consistent with pseudonormalization.  2. The right ventricle has normal systolic function. The cavity was normal. There is no increase in right ventricular wall thickness. Right ventricular systolic pressure is moderately elevated with an estimated pressure of 51.0 mmHg.  3. The inferior vena cava was dilated in size with >50% respiratory variability.   Patient Profile     73 y.o. female with a history of  HFpEF (EF 60-65%), HLD, HTN, GERD, hypothyroid, and previous history of tobacco use and who presented with SOB over the past few days.   Assessment & Plan    Acute on chronic HFpEF --No SOB. Underwent diuresis over the weekend. Transitioned to low dose oral diuresis with ARB.  --Negative net -5.5L for admission. -180cc in last 24h. Wt 80kg  73.8kg. CT 9/11 showed trace b/l pleural effusion and pna could not be excluded. No pulmonary embolism. --Echo above shows normal EF, elevated right heart pressures. 9/11 BNP not terribly elevated. Does not appear terribly volume overloaded on exam but still reporting productive cough though breathing improved. --Recommend follow-up in the office as patient confirmed no previous cardiologist. Will also need follow-up renal function and electrolytes with BMET given started on low dose diuretic and ARB this admission. Will order BMET for today.  Troponin elevation without CP --No current CP. HS Tn 328  424. Ordered additional HS Tn to ensure peaked and down-trending. Consider supply demand ischemia in the setting of anemia and volume overload. EKG without acute changes.  --Underwent stress test today with further recommendations pending stress test findings.  --Continue PTA ASA with PPI. Addition of low dose BB as tolerated given tachycardic rate with room in BP. Continue ARB.  HTN --Continue ARB, low dose lasix. Addition of BB as tolerated given tachycardic rates.  Anemia --Workup per IM. Hgb improved from 9/12 to 9/13. Ordered repeat CBC. Recommend continue PPI with PTA ASA.   HLD --Reported history of HLD. Ordered lipid panel as no LDL on file to confirm. Pending results; however, given elevated ALT/AST, would defer addition of statin at this time pending repeat LFTs.  Elevated LFTs --Per IM   For questions or updates, please contact [place] Please consult [email or web address] for contact info under        Signed, Arvil Chaco, PA-C   01/13/2019, 10:53 AM

## 2019-01-13 NOTE — Care Management Important Message (Signed)
Important Message  Patient Details  Name: Carolyn Steele MRN: [id number] Date of Birth: 1945/08/02   Medicare Important Message Given:  Yes     Dannette Barbara 01/13/2019, 11:35 AM

## 2019-01-13 NOTE — Progress Notes (Signed)
Patient ID: Carolyn Steele, female   DOB: 18-Aug-1945, 73 y.o.   MRN: [id number] patient called in the room and informed of normal stress test. Will discharge her to home. She is agreeable.

## 2019-01-13 NOTE — TOC Transition Note (Addendum)
Transition of Care [facility]) - CM/SW Discharge Note   Patient Details  Name: Carolyn Steele MRN: [id number] Date of Birth: January 14, 1946  Transition of Care [facility]) CM/SW Contact:  Elza Rafter, RN Phone Number: 01/13/2019, 1:46 PM   Clinical Narrative:   Patient is discharging to home today with [place] RN and PT; Corene Cornea is aware of discharge.  [facility] will bring oxygen to room prior to discharge.  No further needs identified at this time.    _0 -[facility] cannot provide oxygen as patient does not have a chronic diagnoses; emphysema will not qualify her for oxygen either.  Called Jeneen Rinks with [facility] and they will deliver oxygen to bedside in 1-2 hours.       Final next level of care: [place] Barriers to Discharge: Continued Medical Work up   Patient Goals and CMS Choice Patient states their goals for this hospitalization and ongoing recovery are:: Discharge home with home health services CMS Medicare.gov Compare Post Acute Care list provided to:: Patient Represenative (must comment)(daughter) Choice offered to / list presented to : Adult Children  Discharge Placement                       Discharge Plan and Services   Discharge Planning Services: CM Consult, HF Clinic Post Acute Care Choice: Home Health                    HH Arranged: RN, PT [facility] Agency: [place] ([place]) Date [place]: 01/13/19 Time [place]: 1126 Representative spoke with at [place]: [employer] ([place]) Interventions     Readmission Risk Interventions No flowsheet data found.

## 2019-01-13 NOTE — Progress Notes (Signed)
Meds to [facility] - discharge counseling  New medications: losartan, furosemide  Explained the Meds to [facility] program to patient and obtained consent before letting Dr. Posey Pronto know to send new scripts for losartan and furosemide to the [place]. The patient's daughter paid for the medications over the phone and I was able to pick them up from the pharmacy to deliver to the patient's room. I counseled the patient and her daughter on the importance of having a blood pressure cuff, purpose of the medications and how to take them, and symptoms to let her doctor know about if she experiences them.    Amontae Ng Dear Nicholes Mango, pharmacy student 01/13/2019, 2:41pm

## 2019-01-13 NOTE — Progress Notes (Signed)
Patient back from stress test. Ambulated to the bathroom with assistance. Oxygen qualification in the computer. Patient is not complaining of any pain, mild shortness of breath with ambulation, but down to 2L. VSS.

## 2019-01-13 NOTE — Discharge Instructions (Signed)
Use your oxygen as instructed.

## 2019-01-13 NOTE — Discharge Summary (Addendum)
[facility] [place] NAME: Carolyn Steele    MR#:  [id number]  DATE OF BIRTH:  [birth year]  DATE OF ADMISSION:  01/10/2019 ADMITTING PHYSICIAN: Henreitta Leber, MD  DATE OF DISCHARGE: 01/13/2019  PRIMARY CARE PHYSICIAN: Albina Billet, MD    ADMISSION DIAGNOSIS:  SOB (shortness of breath) [R06.02] Acute respiratory failure with hypoxia (HCC) [J96.01] Abnormal LFTs [R94.5]  DISCHARGE DIAGNOSIS:  Congestive heart failure acute diastolic  SECONDARY DIAGNOSIS:   Past Medical History:  Diagnosis Date  . Acid reflux   . Arthritis   . Depression   . History of depression   . Hypercholesteremia   . Thyroid disease hypo  . Wears glasses     HOSPITAL COURSE:   [name], osteoarthritis, depression, hyperlipidemia, hypothyroidism who presents to the hospital complaining of shortness of breath.  1. Acute respiratory failure with hypoxia-secondary to acute diastolic CHF with h/o emphysema (seen on CT in the past) -Continue O2 supplementation, will treat patient's new onset CHF with IV diuresis with Lasix and follow I's and O's and daily weights, wean off oxygen as tolerated.-Patient qualifies for home oxygen  2. Acute diastolicCHF-this is new onset for the patient. She presents with worsening shortness of breath, lower extremity edema with chest x-ray and CT chest findings suggestive of it. -Diurese the patient with IV Lasix, follow I's and O's and daily weights-- to oral Lasix 20 mg from today -UOP 6.2 liters so far -Reds clip score--32% on 01/12/2019 - patient was seen by Dr. Rayann Heman cardiology. -myoview stress test -- negative.    3. Elevated troponin-suspected to be supply demand ischemia due to underlying CHF. -myoview negative  4. Abnormal LFTs-etiology unclear. Patient has no abdominal pain. -Questionable chronic passive congestion from the CHF.  -ultrasound right upper  quadrant -- no acute abnormality. Gallbladder absent. -LFTs trending down  5. Hyperlipidemia-hold Pravachol given abnormal LFTs.  6. Hypothyroidism-continue Synthroid.  7. Anemia-this is a normocytic anemia. I will check an anemia panel and iron studies  All much improved. Patient will discharged to home follow-up cardiology, heart failure clinic as outpatient CONSULTS OBTAINED:  Treatment Team:  Minna Merritts, MD Thompson Grayer, MD  DRUG ALLERGIES:   Allergies  Allergen Reactions  . Celebrex [Celecoxib] Swelling  . Mobic [Meloxicam] Swelling  . Adhesive [Tape] Itching and Rash    DISCHARGE MEDICATIONS:   Allergies as of 01/13/2019      Reactions   Celebrex [celecoxib] Swelling   Mobic [meloxicam] Swelling   Adhesive [tape] Itching, Rash      Medication List    TAKE these medications   alendronate 70 MG tablet Commonly known as: FOSAMAX Take 70 mg by mouth once a week. Take with a full glass of water on an empty stomach. Every Sunday   aspirin 81 MG tablet Take 81 mg by mouth daily.   CALCIUM 600 + D PO Take 1 tablet by mouth daily.   Cranberry-Vitamin C 140-100 MG Caps Take 1 capsule by mouth daily.   fexofenadine 180 MG tablet Commonly known as: ALLEGRA Take 180 mg by mouth daily.   Fish Oil 1000 MG Caps Take 1 capsule by mouth daily.   furosemide 20 MG tablet Commonly known as: LASIX Take 1 tablet (20 mg total) by mouth daily.   gabapentin 300 MG capsule Commonly known as: NEURONTIN Take 300 mg by mouth daily.   Glucosamine HCl 1500 MG Tabs Take 1 tablet by mouth  daily.   levothyroxine 150 MCG tablet Commonly known as: SYNTHROID Take 150 mcg by mouth daily before breakfast.   lidocaine 5 % Commonly known as: LIDODERM Place 1 patch onto the skin every 12 (twelve) hours as needed (back pain). Remove & Discard patch within 12 hours or as directed by MD   losartan 25 MG tablet Commonly known as: COZAAR Take 1 tablet (25 mg total)  by mouth daily.   Mucus Relief 400 MG Tabs tablet Generic drug: guaifenesin Take 400 mg by mouth daily.   MULTIVITAMIN & MINERAL PO Take 1 tablet by mouth daily.   omeprazole 40 MG capsule Commonly known as: PRILOSEC Take 40 mg by mouth daily.   pravastatin 40 MG tablet Commonly known as: PRAVACHOL Take 40 mg by mouth daily.   traMADol 50 MG tablet Commonly known as: ULTRAM Take 50 mg by mouth every 6 (six) hours as needed for moderate pain.            Durable Medical Equipment  (From admission, onward)         Start     Ordered   01/13/19 1340  DME Oxygen  Once    Question Answer Comment  Length of Need 6 Months   Mode or (Route) Nasal cannula   Liters per Minute 2   Frequency Continuous (stationary and portable oxygen unit needed)   Oxygen conserving device Yes   Oxygen delivery system Gas      01/13/19 1340          If you experience worsening of your admission symptoms, develop shortness of breath, life threatening emergency, suicidal or homicidal thoughts you must seek medical attention immediately by calling 911 or calling your MD immediately  if symptoms less severe.  You Must read complete instructions/literature along with all the possible adverse reactions/side effects for all the Medicines you take and that have been prescribed to you. Take any new Medicines after you have completely understood and accept all the possible adverse reactions/side effects.   Please note  You were cared for by a hospitalist during your hospital stay. If you have any questions about your discharge medications or the care you received while you were in the hospital after you are discharged, you can call the unit and asked to speak with the hospitalist on call if the hospitalist that took care of you is not available. Once you are discharged, your primary care physician will handle any further medical issues. Please note that NO REFILLS for any discharge medications will be  authorized once you are discharged, as it is imperative that you return to your primary care physician (or establish a relationship with a primary care physician if you do not have one) for your aftercare needs so that they can reassess your need for medications and monitor your lab values. Today   SUBJECTIVE   Feels a lot better. Breathing improved. Daughter in the room. Patient denies any chest pain. Had stress test in the morning  VITAL SIGNS:  Blood pressure 140/65, pulse (!) 109, temperature 98.4 F (36.9 C), temperature source Oral, resp. rate 18, height _0  (1.549 m), weight 73.8 kg, SpO2 95 %.  I/O:    Intake/Output Summary (Last 24 hours) at 01/13/2019 1341 Last data filed at 01/13/2019 1227 Gross per 24 hour  Intake 240 ml  Output 500 ml  Net -260 ml    PHYSICAL EXAMINATION:  GENERAL:  73 y.o.-year-old patient lying in the bed with no acute distress.  EYES: Pupils equal, round, reactive to light and accommodation. No scleral icterus. Extraocular muscles intact.  HEENT: Head atraumatic, normocephalic. Oropharynx and nasopharynx clear.  NECK:  Supple, no jugular venous distention. No thyroid enlargement, no tenderness.  LUNGS: Normal breath sounds bilaterally, no wheezing, rales,rhonchi or crepitation. No use of accessory muscles of respiration.  CARDIOVASCULAR: S1, S2 normal. No murmurs, rubs, or gallops.  ABDOMEN: Soft, non-tender, non-distended. Bowel sounds present. No organomegaly or mass.  EXTREMITIES: No pedal edema, cyanosis, or clubbing.  NEUROLOGIC: Cranial nerves II through XII are intact. Muscle strength 5/5 in all extremities. Sensation intact. Gait not checked.  PSYCHIATRIC: The patient is alert and oriented x 3.  SKIN: No obvious rash, lesion, or ulcer.   DATA REVIEW:   CBC  Recent Labs  Lab 01/13/19 1238  WBC 8.8  HGB 11.8*  HCT 41.3  PLT 351    Chemistries  Recent Labs  Lab 01/11/19 0322  01/12/19 1228  NA 140  --  136  K 3.3*   < > 4.0   CL 94*  --  85*  CO2 36*  --  38*  GLUCOSE 78  --  105*  BUN 14  --  15  CREATININE 0.43*  --  0.49  CALCIUM 8.2*  --  9.2  AST 260*  --   --   ALT 246*  --   --   ALKPHOS 71  --   --   BILITOT 0.8  --   --    < > = values in this interval not displayed.    Microbiology Results   Recent Results (from the past 240 hour(s))  SARS Coronavirus 2 Bruce order, Performed in [facility] hospital lab) Nasopharyngeal Nasopharyngeal Swab     Status: None   Collection Time: 01/10/19  4:50 PM   Specimen: Nasopharyngeal Swab  Result Value Ref Range Status   SARS Coronavirus 2 NEGATIVE NEGATIVE Final    Comment: (NOTE) If result is NEGATIVE SARS-CoV-2 target nucleic acids are NOT DETECTED. The SARS-CoV-2 RNA is generally detectable in upper and lower  respiratory specimens during the acute phase of infection. The lowest  concentration of SARS-CoV-2 viral copies this assay can detect is 250  copies / mL. A negative result does not preclude SARS-CoV-2 infection  and should not be used as the sole basis for treatment or other  patient management decisions.  A negative result may occur with  improper specimen collection / handling, submission of specimen other  than nasopharyngeal swab, presence of viral mutation(s) within the  areas targeted by this assay, and inadequate number of viral copies  (<250 copies / mL). A negative result must be combined with clinical  observations, patient history, and epidemiological information. If result is POSITIVE SARS-CoV-2 target nucleic acids are DETECTED. The SARS-CoV-2 RNA is generally detectable in upper and lower  respiratory specimens dur ing the acute phase of infection.  Positive  results are indicative of active infection with SARS-CoV-2.  Clinical  correlation with patient history and other diagnostic information is  necessary to determine patient infection status.  Positive results do  not rule out bacterial infection or co-infection with  other viruses. If result is PRESUMPTIVE POSTIVE SARS-CoV-2 nucleic acids MAY BE PRESENT.   A presumptive positive result was obtained on the submitted specimen  and confirmed on repeat testing.  While 2019 novel coronavirus  (SARS-CoV-2) nucleic acids may be present in the submitted sample  additional confirmatory testing may be necessary for epidemiological  and /  or clinical management purposes  to differentiate between  SARS-CoV-2 and other Sarbecovirus currently known to infect humans.  If clinically indicated additional testing with an alternate test  methodology [id number]) is advised. The SARS-CoV-2 RNA is generally  detectable in upper and lower respiratory sp ecimens during the acute  phase of infection. The expected result is Negative. Fact Sheet for Patients:  StrictlyIdeas.no Fact Sheet for Healthcare Providers: BankingDealers.co.za This test is not yet approved or cleared by the [place] FDA and has been authorized for detection and/or diagnosis of SARS-CoV-2 by FDA under an Emergency Use Authorization (EUA).  This EUA will remain in effect (meaning this test can be used) for the duration of the COVID-19 declaration under Section 564(b)(1) of the Act, 21 U.S.C. section 360bbb-3(b)(1), unless the authorization is terminated or revoked sooner. Performed at [facility], [address] [place], [place] [address]     RADIOLOGY:  Nm Myocar Multi W/spect Tamela Oddi Motion / Ef  Result Date: 01/13/2019  There was no ST segment deviation noted during stress.  No T wave inversion was noted during stress.  The study is normal.  This is a low risk study.  The left ventricular ejection fraction is normal (55-65%).      CODE STATUS:     Code Status Orders  (From admission, onward)         Start     Ordered   01/10/19 2151  Full code  Continuous     01/10/19 2150        Code Status History    This patient  has a current code status but no historical code status.   Advance Care Planning Activity    Advance Directive Documentation     Most Recent Value  Type of Advance Directive  Healthcare Power of Attorney  Pre-existing out of facility DNR order (yellow form or pink MOST form)  -  "MOST" Form in Place?  -      TOTAL TIME TAKING CARE OF THIS PATIENT: *40* minutes.    Fritzi Mandes M.D on 01/13/2019 at 1:41 PM  Between 7am to 6pm - Pager - [phone] After 6pm go to [email or web address] - password [email or web address] [facility]  Office  [phone]  CC: Primary care physician; Albina Billet, MD

## 2019-01-13 NOTE — Progress Notes (Signed)
Cardiovascular and Pulmonary Nurse Navigator Note:    73 year old female wit known history of GERD, osteoarthritis, depression, HLD, hypothyroidism, who presented to the ED with worsening SOB, lower extremity edema, BNP 531, CXR with probable vascular congestion.   Admission dx of acute respiratory with new onset diastolic CHF with h/o emphysema (seen on CT in past), elevated troponin felt to be supply demand ischemia, and abnormal LFTs.    CHF Education:?? This RN rounded on patient.  Patient sitting up in bed with HOB elevated.  Daughter at bedside.  This RN gave verbal permission for this RN to speak about her health in front of her daughter.    Educational session with patient / daughter  Completed.  Diastolic HF is a new diagnosis for patient.    Reviewed "Living Better with Heart Failure" packet. Briefly reviewed definition of heart failure and signs and symptoms of an exacerbation.?Discussed potential causes of CHF.  Explained to patient that HF is a chronic illness which requires self-assessment / self-management along with help from the cardiologist/PCP.? ? *Reviewed importance of and reason behind checking weight daily in the AM, after using the bathroom, but before getting dressed.  Patient has a functioning scale.  Patient agreed to start weighing herself every morning.    *Reviewed with patient the following information:  *Discussed when to call the Dr= weight gain of >2-3lb overnight of 5lb in a week, *Discussed yellow zone= call MD: weight gain of >2-3lb overnight of 5lb in a week, increased swelling, increased SOB when lying down, chest discomfort, dizziness, increased fatigue *Red Zone= call 911: struggle to breath, fainting or near fainting, significant chest pain ? *Heart Failure Zone Magnet given and reviewed with patient.   *Diet - Patient currently ordered a Heart Healthy Diet.  Instructed patient to follow a low sodium diet of 2000 mg or less.  Recommended foods for low  sodium heart healthy diet reviewed. Handouts provided and reviewed: Sodium Content of Foods and Low Sodium Nutrition Therapy.   Reviewed with patient steps to reading a food label with close attention to serving size and mg of sodium.   *Discussed fluid intake with patient as well. Patient not currently on a fluid restriction, but advised no more than 64 ounces of fluid per day.  Demonstrated this volume to patient using the bedside water pitcher.   *Instructed patient to take medications as prescribed for heart failure. Explained briefly why patient is on the medications (either make you feel better, live longer or keep you out of the hospital) and discussed monitoring and side effects.  *Discussed exercise / activity. Patient is being discharged on home oxygen therapy which is an acute need for this patient.  Patient has been ordered [facility] with PT and RN.    Encouraged patient to remain as active as possible.   *Smoking Cessation- Patient is a FORMER smoker.    *[facility] Failure [facility] - Explained the purpose of the HF Clinic. ?Explained to patient the HF Clinic does not replace PCP nor Cardiologist, but is an additional resource to helping patient manage heart failure at home. Patient's appointment scheduled for 01/20/2019 at  11:30 a.m.    Again, the 5 Steps to Living Better with Heart Failure were reviewed with patient.  "Guide to Understanding Heart Failure" by Health Monitor given to patient and daughter as an additional resource.    Patient / Daughter thanked me for providing the above information.   Roanna Epley, RN, BSN, [place]  [facility] Cardiac & Pulmonary Rehab  Cardiovascular & Pulmonary Nurse Navigator  Direct Line: [phone]  Department Phone #: [phone] Fax: [phone]  Email Address: [email or web address]  ?

## 2019-01-13 NOTE — Progress Notes (Signed)
Report given to Lew Dawes

## 2019-01-13 NOTE — Progress Notes (Signed)
SATURATION QUALIFICATIONS: (This note is used to comply with regulatory documentation for home oxygen)  Patient Saturations on Room Air at Rest = 84%  Patient Saturations on Room Air while Ambulating = n/a%  Patient Saturations on 2 Liters of oxygen while Ambulating = 95%  Please briefly explain why patient needs home oxygen: Patient's oxygen saturation at 84% while on room air. On 2L went up to 95%.

## 2019-01-19 NOTE — Progress Notes (Signed)
Patient ID: Carolyn Steele, female    DOB: 1946/01/08, 73 y.o.   MRN: [id number]  HPI  [name] is a 73 y/o female with a history of hyperlipidemia, thyroid disease, depression, previous tobacco use and chronic heart failure.   Echo report from 01/11/2019 reviewed and showed an EF of 60-65% and an elevated PA pressure of 51 mmHg.   Admitted 01/10/2019 due to acute heart failure. Cardiology consult obtained. Initially given IV lasix and then transitioned to oral diuretics. Diuresed >6L. REDS reading was 32%. Elevated troponin thought to be due to demand ischemia. Discharged after 3 days.   She presents today for her initial visit with a chief complaint of minimal shortness of breath upon moderate exertion. She describes this as chronic in nature having been present for several months. She has associated fatigue and slight pedal edema along with this. She denies any difficulty sleeping, dizziness, abdominal distention, palpitations, chest pain, cough or weight gain.   Past Medical History:  Diagnosis Date  . Acid reflux   . Arthritis   . Depression   . History of depression   . Hypercholesteremia   . Thyroid disease hypo  . Wears glasses    Past Surgical History:  Procedure Laterality Date  . CHOLECYSTECTOMY    . COLONOSCOPY    . FOOT SURGERY Right   . HAND SURGERY Bilateral   . SHOULDER SURGERY Left   . SPINAL CORD STIMULATOR INSERTION N/A 06/04/2015   Procedure: LUMBAR SPINAL CORD STIMULATOR INSERTION;  Surgeon: Clydell Hakim, MD;  Location: [facility];  Service: Neurosurgery;  Laterality: N/A;   Family History  Problem Relation Age of Onset  . Lung cancer Mother   . Heart failure Father   . Breast cancer Neg Hx    Social History   Tobacco Use  . Smoking status: Former Smoker    Packs/day: 1.00    Years: 40.00    Pack years: 40.00    Types: Cigarettes  . Smokeless tobacco: Never Used  . Tobacco comment: quit smoking approximately 8-10 years ago (05/28/2015)   Substance Use Topics  . Alcohol use: No   Allergies  Allergen Reactions  . Celebrex [Celecoxib] Swelling  . Mobic [Meloxicam] Swelling  . Adhesive [Tape] Itching and Rash   Prior to Admission medications   Medication Sig Start Date End Date Taking? Authorizing Provider  alendronate (FOSAMAX) 70 MG tablet Take 70 mg by mouth once a week. Take with a full glass of water on an empty stomach. Every Sunday   Yes [provider]  aspirin 81 MG tablet Take 81 mg by mouth daily.   Yes [provider]  Calcium Carb-Cholecalciferol (CALCIUM 600 + D PO) Take 1 tablet by mouth daily.   Yes [provider]  Cranberry-Vitamin C 140-100 MG CAPS Take 2 capsules by mouth daily.    Yes [provider]  Cyanocobalamin (VITAMIN B 12 PO) Take 1,000 mg by mouth daily.   Yes [provider]  fexofenadine (ALLEGRA) 180 MG tablet Take 180 mg by mouth daily.   Yes [provider]  furosemide (LASIX) 20 MG tablet Take 1 tablet (20 mg total) by mouth daily. 01/13/19  Yes Fritzi Mandes, MD  gabapentin (NEURONTIN) 300 MG capsule Take 300 mg by mouth daily.   Yes [provider]  Glucosamine HCl 1500 MG TABS Take 1 tablet by mouth daily.   Yes [provider]  guaifenesin (MUCUS RELIEF) 400 MG TABS tablet Take 400 mg by  mouth daily.   Yes [provider]  levothyroxine (SYNTHROID, LEVOTHROID) 150 MCG tablet Take 150 mcg by mouth daily before breakfast. Take 1 tablet M, T, Thurs, Fri and Sat. Take 1/2 tablet Wednesday and Skip Sunday.   Yes [provider]  losartan (COZAAR) 25 MG tablet Take 1 tablet (25 mg total) by mouth daily. 01/13/19  Yes Fritzi Mandes, MD  Multiple Vitamins-Minerals (MULTIVITAMIN & MINERAL PO) Take 1 tablet by mouth daily.   Yes [provider]  Omega-3 Fatty Acids (FISH OIL) 1000 MG CAPS Take 500 mg by mouth daily.    Yes [provider]  omeprazole (PRILOSEC) 40 MG capsule Take 40 mg by mouth  daily.   Yes [provider]  pravastatin (PRAVACHOL) 40 MG tablet Take 40 mg by mouth daily.   Yes [provider]  traMADol (ULTRAM) 50 MG tablet Take 50 mg by mouth every 6 (six) hours as needed for moderate pain.    Yes [provider]  lidocaine (LIDODERM) 5 % Place 1 patch onto the skin every 12 (twelve) hours as needed (back pain). Remove & Discard patch within 12 hours or as directed by MD    [provider]    Review of Systems  Constitutional: Positive for fatigue (with moderate exertion). Negative for appetite change.  HENT: Positive for hearing loss. Negative for congestion and sore throat.   Eyes: Negative.   Respiratory: Positive for shortness of breath. Negative for cough.   Cardiovascular: Positive for leg swelling. Negative for chest pain and palpitations.  Gastrointestinal: Negative for abdominal distention and abdominal pain.  Endocrine: Negative.   Genitourinary: Negative.   Musculoskeletal: Negative for back pain and neck pain.  Skin: Negative.   Allergic/Immunologic: Negative.   Neurological: Negative for dizziness and light-headedness.  Hematological: Negative for adenopathy. Does not bruise/bleed easily.  Psychiatric/Behavioral: Negative for dysphoric mood and sleep disturbance (sleeping on 2 pillows; wearing oxygen at 2L around the clock). The patient is not nervous/anxious.     Vitals:   01/20/19 1134  BP: 140/65  Pulse: 100  Resp: 18  SpO2: 99%  Weight: 165 lb (74.8 kg)  Height: _0  (1.549 m)   Wt Readings from Last 3 Encounters:  01/20/19 165 lb (74.8 kg)  01/13/19 162 lb 11.2 oz (73.8 kg)  06/04/15 172 lb 8 oz (78.2 kg)   Lab Results  Component Value Date   CREATININE 0.49 01/12/2019   CREATININE 0.43 (L) 01/11/2019   CREATININE 0.46 01/10/2019     Physical Exam Vitals signs and nursing note reviewed.  Constitutional:      Appearance: Normal appearance.  HENT:     Head: Normocephalic and atraumatic.      Right Ear: Decreased hearing noted.     Left Ear: Decreased hearing noted.  Neck:     Musculoskeletal: Normal range of motion and neck supple.  Cardiovascular:     Rate and Rhythm: Regular rhythm. Tachycardia present.  Pulmonary:     Effort: Pulmonary effort is normal. No respiratory distress.     Breath sounds: No wheezing or rales.  Abdominal:     Palpations: Abdomen is soft.     Tenderness: There is no abdominal tenderness.  Musculoskeletal:        General: No tenderness.     Right lower leg: Edema (trace pitting) present.     Left lower leg: Edema (trace pitting) present.  Skin:    General: Skin is warm and dry.  Neurological:  General: No focal deficit present.     Mental Status: She is alert and oriented to person, place, and time.  Psychiatric:        Mood and Affect: Mood normal.        Behavior: Behavior normal.        Thought Content: Thought content normal.     Assessment & Plan:  1: Chronic heart failure with preserved ejection fraction- - NYHA class II - euvolemic today - weighing daily and home weight chart was reviewed; instructed to call for an overnight weight gain of >2 pounds or a weekly weight gain of >5 pounds - not adding salt and is using pepper; understands the importance of reading food labels for sodium content and to keep sodium level <2074m / day - trying to increase her activity at home - wearing oxygen at 2L around the clock - has appointment with cardiology (Sharolyn Douglas 01/23/2019 - BNP 01/10/2019 was 531.0  2: Depression- - BMP from 01/12/2019 reviewed and showed sodium 136, potassium 4.0, creatinine 0.49 and GFR >60 - sees PCP (Hall Busing - taking levothyroxine  Patient did not bring her medications nor a list. Each medication was verbally reviewed with the patient and she was encouraged to bring the bottles to every visit to confirm accuracy of list.  Return in 6 weeks or sooner for any questions/problems before then.

## 2019-01-20 ENCOUNTER — Ambulatory Visit: Payer: Medicare Other | Attending: Family | Admitting: Family

## 2019-01-20 ENCOUNTER — Encounter: Payer: Self-pay | Admitting: Family

## 2019-01-20 ENCOUNTER — Other Ambulatory Visit: Payer: Self-pay

## 2019-01-20 VITALS — BP 140/65 | HR 100 | Resp 18 | Ht 61.0 in | Wt 165.0 lb

## 2019-01-20 DIAGNOSIS — R5383 Other fatigue: Secondary | ICD-10-CM | POA: Insufficient documentation

## 2019-01-20 DIAGNOSIS — E7800 Pure hypercholesterolemia, unspecified: Secondary | ICD-10-CM | POA: Insufficient documentation

## 2019-01-20 DIAGNOSIS — E079 Disorder of thyroid, unspecified: Secondary | ICD-10-CM | POA: Diagnosis not present

## 2019-01-20 DIAGNOSIS — Z87891 Personal history of nicotine dependence: Secondary | ICD-10-CM | POA: Insufficient documentation

## 2019-01-20 DIAGNOSIS — E785 Hyperlipidemia, unspecified: Secondary | ICD-10-CM | POA: Diagnosis not present

## 2019-01-20 DIAGNOSIS — M1990 Unspecified osteoarthritis, unspecified site: Secondary | ICD-10-CM | POA: Insufficient documentation

## 2019-01-20 DIAGNOSIS — Z7982 Long term (current) use of aspirin: Secondary | ICD-10-CM | POA: Insufficient documentation

## 2019-01-20 DIAGNOSIS — F329 Major depressive disorder, single episode, unspecified: Secondary | ICD-10-CM | POA: Insufficient documentation

## 2019-01-20 DIAGNOSIS — I5032 Chronic diastolic (congestive) heart failure: Secondary | ICD-10-CM | POA: Diagnosis not present

## 2019-01-20 DIAGNOSIS — K219 Gastro-esophageal reflux disease without esophagitis: Secondary | ICD-10-CM | POA: Insufficient documentation

## 2019-01-20 DIAGNOSIS — R0602 Shortness of breath: Secondary | ICD-10-CM | POA: Insufficient documentation

## 2019-01-20 DIAGNOSIS — Z8249 Family history of ischemic heart disease and other diseases of the circulatory system: Secondary | ICD-10-CM | POA: Insufficient documentation

## 2019-01-20 DIAGNOSIS — Z79899 Other long term (current) drug therapy: Secondary | ICD-10-CM | POA: Insufficient documentation

## 2019-01-20 NOTE — Patient Instructions (Signed)
Continue weighing daily and call for an overnight weight gain of > 2 pounds or a weekly weight gain of >5 pounds.

## 2019-01-23 ENCOUNTER — Encounter: Payer: Self-pay | Admitting: Nurse Practitioner

## 2019-01-23 ENCOUNTER — Other Ambulatory Visit: Payer: Self-pay

## 2019-01-23 ENCOUNTER — Ambulatory Visit (INDEPENDENT_AMBULATORY_CARE_PROVIDER_SITE_OTHER): Payer: Medicare Other | Admitting: Nurse Practitioner

## 2019-01-23 VITALS — BP 120/62 | HR 99 | Temp 97.7°F | Ht 61.0 in | Wt 162.5 lb

## 2019-01-23 DIAGNOSIS — I10 Essential (primary) hypertension: Secondary | ICD-10-CM

## 2019-01-23 DIAGNOSIS — E782 Mixed hyperlipidemia: Secondary | ICD-10-CM

## 2019-01-23 DIAGNOSIS — I5032 Chronic diastolic (congestive) heart failure: Secondary | ICD-10-CM | POA: Diagnosis not present

## 2019-01-23 MED ORDER — FUROSEMIDE 20 MG PO TABS: 20.0000 mg | ORAL_TABLET | Freq: Every day | ORAL | 6 refills | Status: DC

## 2019-01-23 MED ORDER — LOSARTAN POTASSIUM 25 MG PO TABS: 25.0000 mg | ORAL_TABLET | Freq: Every day | ORAL | 6 refills | Status: DC

## 2019-01-23 NOTE — Patient Instructions (Signed)
Medication Instructions:  Your physician recommends that you continue on your current medications as directed. Please refer to the Current Medication list given to you today. If you need a refill on your cardiac medications before your next appointment, please call your pharmacy.   Lab work: Your physician recommends that you have lab work today(BMET) If you have labs (blood work) drawn today and your tests are completely normal, you will receive your results only by: Marland Kitchen MyChart Message (if you have MyChart) OR . A paper copy in the mail If you have any lab test that is abnormal or we need to change your treatment, we will call you to review the results.  Testing/Procedures: None ordered   Follow-Up: At [facility], you and your health needs are our priority.  As part of our continuing mission to provide you with exceptional heart care, we have created designated Provider Care Teams.  These Care Teams include your primary Cardiologist (physician) and Advanced Practice Providers (APPs -  Physician Assistants and Nurse Practitioners) who all work together to provide you with the care you need, when you need it. You will need a follow up appointment in 2-3 months. You may see Kathlyn Sacramento, MD or Murray Hodgkins, NP.

## 2019-01-23 NOTE — Progress Notes (Signed)
Office Visit    Patient Name: Carolyn Steele Date of Encounter: 01/23/2019  Primary Care Provider:  Albina Billet, MD Primary Cardiologist:  Kathlyn Sacramento, MD  Chief Complaint    73 year old female with a history of hypertension, hyperlipidemia, COPD now on home O2, GERD, hypothyroidism, and depression, who presents for post hospital follow-up after admission for hypertensive urgency and diastolic heart failure.  Past Medical History    Past Medical History:  Diagnosis Date   (HFpEF) heart failure with preserved ejection fraction ([place])    a. 12/2018 Echo: EF 60-65%, pseudonormalization. Nl RV fxn. RVSP 51 mmHg. Mild MR/TR.   Acid reflux    Arthritis    COPD (chronic obstructive pulmonary disease) ([place])    a. Home O2 started 12/2018.   Demand ischemia ([place])    a. 12/2018 mild hsTrop elevation in setting of diast chf/htn urgency-->MV: no ischemia/infarct. EF 55-65%.   Depression    Hypercholesteremia    Hypothyroidism    Wears glasses    Past Surgical History:  Procedure Laterality Date   CHOLECYSTECTOMY     COLONOSCOPY     FOOT SURGERY Right    HAND SURGERY Bilateral    SHOULDER SURGERY Left    SPINAL CORD STIMULATOR INSERTION N/A 06/04/2015   Procedure: LUMBAR SPINAL CORD STIMULATOR INSERTION;  Surgeon: Clydell Hakim, MD;  Location: [facility];  Service: Neurosurgery;  Laterality: N/A;    Allergies  Allergies  Allergen Reactions   Celebrex [Celecoxib] Swelling   Mobic [Meloxicam] Swelling   Adhesive [Tape] Itching and Rash    History of Present Illness    73 year old female with the above past medical history including remote tobacco abuse, COPD, hypertension, hyperlipidemia, GERD, hypothyroidism, and depression.  Earlier this month, she was admitted to [facility] with progressive dyspnea on exertion and cough.  She was hypertensive on arrival and found to have cardiomegaly, small bilateral pleural effusions, and vascular congestion.   She was admitted for heart failure.  Echo showed normal LV function with diastolic dysfunction.  She was aggressively diuresed.  She did have mild troponin elevation and stress testing was undertaken and showed no evidence of ischemia or infarct.  She was subsequently discharged.  She says that since discharge she has done well.  She has been weighing herself daily and her weight is been stable.  She denies chest pain, dyspnea, palpitations, PND, orthopnea, dizziness, syncope, edema, or early satiety.  She is receiving physical therapy and says the other day she walked outside without oxygen and maintain an oxygen saturation of 95%.  Home Medications    Prior to Admission medications   Medication Sig Start Date End Date Taking? Authorizing Provider  alendronate (FOSAMAX) 70 MG tablet Take 70 mg by mouth once a week. Take with a full glass of water on an empty stomach. Every Sunday   Yes [provider]  aspirin 81 MG tablet Take 81 mg by mouth daily.   Yes [provider]  Calcium Carb-Cholecalciferol (CALCIUM 600 + D PO) Take 1 tablet by mouth daily.   Yes [provider]  Cranberry-Vitamin C 140-100 MG CAPS Take 2 capsules by mouth daily.    Yes [provider]  Cyanocobalamin (VITAMIN B 12 PO) Take 1,000 mg by mouth daily.   Yes [provider]  fexofenadine (ALLEGRA) 180 MG tablet Take 180 mg by mouth daily.   Yes [provider]  furosemide (LASIX) 20 MG tablet Take 1 tablet (20 mg total) by  mouth daily. 01/13/19  Yes Fritzi Mandes, MD  gabapentin (NEURONTIN) 300 MG capsule Take 300 mg by mouth daily.   Yes [provider]  Glucosamine HCl 1500 MG TABS Take 1 tablet by mouth daily.   Yes [provider]  guaifenesin (MUCUS RELIEF) 400 MG TABS tablet Take 400 mg by mouth daily.   Yes [provider]  levothyroxine (SYNTHROID, LEVOTHROID) 150 MCG tablet Take 150 mcg by mouth daily before breakfast. Take 1 tablet M, T,  Thurs, Fri and Sat. Take 1/2 tablet Wednesday and Skip Sunday.   Yes [provider]  lidocaine (LIDODERM) 5 % Place 1 patch onto the skin every 12 (twelve) hours as needed (back pain). Remove & Discard patch within 12 hours or as directed by MD   Yes [provider]  losartan (COZAAR) 25 MG tablet Take 1 tablet (25 mg total) by mouth daily. 01/13/19  Yes Fritzi Mandes, MD  Multiple Vitamins-Minerals (MULTIVITAMIN & MINERAL PO) Take 1 tablet by mouth daily.   Yes [provider]  Omega-3 Fatty Acids (FISH OIL) 1000 MG CAPS Take 500 mg by mouth daily.    Yes [provider]  omeprazole (PRILOSEC) 40 MG capsule Take 40 mg by mouth daily.   Yes [provider]  OXYGEN Inhale 2 L into the lungs daily.   Yes [provider]  pravastatin (PRAVACHOL) 40 MG tablet Take 40 mg by mouth daily.   Yes [provider]  traMADol (ULTRAM) 50 MG tablet Take 50 mg by mouth every 6 (six) hours as needed for moderate pain.    Yes [provider]    Review of Systems    She denies chest pain, palpitations, dyspnea, pnd, orthopnea, n, v, dizziness, syncope, edema, weight gain, or early satiety.  All other systems reviewed and are otherwise negative except as noted above.  Physical Exam    VS:  BP 120/62 (BP Location: Left Arm, Patient Position: Sitting, Cuff Size: Normal)    Pulse 99    Temp 97.7 F (36.5 C)    Ht _0  (1.549 m)    Wt 162 lb 8 oz (73.7 kg)    BMI 30.70 kg/m  , BMI Body mass index is 30.7 kg/m. GEN: Well nourished, well developed, in no acute distress. HEENT: normal. Neck: Supple, no JVD, carotid bruits, or masses. Cardiac: RRR, no murmurs, rubs, or gallops. No clubbing, cyanosis, edema.  Radials/PT 1+ and equal bilaterally.  Respiratory:  Respirations regular and unlabored, diminished breath sounds bilaterally. GI: Soft, nontender, nondistended, BS + x 4. MS: no deformity or atrophy. Skin: warm and dry, no rash. Neuro:   Strength and sensation are intact. Psych: Normal affect.  Accessory Clinical Findings    ECG personally reviewed by me today -regular sinus rhythm, 99 - no acute changes.  Lab Results  Component Value Date   WBC 8.8 01/13/2019   HGB 11.8 (L) 01/13/2019   HCT 41.3 01/13/2019   MCV 79.4 (L) 01/13/2019   PLT 351 01/13/2019   Lab Results  Component Value Date   CREATININE 0.49 01/12/2019   BUN 15 01/12/2019   NA 136 01/12/2019   K 4.0 01/12/2019   CL 85 (L) 01/12/2019   CO2 38 (H) 01/12/2019   Lab Results  Component Value Date   ALT 246 (H) 01/11/2019   AST 260 (H) 01/11/2019   ALKPHOS 71 01/11/2019   BILITOT 0.8 01/11/2019   Lab Results  Component Value Date   CHOL 187 01/13/2019  HDL 69 01/13/2019   LDLCALC 101 (H) 01/13/2019   TRIG 84 01/13/2019   CHOLHDL 2.7 01/13/2019     Assessment & Plan    1.  Chronic diastolic congestive heart failure: Recent admission with progressive dyspnea and volume overload.  Found to have normal LV function.  She had mild troponin elevation but a nonischemic stress test.  She has done well since discharge and weight has been stable.  She is euvolemic on examination today.  Blood pressure is 120/62.  Heart rate mildly elevated at 99.  Continue losartan therapy.  2.  COPD: Patient now on home O2.  She says that she walked with physical therapy outside the other day and maintain a saturation of 95% however, we ambulated her today without oxygen and she dropped to 77%.  Continue home O2.  3.  Essential hypertension: Stable at 120/62 on losartan and low-dose Lasix therapy.  I will follow-up a basic metabolic panel and refill both.  4.  Hyperlipidemia: Recent LDL of 101 on Pravachol.  5.  Disposition: Follow-up in clinic in 2 to 3 months or sooner if necessary.   Murray Hodgkins, NP 01/23/2019, 6:56 PM

## 2019-01-24 ENCOUNTER — Other Ambulatory Visit: Payer: Self-pay

## 2019-01-24 ENCOUNTER — Telehealth: Payer: Self-pay

## 2019-01-24 DIAGNOSIS — E782 Mixed hyperlipidemia: Secondary | ICD-10-CM

## 2019-01-24 DIAGNOSIS — I10 Essential (primary) hypertension: Secondary | ICD-10-CM

## 2019-01-24 DIAGNOSIS — I5032 Chronic diastolic (congestive) heart failure: Secondary | ICD-10-CM

## 2019-01-24 LAB — BASIC METABOLIC PANEL
BUN/Creatinine Ratio: 20 (ref 12–28)
BUN: 10 mg/dL (ref 8–27)
CO2: 33 mmol/L — ABNORMAL HIGH (ref 20–29)
Calcium: 9.3 mg/dL (ref 8.7–10.3)
Chloride: 93 mmol/L — ABNORMAL LOW (ref 96–106)
Creatinine, Ser: 0.5 mg/dL — ABNORMAL LOW (ref 0.57–1.00)
GFR calc Af Amer: 112 mL/min/{1.73_m2} (ref >59)
GFR calc non Af Amer: 97 mL/min/{1.73_m2} (ref >59)
Glucose: 96 mg/dL (ref 65–99)
Potassium: 5.1 mmol/L (ref 3.5–5.2)
Sodium: 137 mmol/L (ref 134–144)

## 2019-01-24 NOTE — Telephone Encounter (Signed)
Pt advised and will mark her calendar for 2 [facility]

## 2019-01-24 NOTE — Telephone Encounter (Signed)
-----  Message from Theora Gianotti, NP sent at 01/24/2019  9:13 AM EDT ----- Kidney fxn ok.  Potassium is on high end of normal.  With recent start of losartan, will just want to make sure that this remains stable.  F/u bmet in 2 wks.

## 2019-02-06 ENCOUNTER — Other Ambulatory Visit
Admission: RE | Admit: 2019-02-06 | Discharge: 2019-02-06 | Disposition: A | Discharge status: Home / Self Care | Payer: Medicare Other | Source: Ambulatory Visit | Attending: Nurse Practitioner | Admitting: Nurse Practitioner

## 2019-02-06 DIAGNOSIS — I5032 Chronic diastolic (congestive) heart failure: Secondary | ICD-10-CM | POA: Diagnosis present

## 2019-02-06 LAB — BASIC METABOLIC PANEL
Anion gap: 7 (ref 5–15)
BUN: 12 mg/dL (ref 8–23)
CO2: 34 mmol/L — ABNORMAL HIGH (ref 22–32)
Calcium: 9 mg/dL (ref 8.9–10.3)
Chloride: 96 mmol/L — ABNORMAL LOW (ref 98–111)
Creatinine, Ser: 0.57 mg/dL (ref 0.44–1.00)
GFR calc Af Amer: >60 mL/min (ref >60)
GFR calc non Af Amer: >60 mL/min (ref >60)
Glucose, Bld: 94 mg/dL (ref 70–99)
Potassium: 4.3 mmol/L (ref 3.5–5.1)
Sodium: 137 mmol/L (ref 135–145)

## 2019-02-06 NOTE — Addendum Note (Signed)
Addended by: Verlon Au on: 02/06/2019 09:13 AM   Modules accepted: Orders

## 2019-02-07 ENCOUNTER — Telehealth: Payer: Self-pay

## 2019-02-07 NOTE — Telephone Encounter (Signed)
Call to patient to lab results. No new orders at this time.   Pt verbalized understanding and had no further questions.

## 2019-02-07 NOTE — Telephone Encounter (Signed)
-----  Message from Theora Gianotti, NP sent at 02/06/2019  5:30 PM EDT ----- Renal fxn and lytes (particularly K) stable.

## 2019-02-18 ENCOUNTER — Other Ambulatory Visit: Payer: Self-pay | Admitting: *Deleted

## 2019-02-18 MED ORDER — FUROSEMIDE 20 MG PO TABS: 20.0000 mg | ORAL_TABLET | Freq: Every day | ORAL | 0 refills | Status: DC

## 2019-02-19 ENCOUNTER — Other Ambulatory Visit: Payer: Self-pay

## 2019-02-19 MED ORDER — LOSARTAN POTASSIUM 25 MG PO TABS: 25.0000 mg | ORAL_TABLET | Freq: Every day | ORAL | 3 refills | Status: DC

## 2019-03-03 ENCOUNTER — Ambulatory Visit: Payer: Medicare Other | Attending: Family | Admitting: Family

## 2019-03-03 ENCOUNTER — Other Ambulatory Visit: Payer: Self-pay

## 2019-03-03 ENCOUNTER — Encounter: Payer: Self-pay | Admitting: Family

## 2019-03-03 VITALS — BP 145/71 | HR 96 | Resp 22 | Ht 61.0 in | Wt 154.0 lb

## 2019-03-03 DIAGNOSIS — Z8249 Family history of ischemic heart disease and other diseases of the circulatory system: Secondary | ICD-10-CM | POA: Diagnosis not present

## 2019-03-03 DIAGNOSIS — J449 Chronic obstructive pulmonary disease, unspecified: Secondary | ICD-10-CM | POA: Diagnosis not present

## 2019-03-03 DIAGNOSIS — R5383 Other fatigue: Secondary | ICD-10-CM | POA: Insufficient documentation

## 2019-03-03 DIAGNOSIS — E079 Disorder of thyroid, unspecified: Secondary | ICD-10-CM | POA: Insufficient documentation

## 2019-03-03 DIAGNOSIS — E039 Hypothyroidism, unspecified: Secondary | ICD-10-CM | POA: Insufficient documentation

## 2019-03-03 DIAGNOSIS — Z79899 Other long term (current) drug therapy: Secondary | ICD-10-CM | POA: Diagnosis not present

## 2019-03-03 DIAGNOSIS — R05 Cough: Secondary | ICD-10-CM | POA: Diagnosis not present

## 2019-03-03 DIAGNOSIS — M1990 Unspecified osteoarthritis, unspecified site: Secondary | ICD-10-CM | POA: Insufficient documentation

## 2019-03-03 DIAGNOSIS — I110 Hypertensive heart disease with heart failure: Secondary | ICD-10-CM | POA: Insufficient documentation

## 2019-03-03 DIAGNOSIS — I5032 Chronic diastolic (congestive) heart failure: Secondary | ICD-10-CM | POA: Diagnosis not present

## 2019-03-03 DIAGNOSIS — J3489 Other specified disorders of nose and nasal sinuses: Secondary | ICD-10-CM | POA: Diagnosis not present

## 2019-03-03 DIAGNOSIS — E785 Hyperlipidemia, unspecified: Secondary | ICD-10-CM | POA: Diagnosis not present

## 2019-03-03 DIAGNOSIS — K219 Gastro-esophageal reflux disease without esophagitis: Secondary | ICD-10-CM | POA: Insufficient documentation

## 2019-03-03 DIAGNOSIS — Z87891 Personal history of nicotine dependence: Secondary | ICD-10-CM | POA: Diagnosis not present

## 2019-03-03 DIAGNOSIS — I10 Essential (primary) hypertension: Secondary | ICD-10-CM

## 2019-03-03 DIAGNOSIS — Z7982 Long term (current) use of aspirin: Secondary | ICD-10-CM | POA: Insufficient documentation

## 2019-03-03 DIAGNOSIS — E7800 Pure hypercholesterolemia, unspecified: Secondary | ICD-10-CM | POA: Insufficient documentation

## 2019-03-03 DIAGNOSIS — R0602 Shortness of breath: Secondary | ICD-10-CM | POA: Insufficient documentation

## 2019-03-03 NOTE — Progress Notes (Signed)
Patient ID: Carolyn Steele, female    DOB: 1945-09-01, 73 y.o.   MRN: [id number]  Carolyn Steele is a 73 y/o female with a history of hyperlipidemia, thyroid disease, depression, previous tobacco use and chronic heart failure.   Echo report from 01/11/2019 reviewed and showed an EF of 60-65% and an elevated PA pressure of 51 mmHg.   Admitted 01/10/2019 due to acute heart failure. Cardiology consult obtained. Initially given IV lasix and then transitioned to oral diuretics. Diuresed >6L. REDS reading was 32%. Elevated troponin thought to be due to demand ischemia. Discharged after 3 days.   She presents today for her follow-up visit with a chief complaint of improving shortness of breath and fatigue with moderate exertion. She noted that she woke up today with a cough and rhinorrhea. She denies chest pain, leg swelling, palpitations, abdominal distention, dizziness, and trouble sleeping. She has been weighing herself daily and her weight has trended down since she was discharged from the hospital in September. She has not been adding salt to her food and looks at the nutrition label. She wears 2L oxygen around the clock. She tries to walk and does housework for exercise.   Past Medical History:  Diagnosis Date  . (HFpEF) heart failure with preserved ejection fraction ([place])    a. 12/2018 Echo: EF 60-65%, pseudonormalization. Nl RV fxn. RVSP 51 mmHg. Mild MR/TR.  Marland Kitchen Acid reflux   . Arthritis   . CHF (congestive heart failure) ([place])   . COPD (chronic obstructive pulmonary disease) ([place])    a. Home O2 started 12/2018.  Marland Kitchen Demand ischemia ([place])    a. 12/2018 mild hsTrop elevation in setting of diast chf/htn urgency-->MV: no ischemia/infarct. EF 55-65%.  . Depression   . Hypercholesteremia   . Hypothyroidism   . Wears glasses    Past Surgical History:  Procedure Laterality Date  . CHOLECYSTECTOMY    . COLONOSCOPY    . FOOT SURGERY Right   . HAND SURGERY Bilateral   . SHOULDER SURGERY Left   .  SPINAL CORD STIMULATOR INSERTION N/A 06/04/2015   Procedure: LUMBAR SPINAL CORD STIMULATOR INSERTION;  Surgeon: Clydell Hakim, MD;  Location: [place];  Service: Neurosurgery;  Laterality: N/A;   Family History  Problem Relation Age of Onset  . Lung cancer Mother   . Heart failure Father   . Breast cancer Neg Hx    Social History   Tobacco Use  . Smoking status: Former Smoker    Packs/day: 1.00    Years: 40.00    Pack years: 40.00    Types: Cigarettes  . Smokeless tobacco: Never Used  . Tobacco comment: quit smoking approximately 8-10 years ago (05/28/2015)  Substance Use Topics  . Alcohol use: No   Allergies  Allergen Reactions  . Celebrex [Celecoxib] Swelling  . Mobic [Meloxicam] Swelling  . Adhesive [Tape] Itching and Rash   Prior to Admission medications   Medication Sig Start Date End Date Taking? Authorizing Provider  alendronate (FOSAMAX) 70 MG tablet Take 70 mg by mouth once a week. Take with a full glass of water on an empty stomach. Every Sunday   Yes [provider]  aspirin 81 MG tablet Take 81 mg by mouth daily.   Yes [provider]  Calcium Carb-Cholecalciferol (CALCIUM 600 + D PO) Take 1 tablet by mouth daily.   Yes [provider]  Cranberry-Vitamin C 140-100 MG CAPS Take 2 capsules by mouth daily.    Yes [provider]  Cyanocobalamin (VITAMIN B 12 PO) Take 1,000 mg by mouth daily.   Yes [provider]  fexofenadine (ALLEGRA) 180 MG tablet Take 180 mg by mouth daily.   Yes [provider]  furosemide (LASIX) 20 MG tablet Take 1 tablet (20 mg total) by mouth daily. 02/18/19  Yes Theora Gianotti, NP  gabapentin (NEURONTIN) 300 MG capsule Take 300 mg by mouth daily.   Yes [provider]  Glucosamine HCl 1500 MG TABS Take 1 tablet by mouth daily.   Yes [provider]  levothyroxine (SYNTHROID, LEVOTHROID) 150 MCG tablet Take 150 mcg by mouth daily before breakfast. Take 1 tablet  M, T, Thurs, Fri and Sat. Take 1/2 tablet Wednesday and Skip Sunday.   Yes [provider]  losartan (COZAAR) 25 MG tablet Take 1 tablet (25 mg total) by mouth daily. 02/19/19  Yes Theora Gianotti, NP  Multiple Vitamins-Minerals (MULTIVITAMIN & MINERAL PO) Take 1 tablet by mouth daily.   Yes [provider]  Omega-3 Fatty Acids (FISH OIL) 1000 MG CAPS Take 500 mg by mouth daily.    Yes [provider]  omeprazole (PRILOSEC) 40 MG capsule Take 40 mg by mouth daily.   Yes [provider]  OXYGEN Inhale 2 L into the lungs daily.   Yes [provider]  pravastatin (PRAVACHOL) 40 MG tablet Take 40 mg by mouth daily.   Yes [provider]  traMADol (ULTRAM) 50 MG tablet Take 50 mg by mouth every 6 (six) hours as needed for moderate pain.    Yes [provider]  guaifenesin (MUCUS RELIEF) 400 MG TABS tablet Take 400 mg by mouth daily.    [provider]  lidocaine (LIDODERM) 5 % Place 1 patch onto the skin every 12 (twelve) hours as needed (back pain). Remove & Discard patch within 12 hours or as directed by MD    [provider]    Review of Systems  Constitutional: Positive for fatigue (improving). Negative for appetite change.  HENT: Positive for hearing loss and rhinorrhea. Negative for congestion and sore throat.   Eyes: Negative.   Respiratory: Positive for cough and shortness of breath (improving).   Cardiovascular: Negative for chest pain, palpitations and leg swelling.  Gastrointestinal: Negative for abdominal distention and abdominal pain.  Endocrine: Negative.   Genitourinary: Negative.   Musculoskeletal: Negative for back pain and neck pain.  Skin: Negative.   Allergic/Immunologic: Negative.   Neurological: Negative for dizziness and light-headedness.  Hematological: Negative for adenopathy. Does not bruise/bleed easily.  Psychiatric/Behavioral: Negative for dysphoric mood and sleep disturbance  (sleeping on 2 pillows; wearing oxygen at 2L around the clock). The patient is not nervous/anxious.     Vitals:   03/03/19 1445  BP: (!) 145/71  Pulse: 96  Resp: (!) 22  SpO2: 99%  Weight: 154 lb (69.9 kg)  Height: _0  (1.549 m)   Wt Readings from Last 3 Encounters:  03/03/19 154 lb (69.9 kg)  01/23/19 162 lb 8 oz (73.7 kg)  01/20/19 165 lb (74.8 kg)   Lab Results  Component Value Date   CREATININE 0.57 02/06/2019   CREATININE 0.50 (L) 01/23/2019   CREATININE 0.49 01/12/2019    Physical Exam Vitals signs and nursing note reviewed.  Constitutional:      Appearance: Normal appearance.  HENT:     Head: Normocephalic and atraumatic.     Right Ear: Decreased hearing noted.     Left Ear: Decreased hearing noted.  Neck:  Musculoskeletal: Normal range of motion and neck supple.  Cardiovascular:     Rate and Rhythm: Normal rate and regular rhythm.  Pulmonary:     Effort: Pulmonary effort is normal. No respiratory distress.     Breath sounds: Rhonchi (lower lobes, clears with coughing) present. No wheezing or rales.  Abdominal:     Palpations: Abdomen is soft.     Tenderness: There is no abdominal tenderness.  Musculoskeletal:        General: No tenderness.     Right lower leg: No edema.     Left lower leg: No edema.  Skin:    General: Skin is warm and dry.  Neurological:     General: No focal deficit present.     Mental Status: She is alert and oriented to person, place, and time.  Psychiatric:        Mood and Affect: Mood normal.        Behavior: Behavior normal.        Thought Content: Thought content normal.     Assessment & Plan:  1: Chronic heart failure with preserved ejection fraction- - NYHA class II - euvolemic today - weighing daily and home weight chart was reviewed; instructed to call for an overnight weight gain of >2 pounds or a weekly weight gain of >5 pounds - not adding salt and is using pepper; understands the importance of reading food  labels for sodium content and to keep sodium level <2049m / day - trying to increase her activity at home - wearing oxygen at 2L around the clock - saw cardiology (Sharolyn Douglas 01/23/2019 - BNP 01/10/2019 was 531.0 - she may take Claritin or zyrtec/generics for rhinorrhea   2: Hypertension- - BP slightly elevated today - BMP from 01/12/2019 reviewed and showed sodium 136, potassium 4.0, creatinine 0.49 and GFR >60 - sees PCP (Hall Busing - taking levothyroxine  Medication list reviewed.   Return in 3 months or sooner for any questions/problems before then.

## 2019-03-03 NOTE — Patient Instructions (Addendum)
Continue weighing daily and call for an overnight weight gain of > 2 pounds or a weekly weight gain of >5 pounds.  You may take over the counter claritin (loratadine) or zyrtec (or generic)

## 2019-03-04 ENCOUNTER — Encounter: Payer: Self-pay | Admitting: Family

## 2019-03-25 ENCOUNTER — Encounter: Payer: Self-pay | Admitting: Nurse Practitioner

## 2019-03-25 ENCOUNTER — Ambulatory Visit (INDEPENDENT_AMBULATORY_CARE_PROVIDER_SITE_OTHER): Payer: Medicare Other | Admitting: Nurse Practitioner

## 2019-03-25 ENCOUNTER — Other Ambulatory Visit: Payer: Self-pay

## 2019-03-25 VITALS — BP 130/78 | HR 89 | Temp 98.1°F | Ht 61.0 in | Wt 149.8 lb

## 2019-03-25 DIAGNOSIS — E782 Mixed hyperlipidemia: Secondary | ICD-10-CM

## 2019-03-25 DIAGNOSIS — I5032 Chronic diastolic (congestive) heart failure: Secondary | ICD-10-CM

## 2019-03-25 DIAGNOSIS — J449 Chronic obstructive pulmonary disease, unspecified: Secondary | ICD-10-CM

## 2019-03-25 DIAGNOSIS — I10 Essential (primary) hypertension: Secondary | ICD-10-CM | POA: Diagnosis not present

## 2019-03-25 NOTE — Progress Notes (Signed)
Office Visit    Patient Name: Carolyn Steele Date of Encounter: 03/25/2019  Primary Care Provider:  Albina Billet, MD Primary Cardiologist:  Kathlyn Sacramento, MD  Chief Complaint    73 year old female with a history of HFpEF, hypertension, hyperlipidemia, COPD on home O2, GERD, hypothyroidism, and depression, who presents for follow-up related to history of hypertensive urgency and diastolic heart failure.  Past Medical History    Past Medical History:  Diagnosis Date  . (HFpEF) heart failure with preserved ejection fraction ([place])    a. 12/2018 Echo: EF 60-65%, pseudonormalization. Nl RV fxn. RVSP 51 mmHg. Mild MR/TR.  Marland Kitchen Acid reflux   . Arthritis   . CHF (congestive heart failure) ([place])   . COPD (chronic obstructive pulmonary disease) ([place])    a. Home O2 started 12/2018.  Marland Kitchen Demand ischemia ([place])    a. 12/2018 mild hsTrop elevation in setting of diast chf/htn urgency-->MV: no ischemia/infarct. EF 55-65%.  . Depression   . Hypercholesteremia   . Hypothyroidism   . Wears glasses    Past Surgical History:  Procedure Laterality Date  . CHOLECYSTECTOMY    . COLONOSCOPY    . FOOT SURGERY Right   . HAND SURGERY Bilateral   . SHOULDER SURGERY Left   . SPINAL CORD STIMULATOR INSERTION N/A 06/04/2015   Procedure: LUMBAR SPINAL CORD STIMULATOR INSERTION;  Surgeon: Clydell Hakim, MD;  Location: [place] [facility];  Service: Neurosurgery;  Laterality: N/A;    Allergies  Allergies  Allergen Reactions  . Celebrex [Celecoxib] Swelling  . Mobic [Meloxicam] Swelling  . Adhesive [Tape] Itching and Rash    History of Present Illness    73 year old female with the above past medical history including remote tobacco abuse, COPD, hypertension, hyperlipidemia, GERD, hypothyroidism, and depression.  In September, she was admitted to [facility] with progressive dyspnea on exertion and cough.  She was hypertensive on arrival and found to have cardiomegaly, small bilateral pleural effusions,  and vascular congestion.  She was admitted for heart failure.  Echo showed normal LV function with diastolic dysfunction.  She was aggressively diuresed.  She did have mild troponin elevation and stress testing was undertaken and showed no evidence of ischemia or infarct.  She was subsequently seen in clinic on September 24, at which time she was feeling well and blood pressure was stable.  She was hoping to come off of oxygen however, with ambulation, she dropped her oxygen saturation of 77%.  Follow-up lab work that day showed stable renal function on ARB therapy however, potassium was mildly elevated at 5.1.  Follow-up labs on October 8 showed normal potassium-4.3, and stable renal function.  She has since been seen in heart failure clinic on November 2 and was euvolemic.   Over the past few weeks, she has done well.  She continues to wear oxygen around-the-clock but notes that she sometimes takes it off at rest.  She does monitor her oxygen at home and notes that without oxygen, she will drop to 90% at rest.  While wearing oxygen, she has not had any significant issues with activity.  Her biggest limiting factor is chronic back pain which causes her to hold onto something while ambulating at all times.  She is bothered by needing oxygen as it has been a burden to carry around the tank.  She weighs herself daily and this is been stable to down slightly.  She denies chest pain, palpitations, PND, orthopnea, dizziness, syncope, edema, or early satiety.  Home  Medications    Prior to Admission medications   Medication Sig Start Date End Date Taking? Authorizing Provider  alendronate (FOSAMAX) 70 MG tablet Take 70 mg by mouth once a week. Take with a full glass of water on an empty stomach. Every Sunday    [provider]  aspirin 81 MG tablet Take 81 mg by mouth daily.    [provider]  Calcium Carb-Cholecalciferol (CALCIUM 600 + D PO) Take 1 tablet by mouth daily.    [provider]  Cranberry-Vitamin C 140-100 MG CAPS Take 2 capsules by mouth daily.     [provider]  Cyanocobalamin (VITAMIN B 12 PO) Take 1,000 mg by mouth daily.    [provider]  fexofenadine (ALLEGRA) 180 MG tablet Take 180 mg by mouth daily.    [provider]  furosemide (LASIX) 20 MG tablet Take 1 tablet (20 mg total) by mouth daily. 02/18/19   Theora Gianotti, NP  gabapentin (NEURONTIN) 300 MG capsule Take 300 mg by mouth daily.    [provider]  Glucosamine HCl 1500 MG TABS Take 1 tablet by mouth daily.    [provider]  guaifenesin (MUCUS RELIEF) 400 MG TABS tablet Take 400 mg by mouth daily.    [provider]  levothyroxine (SYNTHROID, LEVOTHROID) 150 MCG tablet Take 150 mcg by mouth daily before breakfast. Take 1 tablet M, T, Thurs, Fri and Sat. Take 1/2 tablet Wednesday and Skip Sunday.    [provider]  lidocaine (LIDODERM) 5 % Place 1 patch onto the skin every 12 (twelve) hours as needed (back pain). Remove & Discard patch within 12 hours or as directed by MD    [provider]  losartan (COZAAR) 25 MG tablet Take 1 tablet (25 mg total) by mouth daily. 02/19/19   Theora Gianotti, NP  Multiple Vitamins-Minerals (MULTIVITAMIN & MINERAL PO) Take 1 tablet by mouth daily.    [provider]  Omega-3 Fatty Acids (FISH OIL) 1000 MG CAPS Take 500 mg by mouth daily.     [provider]  omeprazole (PRILOSEC) 40 MG capsule Take 40 mg by mouth daily.    [provider]  OXYGEN Inhale 2 L into the lungs daily.    [provider]  pravastatin (PRAVACHOL) 40 MG tablet Take 40 mg by mouth daily.    [provider]  traMADol (ULTRAM) 50 MG tablet Take 50 mg by mouth every 6 (six) hours as needed for moderate pain.     [provider]    Review of Systems    Some degree of chronic dyspnea on exertion which is stable with oxygen therapy.  She  denies chest pain, palpitations, PND, orthopnea, dizziness, syncope, edema, or early satiety.  All other systems reviewed and are otherwise negative except as noted above.  Physical Exam    VS:  BP 130/78 (BP Location: Left Arm, Patient Position: Sitting, Cuff Size: Normal)   Pulse 89   Temp 98.1 F (36.7 C)   Ht _0  (1.549 m)   Wt 149 lb 12 oz (67.9 kg)   SpO2 98% Comment: on 2L O2  BMI 28.30 kg/m  , BMI Body mass index is 28.3 kg/m. GEN: Well nourished, well developed, in no acute distress. HEENT: normal. Neck: Supple, no JVD, carotid bruits, or masses. Cardiac: RRR, somewhat distant, no murmurs, rubs, or gallops. No clubbing, cyanosis, edema.  Radials/PT 2+ and equal bilaterally.  Respiratory:  Respirations regular and  unlabored, diminished breath sounds bilaterally. GI: Soft, nontender, nondistended, BS + x 4. MS: no deformity or atrophy. Skin: warm and dry, no rash. Neuro:  Strength and sensation are intact. Psych: Normal affect.  Accessory Clinical Findings    ECG personally reviewed by me today -regular sinus rhythm, 89, septal infarct - no acute changes.  Lab Results  Component Value Date   WBC 8.8 01/13/2019   HGB 11.8 (L) 01/13/2019   HCT 41.3 01/13/2019   MCV 79.4 (L) 01/13/2019   PLT 351 01/13/2019   Lab Results  Component Value Date   CREATININE 0.57 02/06/2019   BUN 12 02/06/2019   NA 137 02/06/2019   K 4.3 02/06/2019   CL 96 (L) 02/06/2019   CO2 34 (H) 02/06/2019   Lab Results  Component Value Date   ALT 246 (H) 01/11/2019   AST 260 (H) 01/11/2019   ALKPHOS 71 01/11/2019   BILITOT 0.8 01/11/2019   Lab Results  Component Value Date   CHOL 187 01/13/2019   HDL 69 01/13/2019   LDLCALC 101 (H) 01/13/2019   TRIG 84 01/13/2019   CHOLHDL 2.7 01/13/2019     Assessment & Plan    1.  Chronic diastolic congestive heart failure: Admission in September with dyspnea and volume overload in the setting of hypertensive urgency.  Echo at the time showed  normal LV function.  She had a mild troponin elevation however, stress testing was nonischemic.  She has done well over the past 2 months with stable weight and without significant dyspnea or edema.  She is euvolemic on examination today and her heart rate and blood pressure are well controlled.  She continues to require oxygen therapy and did desaturate to 85% with ambulation on room air, which is actually improvement over 2 months ago, when she desaturated to 77% on room air.  She remains on low-dose furosemide and losartan therapy.  2.  COPD: She remains on home O2.  As noted above, she desaturated to 85% with ambulation on room air.  Diminished breath sounds without active wheezing.  3.  Essential hypertension: Stable at 130/78.  She says she typically runs in the 1 teens to 120 at home.  4.  Hyperlipidemia: LDL of 101 in September.  She remains on Pravachol.  5.  Disposition: She has follow-up in heart failure clinic in early February.  We will plan to see her back here in April or sooner if necessary.   Murray Hodgkins, NP 03/25/2019, 11:55 AM

## 2019-03-25 NOTE — Patient Instructions (Signed)
Medication Instructions:  Your physician recommends that you continue on your current medications as directed. Please refer to the Current Medication list given to you today.  *If you need a refill on your cardiac medications before your next appointment, please call your pharmacy*  Lab Work: None ordered  If you have labs (blood work) drawn today and your tests are completely normal, you will receive your results only by: Marland Kitchen MyChart Message (if you have MyChart) OR . A paper copy in the mail If you have any lab test that is abnormal or we need to change your treatment, we will call you to review the results.  Testing/Procedures: None ordered   Follow-Up: At [facility], you and your health needs are our priority.  As part of our continuing mission to provide you with exceptional heart care, we have created designated Provider Care Teams.  These Care Teams include your primary Cardiologist (physician) and Advanced Practice Providers (APPs -  Physician Assistants and Nurse Practitioners) who all work together to provide you with the care you need, when you need it.  Your next appointment:   4 month(s)  The format for your next appointment:   In Person  Provider:    You may see Kathlyn Sacramento, MD or Murray Hodgkins, NP.

## 2019-04-16 ENCOUNTER — Telehealth: Payer: Self-pay

## 2019-04-16 MED ORDER — FUROSEMIDE 20 MG PO TABS: 20.0000 mg | ORAL_TABLET | Freq: Every day | ORAL | 0 refills | Status: DC

## 2019-04-16 NOTE — Telephone Encounter (Signed)
Requested Prescriptions   Signed Prescriptions Disp Refills  . furosemide (LASIX) 20 MG tablet 90 tablet 0    Sig: Take 1 tablet (20 mg total) by mouth daily.    Authorizing Provider: Theora Gianotti    Ordering User: Raelene Bott, Tawn Fitzner L

## 2019-06-02 NOTE — Progress Notes (Signed)
Patient ID: Carolyn Steele, female    DOB: Jun 03, 1945, 74 y.o.   MRN: [id number]  Carolyn Steele is a 74 y/o female with a history of hyperlipidemia, thyroid disease, depression, previous tobacco use and chronic heart failure.   Echo report from 01/11/2019 reviewed and showed an EF of 60-65% and an elevated PA pressure of 51 mmHg.   Admitted 01/10/2019 due to acute heart failure. Cardiology consult obtained. Initially given IV lasix and then transitioned to oral diuretics. Diuresed >6L. REDS reading was 32%. Elevated troponin thought to be due to demand ischemia. Discharged after 3 days.   She presents today for her follow-up visit with a chief complaint of minimal shortness of breath upon moderate exertion. She describes this as chronic in nature having been present for several years. She has associated cough, fatigue and rhinorrhea along with this. She denies any difficulty sleeping, dizziness, abdominal distention, palpitations, pedal edema, chest pain or weight gain.  Wears her oxygen at 2L around the clock.   Past Medical History:  Diagnosis Date  . (HFpEF) heart failure with preserved ejection fraction ([place])    a. 12/2018 Echo: EF 60-65%, pseudonormalization. Nl RV fxn. RVSP 51 mmHg. Mild MR/TR.  Marland Kitchen Acid reflux   . Arthritis   . CHF (congestive heart failure) ([place])   . COPD (chronic obstructive pulmonary disease) ([place])    a. Home O2 started 12/2018.  Marland Kitchen Demand ischemia ([place])    a. 12/2018 mild hsTrop elevation in setting of diast chf/htn urgency-->MV: no ischemia/infarct. EF 55-65%.  . Depression   . Hypercholesteremia   . Hypothyroidism   . Wears glasses    Past Surgical History:  Procedure Laterality Date  . CHOLECYSTECTOMY    . COLONOSCOPY    . FOOT SURGERY Right   . HAND SURGERY Bilateral   . SHOULDER SURGERY Left   . SPINAL CORD STIMULATOR INSERTION N/A 06/04/2015   Procedure: LUMBAR SPINAL CORD STIMULATOR INSERTION;  Surgeon: Clydell Hakim, MD;  Location: [place] [facility];   Service: Neurosurgery;  Laterality: N/A;   Family History  Problem Relation Age of Onset  . Lung cancer Mother   . Heart failure Father   . Breast cancer Neg Hx    Social History   Tobacco Use  . Smoking status: Former Smoker    Packs/day: 1.00    Years: 40.00    Pack years: 40.00    Types: Cigarettes  . Smokeless tobacco: Never Used  . Tobacco comment: quit smoking approximately 8-10 years ago (05/28/2015)  Substance Use Topics  . Alcohol use: No   Allergies  Allergen Reactions  . Celebrex [Celecoxib] Swelling  . Mobic [Meloxicam] Swelling  . Adhesive [Tape] Itching and Rash   Prior to Admission medications   Medication Sig Start Date End Date Taking? Authorizing Provider  alendronate (FOSAMAX) 70 MG tablet Take 70 mg by mouth once a week. Take with a full glass of water on an empty stomach. Every Sunday   Yes [provider]  aspirin 81 MG tablet Take 81 mg by mouth daily.   Yes [provider]  Calcium Carb-Cholecalciferol (CALCIUM 600 + D PO) Take 1 tablet by mouth daily.   Yes [provider]  Cranberry-Vitamin C 140-100 MG CAPS Take 2 capsules by mouth daily.    Yes [provider]  Cyanocobalamin (VITAMIN B 12 PO) Take 1,000 mg by mouth daily.   Yes [provider]  fexofenadine (ALLEGRA) 180 MG tablet Take 180 mg by mouth daily.  Yes [provider]  furosemide (LASIX) 20 MG tablet Take 1 tablet (20 mg total) by mouth daily. 04/16/19  Yes Theora Gianotti, NP  gabapentin (NEURONTIN) 300 MG capsule Take 300 mg by mouth daily.   Yes [provider]  Glucosamine HCl 1500 MG TABS Take 1 tablet by mouth daily.   Yes [provider]  guaifenesin (MUCUS RELIEF) 400 MG TABS tablet Take 400 mg by mouth daily.   Yes [provider]  levothyroxine (SYNTHROID, LEVOTHROID) 150 MCG tablet Take 150 mcg by mouth daily before breakfast. Take 1 tablet M, T, Thurs, Fri and Sat. Take 1/2 tablet  Wednesday and Skip Sunday.   Yes [provider]  lidocaine (LIDODERM) 5 % Place 1 patch onto the skin every 12 (twelve) hours as needed (back pain). Remove & Discard patch within 12 hours or as directed by MD   Yes [provider]  losartan (COZAAR) 25 MG tablet Take 1 tablet (25 mg total) by mouth daily. 02/19/19  Yes Theora Gianotti, NP  Multiple Vitamins-Minerals (MULTIVITAMIN & MINERAL PO) Take 1 tablet by mouth daily.   Yes [provider]  Omega-3 Fatty Acids (FISH OIL) 1000 MG CAPS Take 500 mg by mouth daily.    Yes [provider]  omeprazole (PRILOSEC) 40 MG capsule Take 40 mg by mouth daily.   Yes [provider]  OXYGEN Inhale 2 L into the lungs daily.   Yes [provider]  pravastatin (PRAVACHOL) 40 MG tablet Take 40 mg by mouth daily.   Yes [provider]  traMADol (ULTRAM) 50 MG tablet Take 50 mg by mouth every 6 (six) hours as needed for moderate pain.    Yes [provider]    Review of Systems  Constitutional: Positive for fatigue (minimal). Negative for appetite change.  HENT: Positive for hearing loss and rhinorrhea. Negative for congestion and sore throat.   Eyes: Negative.   Respiratory: Positive for cough and shortness of breath (minimal).   Cardiovascular: Negative for chest pain, palpitations and leg swelling.  Gastrointestinal: Negative for abdominal distention and abdominal pain.  Endocrine: Negative.   Genitourinary: Negative.   Musculoskeletal: Negative for back pain and neck pain.  Skin: Negative.   Allergic/Immunologic: Negative.   Neurological: Negative for dizziness and light-headedness.  Hematological: Negative for adenopathy. Does not bruise/bleed easily.  Psychiatric/Behavioral: Negative for dysphoric mood and sleep disturbance (sleeping on 2 pillows; wearing oxygen at 2L around the clock). The patient is not nervous/anxious.    Vitals:   06/03/19 0937  BP: (!) 148/66   Pulse: 86  Resp: 16  SpO2: 100%  Weight: 138 lb 12.8 oz (63 kg)  Height: _0  (1.549 m)  PF: (!) 2 L/min   Wt Readings from Last 3 Encounters:  06/03/19 138 lb 12.8 oz (63 kg)  03/25/19 149 lb 12 oz (67.9 kg)  03/03/19 154 lb (69.9 kg)   Lab Results  Component Value Date   CREATININE 0.57 02/06/2019   CREATININE 0.50 (L) 01/23/2019   CREATININE 0.49 01/12/2019    Physical Exam Vitals and nursing note reviewed.  Constitutional:      Appearance: Normal appearance.  HENT:     Head: Normocephalic and atraumatic.     Right Ear: Decreased hearing noted.     Left Ear: Decreased hearing noted.  Cardiovascular:     Rate and Rhythm: Normal rate and regular rhythm.  Pulmonary:     Effort: Pulmonary effort is normal. No respiratory distress.  Breath sounds: No wheezing, rhonchi or rales.  Abdominal:     Palpations: Abdomen is soft.     Tenderness: There is no abdominal tenderness.  Musculoskeletal:        General: No tenderness.     Cervical back: Normal range of motion and neck supple.     Right lower leg: No edema.     Left lower leg: No edema.  Skin:    General: Skin is warm and dry.  Neurological:     General: No focal deficit present.     Mental Status: She is alert and oriented to person, place, and time.  Psychiatric:        Mood and Affect: Mood normal.        Behavior: Behavior normal.        Thought Content: Thought content normal.     Assessment & Plan:  1: Chronic heart failure with preserved ejection fraction- - NYHA class II - euvolemic today - weighing daily; reminded to call for an overnight weight gain of >2 pounds or a weekly weight gain of >5 pounds - weight down 16 pounds from last visit here 3 months ago - not adding salt and is using pepper; reading food labels for sodium content so that she can keep sodium level <2050m / day - reports being active at home around the house - wearing oxygen at 2L around the clock - saw cardiology (Sharolyn Douglas  03/25/2019 - BNP 01/10/2019 was 531.0   2: Hypertension- - BP mildly elevated but she did [place] - BMP from 02/06/2019 reviewed and showed sodium 137, potassium 4.3, creatinine 0.57 and GFR >60 - saw PCP (Hall Busing last week - taking levothyroxine  Medication list reviewed.   Due to stable HF, will not make a return appointment for patient at this time. Advised patient that she could call back at anytime to schedule another appointment and she was comfortable with this plan.

## 2019-06-03 ENCOUNTER — Ambulatory Visit: Payer: Medicare Other | Attending: Family | Admitting: Family

## 2019-06-03 ENCOUNTER — Other Ambulatory Visit: Payer: Self-pay

## 2019-06-03 ENCOUNTER — Encounter: Payer: Self-pay | Admitting: Family

## 2019-06-03 VITALS — BP 148/66 | HR 86 | Resp 16 | Ht 61.0 in | Wt 138.8 lb

## 2019-06-03 DIAGNOSIS — E039 Hypothyroidism, unspecified: Secondary | ICD-10-CM | POA: Diagnosis not present

## 2019-06-03 DIAGNOSIS — E7800 Pure hypercholesterolemia, unspecified: Secondary | ICD-10-CM | POA: Insufficient documentation

## 2019-06-03 DIAGNOSIS — I110 Hypertensive heart disease with heart failure: Secondary | ICD-10-CM | POA: Diagnosis not present

## 2019-06-03 DIAGNOSIS — F329 Major depressive disorder, single episode, unspecified: Secondary | ICD-10-CM | POA: Diagnosis not present

## 2019-06-03 DIAGNOSIS — Z9981 Dependence on supplemental oxygen: Secondary | ICD-10-CM | POA: Insufficient documentation

## 2019-06-03 DIAGNOSIS — K219 Gastro-esophageal reflux disease without esophagitis: Secondary | ICD-10-CM | POA: Insufficient documentation

## 2019-06-03 DIAGNOSIS — Z87891 Personal history of nicotine dependence: Secondary | ICD-10-CM | POA: Diagnosis not present

## 2019-06-03 DIAGNOSIS — Z79899 Other long term (current) drug therapy: Secondary | ICD-10-CM | POA: Diagnosis not present

## 2019-06-03 DIAGNOSIS — I10 Essential (primary) hypertension: Secondary | ICD-10-CM

## 2019-06-03 DIAGNOSIS — J449 Chronic obstructive pulmonary disease, unspecified: Secondary | ICD-10-CM | POA: Insufficient documentation

## 2019-06-03 DIAGNOSIS — Z9682 Presence of neurostimulator: Secondary | ICD-10-CM | POA: Insufficient documentation

## 2019-06-03 DIAGNOSIS — Z886 Allergy status to analgesic agent status: Secondary | ICD-10-CM | POA: Insufficient documentation

## 2019-06-03 DIAGNOSIS — Z7982 Long term (current) use of aspirin: Secondary | ICD-10-CM | POA: Insufficient documentation

## 2019-06-03 DIAGNOSIS — Z79890 Hormone replacement therapy (postmenopausal): Secondary | ICD-10-CM | POA: Insufficient documentation

## 2019-06-03 DIAGNOSIS — I5032 Chronic diastolic (congestive) heart failure: Secondary | ICD-10-CM | POA: Insufficient documentation

## 2019-06-03 DIAGNOSIS — M1990 Unspecified osteoarthritis, unspecified site: Secondary | ICD-10-CM | POA: Insufficient documentation

## 2019-06-03 DIAGNOSIS — I248 Other forms of acute ischemic heart disease: Secondary | ICD-10-CM | POA: Insufficient documentation

## 2019-06-03 DIAGNOSIS — Z888 Allergy status to other drugs, medicaments and biological substances status: Secondary | ICD-10-CM | POA: Insufficient documentation

## 2019-06-03 NOTE — Patient Instructions (Addendum)
Continue weighing daily and call for an overnight weight gain of > 2 pounds or a weekly weight gain of >5 pounds.  Call us if you'd like to schedule another appointment in the future.

## 2019-07-07 ENCOUNTER — Telehealth: Payer: Self-pay

## 2019-07-07 MED ORDER — FUROSEMIDE 20 MG PO TABS: 20.0000 mg | ORAL_TABLET | Freq: Every day | ORAL | 0 refills | Status: DC

## 2019-07-07 NOTE — Telephone Encounter (Signed)
Requested Prescriptions   Signed Prescriptions Disp Refills  . furosemide (LASIX) 20 MG tablet 90 tablet 0    Sig: Take 1 tablet (20 mg total) by mouth daily.    Authorizing Provider: Theora Gianotti    Ordering User: Janan Ridge

## 2019-07-24 ENCOUNTER — Encounter: Payer: Self-pay | Admitting: Cardiovascular Disease

## 2019-07-24 ENCOUNTER — Ambulatory Visit (INDEPENDENT_AMBULATORY_CARE_PROVIDER_SITE_OTHER): Payer: Medicare Other | Admitting: Cardiovascular Disease

## 2019-07-24 ENCOUNTER — Other Ambulatory Visit: Payer: Self-pay

## 2019-07-24 VITALS — BP 120/62 | HR 79 | Ht 61.0 in | Wt 138.4 lb

## 2019-07-24 DIAGNOSIS — I10 Essential (primary) hypertension: Secondary | ICD-10-CM | POA: Diagnosis not present

## 2019-07-24 DIAGNOSIS — M79605 Pain in left leg: Secondary | ICD-10-CM

## 2019-07-24 DIAGNOSIS — M79604 Pain in right leg: Secondary | ICD-10-CM

## 2019-07-24 DIAGNOSIS — I5032 Chronic diastolic (congestive) heart failure: Secondary | ICD-10-CM

## 2019-07-24 DIAGNOSIS — E785 Hyperlipidemia, unspecified: Secondary | ICD-10-CM | POA: Diagnosis not present

## 2019-07-24 MED ORDER — FUROSEMIDE 20 MG PO TABS: 20.0000 mg | ORAL_TABLET | Freq: Every day | ORAL | 2 refills | Status: AC

## 2019-07-24 NOTE — Progress Notes (Signed)
Cardiology Office Note   Date:  07/24/2019   ID:  Carolyn Steele, DOB Apr 28, 1946, MRN [id number]  PCP:  Albina Billet, MD  Cardiologist:   Kathlyn Sacramento, MD   Chief Complaint  Patient presents with  . other    4 month f/u no complaints today. Meds reviewed verbally with pt.      History of Present Illness: Carolyn Steele is a 74 y.o. female who presents for a follow-up visit regarding chronic diastolic heart failure. She has chronic medical conditions including essential hypertension, hyperlipidemia, COPD on home oxygen, GERD, hypothyroidism and depression. She was admitted to [facility] in September with heart failure in the setting of uncontrolled hypertension.  Echo showed normal LV systolic function with evidence of diastolic dysfunction.  Troponin was mildly elevated thought to be due to supply demand ischemia.  Stress test showed no evidence of ischemia or infarct. She has chronic low back pain. She was most recently seen in our clinic by Ignacia Bayley in November. She is frustrated that she continues to be on home oxygen and she does not know the reason.  She was placed on oxygen during her hospitalization in September.  She has not been evaluated by pulmonary.  She has history of tobacco use but reports quitting in 2010.  She complains of significant low back pain after a previous injury.  The pain radiates to her legs worse on the left side.   Past Medical History:  Diagnosis Date  . (HFpEF) heart failure with preserved ejection fraction ([place])    a. 12/2018 Echo: EF 60-65%, pseudonormalization. Nl RV fxn. RVSP 51 mmHg. Mild MR/TR.  Marland Kitchen Acid reflux   . Arthritis   . CHF (congestive heart failure) ([place])   . COPD (chronic obstructive pulmonary disease) ([place])    a. Home O2 started 12/2018.  Marland Kitchen Demand ischemia ([place])    a. 12/2018 mild hsTrop elevation in setting of diast chf/htn urgency-->MV: no ischemia/infarct. EF 55-65%.  . Depression   . Hypercholesteremia   . Hypothyroidism    . Wears glasses     Past Surgical History:  Procedure Laterality Date  . CHOLECYSTECTOMY    . COLONOSCOPY    . FOOT SURGERY Right   . HAND SURGERY Bilateral   . SHOULDER SURGERY Left   . SPINAL CORD STIMULATOR INSERTION N/A 06/04/2015   Procedure: LUMBAR SPINAL CORD STIMULATOR INSERTION;  Surgeon: Clydell Hakim, MD;  Location: [place] [facility];  Service: Neurosurgery;  Laterality: N/A;     Current Outpatient Medications  Medication Sig Dispense Refill  . alendronate (FOSAMAX) 70 MG tablet Take 70 mg by mouth once a week. Take with a full glass of water on an empty stomach. Every Sunday    . aspirin 81 MG tablet Take 81 mg by mouth daily.    . Calcium Carb-Cholecalciferol (CALCIUM 600 + D PO) Take 1 tablet by mouth daily.    . Cranberry-Vitamin C 140-100 MG CAPS Take 2 capsules by mouth daily.     . Cyanocobalamin (VITAMIN B 12 PO) Take 1,000 mg by mouth daily.    . fexofenadine (ALLEGRA) 180 MG tablet Take 180 mg by mouth daily.    . furosemide (LASIX) 20 MG tablet Take 1 tablet (20 mg total) by mouth daily. 90 tablet 0  . gabapentin (NEURONTIN) 300 MG capsule Take 300 mg by mouth daily.    . Glucosamine HCl 1500 MG TABS Take 1 tablet by mouth daily.    Marland Kitchen guaifenesin (MUCUS RELIEF)  400 MG TABS tablet Take 400 mg by mouth daily.    Marland Kitchen levothyroxine (SYNTHROID, LEVOTHROID) 150 MCG tablet Take 150 mcg by mouth daily before breakfast. Take 1 tablet M, T, Thurs, Fri and Sat. Take 1/2 tablet Wednesday and Skip Sunday.    . lidocaine (LIDODERM) 5 % Place 1 patch onto the skin every 12 (twelve) hours as needed (back pain). Remove & Discard patch within 12 hours or as directed by MD    . losartan (COZAAR) 25 MG tablet Take 1 tablet (25 mg total) by mouth daily. 90 tablet 3  . Multiple Vitamins-Minerals (MULTIVITAMIN & MINERAL PO) Take 1 tablet by mouth daily.    . Multiple Vitamins-Minerals (PRESERVISION AREDS 2) CAPS in the morning and at bedtime.    . Omega-3 Fatty Acids (FISH OIL) 1000 MG CAPS  Take 500 mg by mouth daily.     Marland Kitchen omeprazole (PRILOSEC) 40 MG capsule Take 40 mg by mouth daily.    . OXYGEN Inhale 2 L into the lungs daily.    . pravastatin (PRAVACHOL) 40 MG tablet Take 40 mg by mouth daily.    . traMADol (ULTRAM) 50 MG tablet Take 50 mg by mouth every 6 (six) hours as needed for moderate pain.      No current facility-administered medications for this visit.    Allergies:   Celebrex [celecoxib], Mobic [meloxicam], and Adhesive [tape]    Social History:  The patient  reports that she has quit smoking. Her smoking use included cigarettes. She has a 40.00 pack-year smoking history. She has never used smokeless tobacco. She reports that she does not drink alcohol or use drugs.   Family History:  The patient's family history includes Heart failure in her father; Lung cancer in her mother.    ROS:  Please see the history of present illness.   Otherwise, review of systems are positive for none.   All other systems are reviewed and negative.    PHYSICAL EXAM: VS:  BP 120/62 (BP Location: Left Arm, Patient Position: Sitting, Cuff Size: Normal)   Pulse 79   Ht _0  (1.549 m)   Wt 138 lb 6 oz (62.8 kg)   SpO2 95%   BMI 26.15 kg/m  , BMI Body mass index is 26.15 kg/m. GEN: Well nourished, well developed, in no acute distress  HEENT: normal  Neck: no JVD, carotid bruits, or masses Cardiac: RRR; no murmurs, rubs, or gallops,no edema  Respiratory: Diminished breath sounds bilaterally GI: soft, nontender, nondistended, + BS MS: no deformity or atrophy  Skin: warm and dry, no rash Neuro:  Strength and sensation are intact Psych: euthymic mood, full affect Vascular: Femoral pulses mildly diminished on the right side.  Distal pulses are diminished.   EKG:  EKG is ordered today. The ekg ordered today demonstrates normal sinus rhythm with low voltage   Recent Labs: 01/10/2019: B Natriuretic Peptide 531.0 01/11/2019: ALT 246 01/13/2019: Hemoglobin 11.8; Platelets  351 02/06/2019: BUN 12; Creatinine, Ser 0.57; Potassium 4.3; Sodium 137    Lipid Panel    Component Value Date/Time   CHOL 187 01/13/2019 1238   TRIG 84 01/13/2019 1238   HDL 69 01/13/2019 1238   CHOLHDL 2.7 01/13/2019 1238   VLDL 17 01/13/2019 1238   LDLCALC 101 (H) 01/13/2019 1238      Wt Readings from Last 3 Encounters:  07/24/19 138 lb 6 oz (62.8 kg)  06/03/19 138 lb 12.8 oz (63 kg)  03/25/19 149 lb 12 oz (67.9 kg)  No flowsheet data found.    ASSESSMENT AND PLAN:  1.  Chronic diastolic heart failure: She appears to be euvolemic on small dose furosemide.  Blood pressure is under excellent control.  Continue same medications  2.  COPD: Currently requiring oxygen.  The patient wants to come off oxygen.  I am going to refer her for pulmonary evaluation with pulmonary function testing and 5-minute walk to see if she can come off oxygen.  The patient has not smoked since 2010 so it is somewhat unusual for her to develop hypoxia requiring oxygen.  Her hypoxia seems to correct with oxygen and thus doubt intracardiac shunting.  3.  Essential hypertension: Blood pressure is controlled  4.  Hyperlipidemia: Currently on Pravachol.  5.  Low back pain: Prolonged history of tobacco use and abnormal lower extremity pulses.  I requested lower extremity arterial Doppler.   Disposition:   FU with me in 6 months  Signed,  Kathlyn Sacramento, MD  07/24/2019 8:26 AM    [place]

## 2019-07-24 NOTE — Patient Instructions (Signed)
Medication Instructions:  Your physician recommends that you continue on your current medications as directed. Please refer to the Current Medication list given to you today.  *If you need a refill on your cardiac medications before your next appointment, please call your pharmacy*   Lab Work: None ordered If you have labs (blood work) drawn today and your tests are completely normal, you will receive your results only by: Marland Kitchen MyChart Message (if you have MyChart) OR . A paper copy in the mail If you have any lab test that is abnormal or we need to change your treatment, we will call you to review the results.   Testing/Procedures: Your physician has requested that you have a lower extremity arterial exercise duplex. During this test, exercise and ultrasound are used to evaluate arterial blood flow in the legs. Allow one hour for this exam. There are no restrictions or special instructions.    Follow-Up: At [facility], you and your health needs are our priority.  As part of our continuing mission to provide you with exceptional heart care, we have created designated Provider Care Teams.  These Care Teams include your primary Cardiologist (physician) and Advanced Practice Providers (APPs -  Physician Assistants and Nurse Practitioners) who all work together to provide you with the care you need, when you need it.  We recommend signing up for the patient portal called "MyChart".  Sign up information is provided on this After Visit Summary.  MyChart is used to connect with patients for Virtual Visits (Telemedicine).  Patients are able to view lab/test results, encounter notes, upcoming appointments, etc.  Non-urgent messages can be sent to your provider as well.   To learn more about what you can do with MyChart, go to [email or web address].    Your next appointment:   6 month(s)   You have been referred to [facility]. Their office will contact you directly to schedule the  appointment.  The format for your next appointment:   In Person  Provider:    You may see Kathlyn Sacramento, MD or one of the following Advanced Practice Providers on your designated Care Team:    Murray Hodgkins, NP  Christell Faith, PA-C  Marrianne Mood, PA-C    Other Instructions N/A

## 2019-08-21 ENCOUNTER — Other Ambulatory Visit: Payer: Self-pay

## 2019-08-21 ENCOUNTER — Ambulatory Visit: Payer: Medicare Other | Admitting: Pulmonary Disease

## 2019-08-21 ENCOUNTER — Encounter: Payer: Self-pay | Admitting: Pulmonary Disease

## 2019-08-21 VITALS — BP 130/64 | HR 72 | Temp 97.1°F | Ht 60.0 in | Wt 135.8 lb

## 2019-08-21 DIAGNOSIS — J9611 Chronic respiratory failure with hypoxia: Secondary | ICD-10-CM | POA: Diagnosis not present

## 2019-08-21 DIAGNOSIS — I5032 Chronic diastolic (congestive) heart failure: Secondary | ICD-10-CM | POA: Diagnosis not present

## 2019-08-21 DIAGNOSIS — R0602 Shortness of breath: Secondary | ICD-10-CM | POA: Diagnosis not present

## 2019-08-21 DIAGNOSIS — I2722 Pulmonary hypertension due to left heart disease: Secondary | ICD-10-CM

## 2019-08-21 NOTE — Patient Instructions (Signed)
You did well on your walking test today, you do not need oxygen during the daytime  It appears that your oxygen was given to you after you had an episode of congestive heart failure and your lungs had fluid in them  You will need an evaluation of oxygen at nighttime to determine if you need oxygen during the night  The artery going from your heart to the lungs is under higher pressure than usual for most people, we will check an echocardiogram to see if this remains elevated if it is, you may need a right heart cath to evaluate this issue further  We will obtain breathing tests  We'll see you in follow-up in 2 months time

## 2019-08-21 NOTE — Progress Notes (Signed)
Subjective:    Patient ID: Carolyn Steele, female    DOB: 06-22-1945, 74 y.o.   MRN: [id number] Requesting MD/Service: Kathlyn Sacramento MD: Primary MD: Benita Stabile MD Date of initial consultation: 21 August 2019 Steele for consultation: Shortness of breath   PT PROFILE: 74 year old former smoker (quit 2010, 40-pack-year history) with shortness of breath and known history of pulmonary hypertension and diastolic dysfunction.  Evaluated for shortness of breath due to severe COPD.   DATA: 01/10/2019 CT angio chest: No PE, bilateral pleural effusions, small, right lung compressive atelectasis, cardiomegaly, centrilobular emphysema.  There is also moderate hiatal hernia 01/11/2019 2D echo: LVEF 60 to 56%, diastolic dysfunction noted, right ventricular systolic pressure elevated at 51 mmHg  HPI This 74 year old former smoker (quit 2010) presents for evaluation of shortness of breath and oxygen dependency.  She is kindly referred by Dr. Kathlyn Sacramento, her primary physician is Dr. Benita Stabile. The patient has been on 2 L of oxygen via nasal cannula after a September 2020 admission at [facility] for heart failure in the setting of uncontrolled hypertension.  She has known diastolic dysfunction and pulmonary hypertension as noted by prior 2D echo.  She has difficulties with dyspnea and states that oxygen makes her dyspnea better.  She is currently not on any respiratory medications.  She does not have any cough or sputum production.  Does describe lower extremity edema.  No orthopnea or paroxysmal nocturnal dyspnea.  No chest pain.  She does not endorse any other symptomatology.  She was actually unsure why she was seeing [place] today and felt that this was due to her congestive heart failure.  Review of Systems A 10 point review of systems was performed and it is as noted above otherwise negative.  PMHx:  has a past medical history of (HFpEF) heart failure with preserved ejection fraction (HCC), Acid reflux,  Arthritis, CHF (congestive heart failure) ([place]), COPD (chronic obstructive pulmonary disease) ([place]), Demand ischemia ([place]), Depression, Hypercholesteremia, Hypothyroidism, and Wears glasses.   Past Surgical History:  Procedure Laterality Date  . CHOLECYSTECTOMY    . COLONOSCOPY    . FOOT SURGERY Right   . HAND SURGERY Bilateral   . SHOULDER SURGERY Left   . SPINAL CORD STIMULATOR INSERTION N/A 06/04/2015   Procedure: LUMBAR SPINAL CORD STIMULATOR INSERTION;  Surgeon: Clydell Hakim, MD;  Location: [place] [facility];  Service: Neurosurgery;  Laterality: N/A;   Fam Hx: family history includes Heart failure in her father; Lung cancer in her mother.  Allergies as of 08/21/2019 - Review Complete 08/21/2019  Allergen Reaction Noted  . Celebrex [celecoxib] Swelling 01/16/2014  . Mobic [meloxicam] Swelling 01/16/2014  . Adhesive [tape] Itching and Rash 01/16/2014   Social History   Tobacco Use  . Smoking status: Former Smoker    Packs/day: 1.00    Years: 40.00    Pack years: 40.00    Types: Cigarettes    Quit date: 2010    Years since quitting: 12.3  . Smokeless tobacco: Never Used  . Tobacco comment: quit smoking approximately 10-12 years ago (05/28/2015)  Substance Use Topics  . Alcohol use: No   Immunization History  Administered Date(s) Administered  . Fluad Quad(high Dose 65+) 01/30/2019  . Influenza-Unspecified 02/16/2020  . Zoster Recombinat (Shingrix) 06/17/2018, 11/20/2018   Current Meds  Medication Sig  . alendronate (FOSAMAX) 70 MG tablet Take 70 mg by mouth once a week. Take with a full glass of water on an empty stomach. Every Sunday  . aspirin 81  MG tablet Take 81 mg by mouth daily.  . Calcium Carb-Cholecalciferol (CALCIUM 600 + D PO) Take 1 tablet by mouth daily.  . Cranberry-Vitamin C 140-100 MG CAPS Take 2 capsules by mouth daily.   . Cyanocobalamin (VITAMIN B 12 PO) Take 1,000 mg by mouth daily.  . fexofenadine (ALLEGRA) 180 MG tablet Take 180 mg by mouth daily.  .  furosemide (LASIX) 20 MG tablet Take 1 tablet (20 mg total) by mouth daily.  Marland Kitchen gabapentin (NEURONTIN) 300 MG capsule Take 300 mg by mouth daily.  . Glucosamine HCl 1500 MG TABS Take 1 tablet by mouth daily.  Marland Kitchen guaifenesin (HUMIBID E) 400 MG TABS tablet Take 400 mg by mouth daily.  Marland Kitchen levothyroxine (SYNTHROID, LEVOTHROID) 150 MCG tablet Take 150 mcg by mouth daily before breakfast. Take 1 tablet M, T, Thurs, Fri and Sat. Take 1/2 tablet Wednesday and Skip Sunday.  . Multiple Vitamins-Minerals (MULTIVITAMIN & MINERAL PO) Take 1 tablet by mouth daily.  . Multiple Vitamins-Minerals (PRESERVISION AREDS 2) CAPS in the morning and at bedtime.  . Omega-3 Fatty Acids (FISH OIL) 1000 MG CAPS Take 500 mg by mouth daily.   Marland Kitchen omeprazole (PRILOSEC) 40 MG capsule Take 40 mg by mouth daily.  . OXYGEN Inhale 2 L into the lungs daily.  . traMADol (ULTRAM) 50 MG tablet Take 50 mg by mouth every 6 (six) hours as needed for moderate pain.   . [DISCONTINUED] losartan (COZAAR) 25 MG tablet Take 1 tablet (25 mg total) by mouth daily.  . [DISCONTINUED] pravastatin (PRAVACHOL) 40 MG tablet Take 40 mg by mouth daily.      Objective:   Physical Exam BP 130/64 (BP Location: Left Arm, Cuff Size: Normal)   Pulse 72   Temp (!) 97.1 F (36.2 C) (Oral)   Ht 5' (1.524 m)   Wt 135 lb 12.8 oz (61.6 kg)   SpO2 99%   BMI 26.52 kg/m  GENERAL: Frail-appearing woman, no acute distress, fully ambulatory.  Assist cane for assistance. HEAD: Normocephalic, atraumatic.  EYES: Pupils equal, round, reactive to light.  No scleral icterus.  MOUTH: Nose/mouth/throat not examined due to masking requirements for COVID 19. NECK: Supple. No thyromegaly. Trachea midline. No JVD.  No adenopathy. PULMONARY: Increased AP diameter.  Good air entry bilaterally.  Coarse breath sounds otherwise no adventitious sounds. CARDIOVASCULAR: S1 and S2. Regular rate and rhythm.  ABDOMEN: Benign. MUSCULOSKELETAL: Significant kyphosis.  No joint deformity,  no clubbing, no edema.  NEUROLOGIC: No focal deficit, speech is fluent, gait without disturbance though does use cane for steadying. SKIN: Intact,warm,dry. PSYCH: Mood and behavior are normal.  Ambulatory symmetry performed today: On room air, baseline O2 on room air was 99% patient was able to ambulate over 750 feet, did not complain of dyspnea during the ambulation.  Oxygen nadir 93%.     Assessment & Plan:     ICD-10-CM   1. 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 Chronic diastolic heart failure (HCC)  I50.32 ECHOCARDIOGRAM COMPLETE    Pulse oximetry, overnight   Reassess with 2D echo  3. Chronic respiratory failure with hypoxia (HCC)  J96.11 Pulmonary Function Test [facility] Only    Pulse oximetry, overnight   Currently no need for O2 during ambulation Check nocturnal oxygen saturation on room air Suspect will need nocturnal O2  4. Shortness of breath  R06.02 Pulmonary Function Test [facility] Only    Pulse oximetry, overnight   Multifactorial Possible COPD Diastolic dysfunction Obtain PFTs Pulmonary hypertension   Orders Placed This Encounter  Procedures  . Pulmonary Function Test [facility] Only    Standing Status:   Future    Number of Occurrences:   1    Standing Expiration Date:   08/20/2020    Order Specific Question:   Full PFT: includes the following: basic spirometry, spirometry pre & post bronchodilator, diffusion capacity (DLCO), lung volumes    Answer:   Full PFT    Order Specific Question:   This test can only be performed at    Answer:   [facility]  . Pulse oximetry, overnight    On room air    Standing Status:   Future    Standing Expiration Date:   08/20/2020  . ECHOCARDIOGRAM COMPLETE    Standing Status:   Future    Number of Occurrences:   1    Standing Expiration Date:   11/19/2020    Order Specific Question:   Where  should this test be performed    Answer:   [facility]    Order Specific Question:   Please indicate who you request to read the nuc med / echo results.    Answer:   [facility]    Order Specific Question:   Perflutren DEFINITY (image enhancing agent) should be administered unless hypersensitivity or allergy exist    Answer:   Administer Perflutren    Order Specific Question:   Steele for exam-Echo    Answer:   Dyspnea  786.09 / R06.00   During ambulatory oximetry today on room air not requiring any oxygen for supplementation.  She needs to have however nocturnal oxygen requirements assessed.  Given that she has pulmonary hypertension suspect that she will need supplemental oxygen at nighttime.  Her oxygen was provided during an episode of congestive heart failure and this explains why she does not required it currently.  She had pulmonary hypertension explained to her in layman's terms today.  We will proceed with obtaining pulmonary function test as this will allow [place] to evaluate her dyspnea further.  We will see her in follow-up in 2 months time she is to contact us prior to that time should any new difficulties arise  C. Derrill Kay, MD [place] [facility]   *This note was dictated using voice recognition software/Dragon.  Despite best efforts to proofread, errors can occur which can change the meaning.  Any change was purely unintentional.

## 2019-08-26 ENCOUNTER — Encounter: Payer: Self-pay | Admitting: Pulmonary Disease

## 2019-08-27 ENCOUNTER — Other Ambulatory Visit: Payer: Self-pay | Admitting: Cardiovascular Disease

## 2019-08-27 DIAGNOSIS — M79604 Pain in right leg: Secondary | ICD-10-CM

## 2019-08-27 DIAGNOSIS — M79605 Pain in left leg: Secondary | ICD-10-CM

## 2019-08-28 ENCOUNTER — Other Ambulatory Visit: Payer: Self-pay

## 2019-08-28 DIAGNOSIS — G4733 Obstructive sleep apnea (adult) (pediatric): Secondary | ICD-10-CM

## 2019-08-29 ENCOUNTER — Other Ambulatory Visit: Payer: Self-pay

## 2019-08-29 ENCOUNTER — Other Ambulatory Visit
Admission: RE | Admit: 2019-08-29 | Discharge: 2019-08-29 | Disposition: A | Discharge status: Home / Self Care | Payer: Medicare Other | Source: Ambulatory Visit | Attending: Pulmonary Disease | Admitting: Pulmonary Disease

## 2019-08-29 DIAGNOSIS — Z20822 Contact with and (suspected) exposure to covid-19: Secondary | ICD-10-CM | POA: Diagnosis not present

## 2019-08-29 DIAGNOSIS — Z01812 Encounter for preprocedural laboratory examination: Secondary | ICD-10-CM | POA: Insufficient documentation

## 2019-08-29 LAB — SARS CORONAVIRUS 2 (TAT 6-24 HRS): SARS Coronavirus 2: NEGATIVE

## 2019-09-01 ENCOUNTER — Ambulatory Visit
Admission: RE | Admit: 2019-09-01 | Discharge: 2019-09-01 | Disposition: A | Discharge status: Home / Self Care | Payer: Medicare Other | Source: Ambulatory Visit | Attending: Pulmonary Disease | Admitting: Pulmonary Disease

## 2019-09-01 ENCOUNTER — Ambulatory Visit (HOSPITAL_COMMUNITY): Payer: Medicare Other

## 2019-09-01 ENCOUNTER — Other Ambulatory Visit: Payer: Self-pay

## 2019-09-01 DIAGNOSIS — J9611 Chronic respiratory failure with hypoxia: Secondary | ICD-10-CM | POA: Diagnosis not present

## 2019-09-01 DIAGNOSIS — I2722 Pulmonary hypertension due to left heart disease: Secondary | ICD-10-CM | POA: Diagnosis not present

## 2019-09-01 DIAGNOSIS — I5032 Chronic diastolic (congestive) heart failure: Secondary | ICD-10-CM | POA: Diagnosis not present

## 2019-09-01 DIAGNOSIS — R0602 Shortness of breath: Secondary | ICD-10-CM

## 2019-09-01 MED ORDER — ALBUTEROL SULFATE (2.5 MG/3ML) 0.083% IN NEBU
2.5000 mg | INHALATION_SOLUTION | Freq: Once | RESPIRATORY_TRACT | Status: AC
  Administered 2019-09-01: 2.5 mg via RESPIRATORY_TRACT
  Filled 2019-09-01: qty 3

## 2019-09-01 NOTE — Progress Notes (Signed)
*  PRELIMINARY RESULTS* Echocardiogram 2D Echocardiogram has been performed.  Sherrie Sport 09/01/2019, 10:44 AM

## 2019-09-03 ENCOUNTER — Ambulatory Visit (INDEPENDENT_AMBULATORY_CARE_PROVIDER_SITE_OTHER): Payer: Medicare Other

## 2019-09-03 ENCOUNTER — Other Ambulatory Visit: Payer: Self-pay

## 2019-09-03 DIAGNOSIS — M79604 Pain in right leg: Secondary | ICD-10-CM

## 2019-09-03 DIAGNOSIS — M79605 Pain in left leg: Secondary | ICD-10-CM

## 2019-09-03 NOTE — Progress Notes (Signed)
Patient called and identification verified.   Per Carolyn Gambles MD, patient's pulmonary function test is consistent with severe COPD, she does have an asthma component that improves significantly with her inhalers.  Echocardiogram does show evidence of pulmonary hypertension that has been shown previously but the severity cannot be classified.  We will discuss more in depth on follow-up visit.  Patient verbalized understanding of results and ongoing plan of care.

## 2019-09-04 ENCOUNTER — Telehealth: Payer: Self-pay

## 2019-09-04 NOTE — Telephone Encounter (Signed)
Call to patient to review u/s.    Spoke to daughter, she verbalized understanding and has no further questions at this time.    Advised pt to call for any further questions or concerns.  No further orders.   Pt to call back to make an appt in a few weeks to discuss further POC.

## 2019-09-04 NOTE — Telephone Encounter (Signed)
-----  Message from Wellington Hampshire, MD sent at 09/04/2019  9:24 AM EDT ----- Doppler confirmed that she does have peripheral arterial disease but is actually worse on the right side than the left side.  She is reported that her pain in the left leg is more than the right and thus there might be another etiology.  Have her follow-up with me in f few weeks to go over this and see if angiography is needed.

## 2019-09-15 ENCOUNTER — Telehealth: Payer: Self-pay | Admitting: Pulmonary Disease

## 2019-09-15 NOTE — Telephone Encounter (Signed)
Called and spoke with patient about ONO results. Patient needs to wear O2 at night/ when sleeping. Patient expressed understanding. Report will be placed in scan folder. Nothing further needed at this time.

## 2019-10-21 ENCOUNTER — Encounter: Payer: Self-pay | Admitting: Cardiovascular Disease

## 2019-10-21 ENCOUNTER — Ambulatory Visit: Payer: Medicare Other | Admitting: Cardiovascular Disease

## 2019-10-21 ENCOUNTER — Other Ambulatory Visit: Payer: Self-pay

## 2019-10-21 VITALS — BP 140/60 | HR 89 | Ht 61.0 in | Wt 137.8 lb

## 2019-10-21 DIAGNOSIS — E785 Hyperlipidemia, unspecified: Secondary | ICD-10-CM | POA: Diagnosis not present

## 2019-10-21 DIAGNOSIS — I5032 Chronic diastolic (congestive) heart failure: Secondary | ICD-10-CM | POA: Diagnosis not present

## 2019-10-21 DIAGNOSIS — I739 Peripheral vascular disease, unspecified: Secondary | ICD-10-CM

## 2019-10-21 DIAGNOSIS — I10 Essential (primary) hypertension: Secondary | ICD-10-CM | POA: Diagnosis not present

## 2019-10-21 NOTE — Patient Instructions (Signed)
Medication Instructions:  No changes  *If you need a refill on your cardiac medications before your next appointment, please call your pharmacy*   Lab Work: None  If you have labs (blood work) drawn today and your tests are completely normal, you will receive your results only by: Marland Kitchen MyChart Message (if you have MyChart) OR . A paper copy in the mail If you have any lab test that is abnormal or we need to change your treatment, we will call you to review the results.   Testing/Procedures: None   Follow-Up: At [facility], you and your health needs are our priority.  As part of our continuing mission to provide you with exceptional heart care, we have created designated Provider Care Teams.  These Care Teams include your primary Cardiologist (physician) and Advanced Practice Providers (APPs -  Physician Assistants and Nurse Practitioners) who all work together to provide you with the care you need, when you need it.  We recommend signing up for the patient portal called "MyChart".  Sign up information is provided on this After Visit Summary.  MyChart is used to connect with patients for Virtual Visits (Telemedicine).  Patients are able to view lab/test results, encounter notes, upcoming appointments, etc.  Non-urgent messages can be sent to your provider as well.   To learn more about what you can do with MyChart, go to [email or web address].    Your next appointment:   6 month(s)  The format for your next appointment:   In Person  Provider:    You may see Kathlyn Sacramento, MD or one of the following Advanced Practice Providers on your designated Care Team:    Murray Hodgkins, NP  Christell Faith, PA-C  Marrianne Mood, PA-C

## 2019-10-21 NOTE — Progress Notes (Signed)
Cardiology Office Note   Date:  10/21/2019   ID:  Carolyn Steele, DOB 08-14-1945, MRN [id number]  PCP:  Carolyn Billet, MD  Cardiologist:   Kathlyn Sacramento, MD   Chief Complaint  Patient presents with  . OTHER    F/u echo/PAD no complaints today. Meds reviewed verbally with pt.      History of Present Illness: Carolyn Steele is a 74 y.o. female who presents for a follow-up visit regarding chronic diastolic heart failure. She has chronic medical conditions including essential hypertension, hyperlipidemia, COPD on home oxygen, GERD, hypothyroidism and depression. She was admitted to [facility] in September with heart failure in the setting of uncontrolled hypertension.  Echo showed normal LV systolic function with evidence of diastolic dysfunction.  Troponin was mildly elevated thought to be due to supply demand ischemia.  Stress test showed no evidence of ischemia or infarct. She has chronic low back pain. She was referred to pulmonary and was diagnosed with severe COPD based on PFTs. Nonetheless, they were able to wean her off daytime oxygen and she is currently using oxygen therapy at night only. She had a repeat echocardiogram done in May which showed normal LV systolic function, normal diastolic function, mild to moderate tricuspid regurgitation with mild pulmonary hypertension. Estimated peak systolic pulmonary pressure was 45 mmHg. In addition, given lower back pain and abnormal distal pulses, she underwent vascular testing which showed an ABI of 0.75 on the right and 0.95 on the left. Duplex showed significant iliac disease worse on the right side. She denies leg claudication.  Past Medical History:  Diagnosis Date  . (HFpEF) heart failure with preserved ejection fraction ([place])    a. 12/2018 Echo: EF 60-65%, pseudonormalization. Nl RV fxn. RVSP 51 mmHg. Mild MR/TR.  Marland Kitchen Acid reflux   . Arthritis   . CHF (congestive heart failure) ([place])   . COPD (chronic obstructive pulmonary  disease) ([place])    a. Home O2 started 12/2018.  Marland Kitchen Demand ischemia ([place])    a. 12/2018 mild hsTrop elevation in setting of diast chf/htn urgency-->MV: no ischemia/infarct. EF 55-65%.  . Depression   . Hypercholesteremia   . Hypothyroidism   . Wears glasses     Past Surgical History:  Procedure Laterality Date  . CHOLECYSTECTOMY    . COLONOSCOPY    . FOOT SURGERY Right   . HAND SURGERY Bilateral   . SHOULDER SURGERY Left   . SPINAL CORD STIMULATOR INSERTION N/A 06/04/2015   Procedure: LUMBAR SPINAL CORD STIMULATOR INSERTION;  Surgeon: Clydell Hakim, MD;  Location: [place] NEURO ORS;  Service: Neurosurgery;  Laterality: N/A;     Current Outpatient Medications  Medication Sig Dispense Refill  . alendronate (FOSAMAX) 70 MG tablet Take 70 mg by mouth once a week. Take with a full glass of water on an empty stomach. Every Sunday    . aspirin 81 MG tablet Take 81 mg by mouth daily.    . Calcium Carb-Cholecalciferol (CALCIUM 600 + D PO) Take 1 tablet by mouth daily.    . Cranberry-Vitamin C 140-100 MG CAPS Take 2 capsules by mouth daily.     . Cyanocobalamin (VITAMIN B 12 PO) Take 1,000 mg by mouth daily.    . fexofenadine (ALLEGRA) 180 MG tablet Take 180 mg by mouth daily.    . furosemide (LASIX) 20 MG tablet Take 1 tablet (20 mg total) by mouth daily. 90 tablet 2  . gabapentin (NEURONTIN) 300 MG capsule Take 300 mg by mouth daily.    Marland Kitchen  Glucosamine HCl 1500 MG TABS Take 1 tablet by mouth daily.    Marland Kitchen guaifenesin (MUCUS RELIEF) 400 MG TABS tablet Take 400 mg by mouth daily.    Marland Kitchen levothyroxine (SYNTHROID, LEVOTHROID) 150 MCG tablet Take 150 mcg by mouth daily before breakfast. Take 1 tablet M, T, Thurs, Fri and Sat. Take 1/2 tablet Wednesday and Skip Sunday.    . losartan (COZAAR) 25 MG tablet Take 1 tablet (25 mg total) by mouth daily. 90 tablet 3  . Multiple Vitamins-Minerals (MULTIVITAMIN & MINERAL PO) Take 1 tablet by mouth daily.    . Multiple Vitamins-Minerals (PRESERVISION AREDS 2) CAPS in the  morning and at bedtime.    . Omega-3 Fatty Acids (FISH OIL) 1000 MG CAPS Take 500 mg by mouth daily.     Marland Kitchen omeprazole (PRILOSEC) 40 MG capsule Take 40 mg by mouth daily.    . OXYGEN Inhale 2 L into the lungs daily.    . pravastatin (PRAVACHOL) 40 MG tablet Take 40 mg by mouth daily.    . traMADol (ULTRAM) 50 MG tablet Take 50 mg by mouth every 6 (six) hours as needed for moderate pain.      No current facility-administered medications for this visit.    Allergies:   Celebrex [celecoxib], Mobic [meloxicam], and Adhesive [tape]    Social History:  The patient  reports that she has quit smoking. Her smoking use included cigarettes. She has a 40.00 pack-year smoking history. She has never used smokeless tobacco. She reports that she does not drink alcohol and does not use drugs.   Family History:  The patient's family history includes Heart failure in her father; Lung cancer in her mother.    ROS:  Please see the history of present illness.   Otherwise, review of systems are positive for none.   All other systems are reviewed and negative.    PHYSICAL EXAM: VS:  BP 140/60 (BP Location: Left Arm, Patient Position: Sitting, Cuff Size: Normal)   Pulse 89   Ht _0  (1.549 m)   Wt 137 lb 12 oz (62.5 kg)   SpO2 94%   BMI 26.03 kg/m  , BMI Body mass index is 26.03 kg/m. GEN: Well nourished, well developed, in no acute distress  HEENT: normal  Neck: no JVD, carotid bruits, or masses Cardiac: RRR; no murmurs, rubs, or gallops,no edema  Respiratory: Diminished breath sounds bilaterally GI: soft, nontender, nondistended, + BS MS: no deformity or atrophy  Skin: warm and dry, no rash Neuro:  Strength and sensation are intact Psych: euthymic mood, full affect Vascular: Femoral pulses mildly diminished on the right side.  Distal pulses are diminished.   EKG:  EKG is ordered today. The ekg ordered today demonstrates normal sinus rhythm with low voltage   Recent Labs: 01/10/2019: B  Natriuretic Peptide 531.0 01/11/2019: ALT 246 01/13/2019: Hemoglobin 11.8; Platelets 351 02/06/2019: BUN 12; Creatinine, Ser 0.57; Potassium 4.3; Sodium 137    Lipid Panel    Component Value Date/Time   CHOL 187 01/13/2019 1238   TRIG 84 01/13/2019 1238   HDL 69 01/13/2019 1238   CHOLHDL 2.7 01/13/2019 1238   VLDL 17 01/13/2019 1238   LDLCALC 101 (H) 01/13/2019 1238      Wt Readings from Last 3 Encounters:  10/21/19 137 lb 12 oz (62.5 kg)  08/21/19 135 lb 12.8 oz (61.6 kg)  07/24/19 138 lb 6 oz (62.8 kg)       No flowsheet data found.    ASSESSMENT AND  PLAN:  1.  Chronic diastolic heart failure: She appears to be euvolemic on small dose furosemide.  Blood pressure is under excellent control.  Continue same medications  2.  COPD: Followed by pulmonary.  3.  Essential hypertension: Blood pressure is controlled  4.  Hyperlipidemia: Currently on Pravachol. Recommend a target LDL of less than 70.  5. Peripheral arterial disease: Noninvasive testing did confirm the presence of peripheral arterial disease but she has minimal claudication at the present time. Recommend continuing medical therapy.   Disposition:   FU with me in 6 months  Signed,  Kathlyn Sacramento, MD  10/21/2019 4:18 PM    [facility]

## 2019-10-22 ENCOUNTER — Ambulatory Visit: Payer: Medicare Other | Admitting: Pulmonary Disease

## 2019-10-22 ENCOUNTER — Encounter: Payer: Self-pay | Admitting: Pulmonary Disease

## 2019-10-22 VITALS — BP 136/70 | HR 71 | Temp 98.1°F | Ht 61.0 in | Wt 137.2 lb

## 2019-10-22 DIAGNOSIS — G4733 Obstructive sleep apnea (adult) (pediatric): Secondary | ICD-10-CM

## 2019-10-22 MED ORDER — TRELEGY ELLIPTA 100-62.5-25 MCG/INH IN AEPB: 1.0000 | INHALATION_SPRAY | Freq: Every day | RESPIRATORY_TRACT | 0 refills | Status: AC

## 2019-10-22 MED ORDER — TRELEGY ELLIPTA 100-62.5-25 MCG/INH IN AEPB: 1.0000 | INHALATION_SPRAY | Freq: Every day | RESPIRATORY_TRACT | 2 refills | Status: DC

## 2019-10-22 MED ORDER — ALBUTEROL SULFATE HFA 108 (90 BASE) MCG/ACT IN AERS
2.0000 | INHALATION_SPRAY | Freq: Four times a day (QID) | RESPIRATORY_TRACT | 1 refills | Status: DC | PRN
Start: 2019-10-22 — End: 2019-10-23

## 2019-10-22 NOTE — Progress Notes (Incomplete)
   Subjective:    Patient ID: Carolyn Steele, female    DOB: 1946-01-08, 74 y.o.   MRN: [id number]  HPI    Review of Systems A 10 point review of systems was performed and it is as noted above otherwise negative.  Allergies  Allergen Reactions  . Celebrex [Celecoxib] Swelling  . Mobic [Meloxicam] Swelling  . Adhesive [Tape] Itching and Rash   Current Meds  Medication Sig  . alendronate (FOSAMAX) 70 MG tablet Take 70 mg by mouth once a week. Take with a full glass of water on an empty stomach. Every Sunday  . aspirin 81 MG tablet Take 81 mg by mouth daily.  . Calcium Carb-Cholecalciferol (CALCIUM 600 + D PO) Take 1 tablet by mouth daily.  . Cranberry-Vitamin C 140-100 MG CAPS Take 2 capsules by mouth daily.   . Cyanocobalamin (VITAMIN B 12 PO) Take 1,000 mg by mouth daily.  . fexofenadine (ALLEGRA) 180 MG tablet Take 180 mg by mouth daily.  . furosemide (LASIX) 20 MG tablet Take 1 tablet (20 mg total) by mouth daily.  Marland Kitchen gabapentin (NEURONTIN) 300 MG capsule Take 300 mg by mouth daily.  . Glucosamine HCl 1500 MG TABS Take 1 tablet by mouth daily.  Marland Kitchen guaifenesin (MUCUS RELIEF) 400 MG TABS tablet Take 400 mg by mouth daily.  Marland Kitchen levothyroxine (SYNTHROID, LEVOTHROID) 150 MCG tablet Take 150 mcg by mouth daily before breakfast. Take 1 tablet M, T, Thurs, Fri and Sat. Take 1/2 tablet Wednesday and Skip Sunday.  . losartan (COZAAR) 25 MG tablet Take 1 tablet (25 mg total) by mouth daily.  . Multiple Vitamins-Minerals (MULTIVITAMIN & MINERAL PO) Take 1 tablet by mouth daily.  . Multiple Vitamins-Minerals (PRESERVISION AREDS 2) CAPS in the morning and at bedtime.  . Omega-3 Fatty Acids (FISH OIL) 1000 MG CAPS Take 500 mg by mouth daily.   Marland Kitchen omeprazole (PRILOSEC) 40 MG capsule Take 40 mg by mouth daily.  . OXYGEN Inhale 2 L into the lungs daily.  . pravastatin (PRAVACHOL) 40 MG tablet Take 40 mg by mouth daily.  . traMADol (ULTRAM) 50 MG tablet Take 50 mg by mouth every 6 (six) hours as  needed for moderate pain.    Immunization History  Administered Date(s) Administered  . Fluad Quad(high Dose 65+) 01/30/2019  . Zoster Recombinat (Shingrix) 06/17/2018, 11/20/2018       Objective:   Physical Exam BP 136/70 (BP Location: Left Arm, Cuff Size: Normal)   Pulse 71   Temp 98.1 F (36.7 C) (Temporal)   Ht _0  (1.549 m)   Wt 137 lb 3.2 oz (62.2 kg)   SpO2 98%   BMI 25.92 kg/m  GENERAL: HEAD: Normocephalic, atraumatic.  EYES: Pupils equal, round, reactive to light.  No scleral icterus.  MOUTH:  NECK: Supple. No thyromegaly. Trachea midline. No JVD.  No adenopathy. PULMONARY: Lungs clear to auscultation bilaterally. CARDIOVASCULAR: S1 and S2. Regular rate and rhythm.  GASTROINTESTINAL: MUSCULOSKELETAL: No joint deformity, no clubbing, no edema.  NEUROLOGIC:  SKIN: Intact,warm,dry. PSYCH:      Assessment & Plan:

## 2019-10-22 NOTE — Patient Instructions (Addendum)
As we discussed you have COPD and asthma issues.  We are prescribing Trelegy 1 inhalation daily this should help with your breathing.  We are also providing you with a rescue inhaler.  Continue using your oxygen at nighttime.  This will help relax your heart and will also help with your shortness of breath during the day.  We will see him in follow-up in 3 months time call sooner should any new problems arise.

## 2019-10-23 MED ORDER — ALBUTEROL SULFATE HFA 108 (90 BASE) MCG/ACT IN AERS: 2.0000 | INHALATION_SPRAY | Freq: Four times a day (QID) | RESPIRATORY_TRACT | 1 refills | Status: DC | PRN

## 2019-10-28 ENCOUNTER — Other Ambulatory Visit: Payer: Self-pay | Admitting: Internal Medicine

## 2019-10-28 DIAGNOSIS — Z1231 Encounter for screening mammogram for malignant neoplasm of breast: Secondary | ICD-10-CM

## 2019-11-24 ENCOUNTER — Ambulatory Visit
Admission: RE | Admit: 2019-11-24 | Discharge: 2019-11-24 | Disposition: A | Discharge status: Home / Self Care | Payer: Medicare Other | Source: Ambulatory Visit | Attending: Internal Medicine | Admitting: Internal Medicine

## 2019-11-24 ENCOUNTER — Other Ambulatory Visit: Payer: Self-pay

## 2019-11-24 DIAGNOSIS — Z1231 Encounter for screening mammogram for malignant neoplasm of breast: Secondary | ICD-10-CM | POA: Diagnosis present

## 2019-11-26 ENCOUNTER — Telehealth: Payer: Self-pay

## 2019-11-26 NOTE — Telephone Encounter (Signed)
Suanne Marker called pt to schedule HST that was ordered on 08/28/2019. Pt stated that she was not made aware that this and did not wish to schedule until she spoke with someone regarding it.  I spoke with pt and made her aware that Dr. Patsey Berthold recommended sleep study based off of ONO results. (please see ONO results within media tab).  Per telephone encounter on 09/15/2019, pt was made aware of results but it does not mention sleep study.  Pt does not wish to proceed with HST until speaking to Dr. Orlin Hilding Recall is pending for 01/2020. Pt stated that she would discuss this further with Dr. Patsey Berthold at the time of her visit.   Will route to [facility] to make aware.

## 2019-11-27 NOTE — Telephone Encounter (Signed)
Noted on the order. Carolyn Steele

## 2019-12-01 ENCOUNTER — Other Ambulatory Visit: Payer: Self-pay | Admitting: *Deleted

## 2019-12-01 MED ORDER — LOSARTAN POTASSIUM 25 MG PO TABS: 25.0000 mg | ORAL_TABLET | Freq: Every day | ORAL | 0 refills | Status: DC

## 2020-01-12 DIAGNOSIS — J449 Chronic obstructive pulmonary disease, unspecified: Secondary | ICD-10-CM | POA: Insufficient documentation

## 2020-01-22 ENCOUNTER — Ambulatory Visit: Payer: Medicare Other | Admitting: Cardiovascular Disease

## 2020-03-01 ENCOUNTER — Ambulatory Visit: Payer: Medicare Other | Admitting: Pulmonary Disease

## 2020-03-01 ENCOUNTER — Encounter: Payer: Self-pay | Admitting: Pulmonary Disease

## 2020-03-01 ENCOUNTER — Other Ambulatory Visit: Payer: Self-pay

## 2020-03-01 VITALS — BP 130/74 | HR 79 | Temp 97.3°F | Ht 60.0 in | Wt 139.2 lb

## 2020-03-01 DIAGNOSIS — I272 Pulmonary hypertension, unspecified: Secondary | ICD-10-CM | POA: Diagnosis not present

## 2020-03-01 DIAGNOSIS — J449 Chronic obstructive pulmonary disease, unspecified: Secondary | ICD-10-CM | POA: Diagnosis not present

## 2020-03-01 DIAGNOSIS — I2729 Other secondary pulmonary hypertension: Secondary | ICD-10-CM | POA: Diagnosis not present

## 2020-03-01 DIAGNOSIS — I5032 Chronic diastolic (congestive) heart failure: Secondary | ICD-10-CM | POA: Diagnosis not present

## 2020-03-01 DIAGNOSIS — G4736 Sleep related hypoventilation in conditions classified elsewhere: Secondary | ICD-10-CM

## 2020-03-01 NOTE — Patient Instructions (Signed)
Continue taking Trelegy daily.  Continue your oxygen.  See him in follow-up in 6 months time call sooner should any new problems arise.

## 2020-03-01 NOTE — Progress Notes (Signed)
Subjective:    Patient ID: Carolyn Steele Reason, female    DOB: 1946-01-22, 74 y.o.   MRN: [id number] Requesting MD/Service: Benita Stabile MD Date of initial consultation: 21 August 2019 Reason for consultation: Shortness of breath   PT PROFILE: 74 year old former smoker (quit 2010, 40-pack-year history) with shortness of breath and known history of pulmonary hypertension and diastolic dysfunction.  Evaluated for shortness of breath due to severe COPD.   DATA: 01/10/2019 CT angio chest: No PE, bilateral pleural effusions, small, right lung compressive atelectasis, cardiomegaly, centrilobular emphysema.  There is also moderate hiatal hernia 08/28/2019 overnight oximetry: Desaturations to as low as 83%, patient instructed to use 2 L/min nocturnally 09/01/2019 PFTs: FEV1 0.75 L or 40% predicted FEV1/FVC 46%, FVC 1.63 L or 66% predicted.  Postbronchodilator patient had 34% change in FEV1 and 27% change in FVC indicating asthmatic component.  Diffusion capacity moderately reduced.  Consistent with severe COPD with asthmatic component 09/01/2019 2D echo: LVEF is 55 to 60%, mild LVH, mild elevation of the pulmonary artery systolic pressure not determined, tricuspid regurgitation mild to moderate  INTERVAL: Since her initial visit here on 08/21/2019 she had patient to [facility] due to large left pneumothorax.  The patient apparently had spontaneous rib fractures due to severe osteoporosis.  She is now starting Forteo daily.  Followed at [facility] endocrinology for severe osteoporosis.   HPI As noted above patient had admission at [facility] for large secondary spontaneous pneumothorax on the left due to rib fractures that occurred when she was getting groceries in her home.  She was actually pulling on a cart.  She had multiple fractures on the left.  Since that occurrence she has now been set to start Forteo for severe osteoporosis.  From the respiratory standpoint she has no complaints.  She has done well since starting  Trelegy.  She has had no fevers, chills or sweats.  No chest pain since her issue with the pneumothorax.  No lower extremity edema.  She is compliant with nocturnal oxygen at 2 L/min.   Review of Systems A 10 point review of systems was performed and it is as noted above otherwise negative.  Allergies  Allergen Reactions   Celebrex [Celecoxib] Swelling   Mobic [Meloxicam] Swelling   Adhesive [Tape] Itching and Rash   Current Meds  Medication Sig   albuterol (PROAIR HFA) 108 (90 Base) MCG/ACT inhaler Inhale 2 puffs into the lungs every 6 (six) hours as needed for wheezing or shortness of breath.   alendronate (FOSAMAX) 70 MG tablet Take 70 mg by mouth once a week. Take with a full glass of water on an empty stomach. Every Sunday   aspirin 81 MG tablet Take 81 mg by mouth daily.   Calcium Carb-Cholecalciferol (CALCIUM 600 + D PO) Take 1 tablet by mouth daily.   Cranberry-Vitamin C 140-100 MG CAPS Take 2 capsules by mouth daily.    Cyanocobalamin (VITAMIN B 12 PO) Take 1,000 mg by mouth daily.   fexofenadine (ALLEGRA) 180 MG tablet Take 180 mg by mouth daily.   Fluticasone-Umeclidin-Vilant (TRELEGY ELLIPTA) 100-62.5-25 MCG/INH AEPB Inhale 1 puff into the lungs daily.   furosemide (LASIX) 20 MG tablet Take 1 tablet (20 mg total) by mouth daily.   gabapentin (NEURONTIN) 300 MG capsule Take 300 mg by mouth daily.   Glucosamine HCl 1500 MG TABS Take 1 tablet by mouth daily.   guaifenesin (MUCUS RELIEF) 400 MG TABS tablet Take 400 mg by mouth daily.   levothyroxine (SYNTHROID, LEVOTHROID) 150  MCG tablet Take 150 mcg by mouth daily before breakfast. Take 1 tablet M, T, Thurs, Fri and Sat. Take 1/2 tablet Wednesday and Skip Sunday.   losartan (COZAAR) 25 MG tablet Take 1 tablet (25 mg total) by mouth daily.   Multiple Vitamins-Minerals (MULTIVITAMIN & MINERAL PO) Take 1 tablet by mouth daily.   Multiple Vitamins-Minerals (PRESERVISION AREDS 2) CAPS in the morning and at bedtime.    Omega-3 Fatty Acids (FISH OIL) 1000 MG CAPS Take 500 mg by mouth daily.    omeprazole (PRILOSEC) 40 MG capsule Take 40 mg by mouth daily.   OXYGEN Inhale 2 L into the lungs daily.   pravastatin (PRAVACHOL) 40 MG tablet Take 40 mg by mouth daily.   traMADol (ULTRAM) 50 MG tablet Take 50 mg by mouth every 6 (six) hours as needed for moderate pain.    Immunization History  Administered Date(s) Administered   Fluad Quad(high Dose 65+) 01/30/2019   Influenza-Unspecified 02/16/2020   Zoster Recombinat (Shingrix) 06/17/2018, 11/20/2018      Objective:   Physical Exam BP 130/74 (BP Location: Left Arm, Cuff Size: Normal)    Pulse 79    Temp (!) 97.3 F (36.3 C) (Temporal)    Ht 5' (1.524 m)    Wt 139 lb 3.2 oz (63.1 kg)    SpO2 97%    BMI 27.19 kg/m   GENERAL: Frail-appearing woman, no acute distress, fully ambulatory.  Assist cane for assistance. HEAD: Normocephalic, atraumatic.  EYES: Pupils equal, round, reactive to light.  No scleral icterus.  MOUTH: Nose/mouth/throat not examined due to masking requirements for COVID 19. NECK: Supple. No thyromegaly. Trachea midline. No JVD.  No adenopathy. PULMONARY: Increased AP diameter.  Good air entry bilaterally.  Coarse breath sounds otherwise no adventitious sounds. CARDIOVASCULAR: S1 and S2. Regular rate and rhythm.  ABDOMEN: Benign. MUSCULOSKELETAL: Significant kyphosis.  No joint deformity, no clubbing, no edema.  NEUROLOGIC: No focal deficit, speech is fluent, gait without disturbance though does use cane for steadying. SKIN: Intact,warm,dry. PSYCH: Mood and behavior are normal.     Assessment & Plan:     ICD-10-CM   1. Asthma-COPD overlap syndrome ([place])  J44.9    Continue Trelegy and as needed albuterol Well compensated  2. Pulmonary hypertension (HCC)  I27.20    Due to diastolic dysfunction as well as COPD Continue nocturnal oxygen supplementation  3. Chronic diastolic heart failure (HCC)  I50.32    Followed by  cardiology This issue adds complexity to her management  4. 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  Continue current therapy.  We will see her in follow-up in 6 months time she is to contact us prior to that time should any new difficulties arise.  Renold Don, MD  PCCM   *This note was dictated using voice recognition software/Dragon.  Despite best efforts to proofread, errors can occur which can change the meaning.  Any change was purely unintentional.

## 2020-04-21 ENCOUNTER — Other Ambulatory Visit: Payer: Self-pay

## 2020-04-21 ENCOUNTER — Encounter: Payer: Self-pay | Admitting: Cardiovascular Disease

## 2020-04-21 ENCOUNTER — Ambulatory Visit: Payer: Medicare Other | Admitting: Cardiovascular Disease

## 2020-04-21 VITALS — BP 142/60 | HR 82 | Ht 60.0 in | Wt 139.2 lb

## 2020-04-21 DIAGNOSIS — I739 Peripheral vascular disease, unspecified: Secondary | ICD-10-CM

## 2020-04-21 DIAGNOSIS — I10 Essential (primary) hypertension: Secondary | ICD-10-CM

## 2020-04-21 DIAGNOSIS — J449 Chronic obstructive pulmonary disease, unspecified: Secondary | ICD-10-CM

## 2020-04-21 DIAGNOSIS — E782 Mixed hyperlipidemia: Secondary | ICD-10-CM | POA: Diagnosis not present

## 2020-04-21 DIAGNOSIS — I5032 Chronic diastolic (congestive) heart failure: Secondary | ICD-10-CM

## 2020-04-21 MED ORDER — LOSARTAN POTASSIUM 50 MG PO TABS
50.0000 mg | ORAL_TABLET | Freq: Every day | ORAL | 3 refills | Status: DC
Start: 2020-04-21 — End: 2021-02-10

## 2020-04-21 MED ORDER — ATORVASTATIN CALCIUM 20 MG PO TABS: 20.0000 mg | ORAL_TABLET | Freq: Every day | ORAL | 3 refills | Status: DC

## 2020-04-21 NOTE — Progress Notes (Signed)
Cardiology Office Note   Date:  04/21/2020   ID:  Carolyn Steele, DOB Dec 28, 1945, MRN [id number]  PCP:  Albina Billet, MD  Cardiologist:   Kathlyn Sacramento, MD   Chief Complaint  Patient presents with  . Follow-up    6 month. Meds reviewed by the patient verbally. "doing well."       History of Present Illness: Carolyn Steele is a 74 y.o. female who presents for a follow-up visit regarding chronic diastolic heart failure. She has chronic medical conditions including essential hypertension, hyperlipidemia, COPD on home oxygen, GERD, hypothyroidism and depression. She was admitted to [facility] in September of 2020 with heart failure in the setting of uncontrolled hypertension.  Echo showed normal LV systolic function with evidence of diastolic dysfunction.  Troponin was mildly elevated thought to be due to supply demand ischemia.  Stress test showed no evidence of ischemia or infarct. She has chronic low back pain.  She has known severe COPD based on pulmonary function testing.  However, she has to use oxygen only at night. Echocardiogram in May 2021 showed normal LV systolic function, normal diastolic function, mild to moderate tricuspid regurgitation with mild pulmonary hypertension. Estimated peak systolic pulmonary pressure was 45 mmHg. In addition, given lower back pain and abnormal distal pulses, she underwent vascular testing which showed an ABI of 0.75 on the right and 0.95 on the left. Duplex showed significant iliac disease worse on the right side. She denies leg claudication. She was hospitalized in September at [facility] with a large pneumothorax after she fractured her ribs from a fall.  She required a chest tube and was there for 5 days.  She is back to baseline now.  She reports no new symptoms.  No chest pain.  Blood pressure tends to be elevated.  Past Medical History:  Diagnosis Date  . (HFpEF) heart failure with preserved ejection fraction ([place])    a. 12/2018 Echo: EF  60-65%, pseudonormalization. Nl RV fxn. RVSP 51 mmHg. Mild MR/TR.  Marland Kitchen Acid reflux   . Arthritis   . CHF (congestive heart failure) ([place])   . COPD (chronic obstructive pulmonary disease) ([place])    a. Home O2 started 12/2018.  Marland Kitchen Demand ischemia ([place])    a. 12/2018 mild hsTrop elevation in setting of diast chf/htn urgency-->MV: no ischemia/infarct. EF 55-65%.  . Depression   . Hypercholesteremia   . Hypothyroidism   . Wears glasses     Past Surgical History:  Procedure Laterality Date  . CHOLECYSTECTOMY    . COLONOSCOPY    . FOOT SURGERY Right   . HAND SURGERY Bilateral   . SHOULDER SURGERY Left   . SPINAL CORD STIMULATOR INSERTION N/A 06/04/2015   Procedure: LUMBAR SPINAL CORD STIMULATOR INSERTION;  Surgeon: Clydell Hakim, MD;  Location: [place] NEURO ORS;  Service: Neurosurgery;  Laterality: N/A;     Current Outpatient Medications  Medication Sig Dispense Refill  . albuterol (PROAIR HFA) 108 (90 Base) MCG/ACT inhaler Inhale 2 puffs into the lungs every 6 (six) hours as needed for wheezing or shortness of breath. 54 g 1  . alendronate (FOSAMAX) 70 MG tablet Take 70 mg by mouth once a week. Take with a full glass of water on an empty stomach. Every Sunday    . aspirin 81 MG tablet Take 81 mg by mouth daily.    . Calcium Carb-Cholecalciferol (CALCIUM 600 + D PO) Take 1 tablet by mouth daily.    . Cranberry-Vitamin C 140-100 MG CAPS  Take 2 capsules by mouth daily.     . Cyanocobalamin (VITAMIN B 12 PO) Take 1,000 mg by mouth daily.    . fexofenadine (ALLEGRA) 180 MG tablet Take 180 mg by mouth daily.    . Fluticasone-Umeclidin-Vilant (TRELEGY ELLIPTA) 100-62.5-25 MCG/INH AEPB Inhale 1 puff into the lungs daily. 180 each 2  . furosemide (LASIX) 20 MG tablet Take 1 tablet (20 mg total) by mouth daily. 90 tablet 2  . gabapentin (NEURONTIN) 300 MG capsule Take 300 mg by mouth daily.    . Glucosamine HCl 1500 MG TABS Take 1 tablet by mouth daily.    Marland Kitchen guaifenesin (HUMIBID E) 400 MG TABS tablet Take  400 mg by mouth daily.    Marland Kitchen levothyroxine (SYNTHROID, LEVOTHROID) 150 MCG tablet Take 150 mcg by mouth daily before breakfast. Take 1 tablet M, T, Thurs, Fri and Sat. Take 1/2 tablet Wednesday and Skip Sunday.    . Multiple Vitamins-Minerals (MULTIVITAMIN & MINERAL PO) Take 1 tablet by mouth daily.    . Multiple Vitamins-Minerals (PRESERVISION AREDS 2) CAPS in the morning and at bedtime.    . Omega-3 Fatty Acids (FISH OIL) 1000 MG CAPS Take 500 mg by mouth daily.     Marland Kitchen omeprazole (PRILOSEC) 40 MG capsule Take 40 mg by mouth daily.    . OXYGEN Inhale 2 L into the lungs daily.    . Teriparatide, Recombinant, 620 MCG/2.48ML SOPN Inject into the skin daily.    . traMADol (ULTRAM) 50 MG tablet Take 50 mg by mouth every 6 (six) hours as needed for moderate pain.     Marland Kitchen atorvastatin (LIPITOR) 20 MG tablet Take 1 tablet (20 mg total) by mouth daily. 90 tablet 3  . losartan (COZAAR) 50 MG tablet Take 1 tablet (50 mg total) by mouth daily. 90 tablet 3   No current facility-administered medications for this visit.    Allergies:   Celebrex [celecoxib], Mobic [meloxicam], and Adhesive [tape]    Social History:  The patient  reports that she quit smoking about 11 years ago. Her smoking use included cigarettes. She has a 40.00 pack-year smoking history. She has never used smokeless tobacco. She reports that she does not drink alcohol and does not use drugs.   Family History:  The patient's family history includes Heart failure in her father; Lung cancer in her mother.    ROS:  Please see the history of present illness.   Otherwise, review of systems are positive for none.   All other systems are reviewed and negative.    PHYSICAL EXAM: VS:  BP (!) 142/60 (BP Location: Left Arm, Patient Position: Sitting, Cuff Size: Normal)   Pulse 82   Ht 5' (1.524 m)   Wt 139 lb 4 oz (63.2 kg)   SpO2 95%   BMI 27.20 kg/m  , BMI Body mass index is 27.2 kg/m. GEN: Well nourished, well developed, in no acute  distress  HEENT: normal  Neck: no JVD, carotid bruits, or masses Cardiac: RRR; no murmurs, rubs, or gallops,no edema  Respiratory: Diminished breath sounds bilaterally GI: soft, nontender, nondistended, + BS MS: no deformity or atrophy  Skin: warm and dry, no rash Neuro:  Strength and sensation are intact Psych: euthymic mood, full affect Vascular: Femoral pulses mildly diminished on the right side.  Distal pulses are diminished.   EKG:  EKG is ordered today. The ekg ordered today demonstrates normal sinus rhythm with low voltage   Recent Labs: No results found for requested labs within  last 8760 hours.    Lipid Panel    Component Value Date/Time   CHOL 187 01/13/2019 1238   TRIG 84 01/13/2019 1238   HDL 69 01/13/2019 1238   CHOLHDL 2.7 01/13/2019 1238   VLDL 17 01/13/2019 1238   LDLCALC 101 (H) 01/13/2019 1238      Wt Readings from Last 3 Encounters:  04/21/20 139 lb 4 oz (63.2 kg)  03/01/20 139 lb 3.2 oz (63.1 kg)  10/22/19 137 lb 3.2 oz (62.2 kg)       No flowsheet data found.    ASSESSMENT AND PLAN:  1.  Chronic diastolic heart failure: She appears to be euvolemic on small dose furosemide.    2.  COPD: Followed by pulmonary.  3.  Essential hypertension: Blood pressure  has been mildly elevated lately.  I elected to increase losartan to 50 mg daily.  I reviewed most recent labs with her primary care physician which were unremarkable.  4.  Hyperlipidemia: Currently on pravastatin.  Most recent lipid profile showed an LDL of 90.  Given peripheral arterial disease, I think we need to be more aggressive with this and thus I elected to switch her to atorvastatin 20 mg daily.  5. Peripheral arterial disease: Noninvasive testing did confirm the presence of peripheral arterial disease but she has minimal claudication at the present time. Recommend continuing medical therapy.   Disposition:   FU with me in 6 months  Signed,  Kathlyn Sacramento, MD  04/21/2020  12:59 PM    [place]

## 2020-04-21 NOTE — Patient Instructions (Signed)
Medication Instructions:  Your physician has recommended you make the following change in your medication:   INCREASE Losartan to 50 mg daily. An Rx has been sent to your pharmacy.  STOP Pravastatin.  START Atorvastatin (Lipitor) 20 mg daily. An Rx has been sent to your pharmacy.   *If you need a refill on your cardiac medications before your next appointment, please call your pharmacy*   Lab Work: None ordered If you have labs (blood work) drawn today and your tests are completely normal, you will receive your results only by: Marland Kitchen MyChart Message (if you have MyChart) OR . A paper copy in the mail If you have any lab test that is abnormal or we need to change your treatment, we will call you to review the results.   Testing/Procedures: None ordered   Follow-Up: At [facility], you and your health needs are our priority.  As part of our continuing mission to provide you with exceptional heart care, we have created designated Provider Care Teams.  These Care Teams include your primary Cardiologist (physician) and Advanced Practice Providers (APPs -  Physician Assistants and Nurse Practitioners) who all work together to provide you with the care you need, when you need it.  We recommend signing up for the patient portal called "MyChart".  Sign up information is provided on this After Visit Summary.  MyChart is used to connect with patients for Virtual Visits (Telemedicine).  Patients are able to view lab/test results, encounter notes, upcoming appointments, etc.  Non-urgent messages can be sent to your provider as well.   To learn more about what you can do with MyChart, go to [email or web address].    Your next appointment:   6 month(s)  The format for your next appointment:   In Person  Provider:   You may see Kathlyn Sacramento, MD or one of the following Advanced Practice Providers on your designated Care Team:    Murray Hodgkins, NP  Christell Faith, PA-C  Marrianne Mood,  PA-C  Cadence Waterford, [place]  Laurann Montana, NP    Other Instructions N/A

## 2020-07-13 ENCOUNTER — Other Ambulatory Visit: Payer: Self-pay | Admitting: Pulmonary Disease

## 2020-08-17 ENCOUNTER — Encounter: Payer: Self-pay | Admitting: Pulmonary Disease

## 2020-08-30 ENCOUNTER — Ambulatory Visit (INDEPENDENT_AMBULATORY_CARE_PROVIDER_SITE_OTHER): Payer: Medicare Other | Admitting: Pulmonary Disease

## 2020-08-30 ENCOUNTER — Encounter: Payer: Self-pay | Admitting: Pulmonary Disease

## 2020-08-30 ENCOUNTER — Other Ambulatory Visit: Payer: Self-pay

## 2020-08-30 VITALS — BP 130/72 | HR 85 | Temp 97.7°F | Ht <= 58 in | Wt 144.6 lb

## 2020-08-30 DIAGNOSIS — J449 Chronic obstructive pulmonary disease, unspecified: Secondary | ICD-10-CM | POA: Diagnosis not present

## 2020-08-30 DIAGNOSIS — G4736 Sleep related hypoventilation in conditions classified elsewhere: Secondary | ICD-10-CM | POA: Diagnosis not present

## 2020-08-30 DIAGNOSIS — I2723 Pulmonary hypertension due to lung diseases and hypoxia: Secondary | ICD-10-CM | POA: Diagnosis not present

## 2020-08-30 DIAGNOSIS — I2729 Other secondary pulmonary hypertension: Secondary | ICD-10-CM | POA: Diagnosis not present

## 2020-08-30 NOTE — Progress Notes (Signed)
Subjective:    Patient ID: Carolyn Steele Reason, female    DOB: [birth year], 75 y.o.   MRN: [id number] Requesting MD/Service: Benita Stabile MD Date of initial consultation: 21 August 2019 Reason for consultation: Shortness of breath   Chief Complaint  Patient presents with  . Follow-up    Breathing okay, allergies are bad but feels fine otherwise.    PT PROFILE: 75 year old former smoker (quit 2010, 40-pack-year history) with shortness of breath and known history of pulmonary hypertension and diastolic dysfunction.  Evaluated for shortness of breath due to severe COPD.   DATA: 01/10/2019 CT angio chest: No PE, bilateral pleural effusions, small, right lung compressive atelectasis, cardiomegaly, centrilobular emphysema.  There is also moderate hiatal hernia 08/28/2019 overnight oximetry: Desaturations to as low as 83%, patient instructed to use 2 L/min nocturnally 09/01/2019 PFTs: FEV1 0.75 L or 40% predicted FEV1/FVC 46%, FVC 1.63 L or 66% predicted.  Postbronchodilator patient had 34% change in FEV1 and 27% change in FVC indicating asthmatic component.  Diffusion capacity moderately reduced.  Consistent with severe COPD with asthmatic component 09/01/2019 2D echo: LVEF is 55 to 60%, mild LVH, mild elevation of the pulmonary artery systolic pressure not determined, tricuspid regurgitation mild to moderate  INTERVAL: She was last seen here 03/01/2020 at that time appeared to be well compensated from a COPD standpoint.  He is on nocturnal oxygen at 2 L/min.  HPI Patient is a 75 year old former smoker with a past 40-pack-year history of smoking who presents for follow-up on the issue of COPD with asthma overlap and pulmonary hypertension due to diastolic dysfunction and COPD.  This is a scheduled visit.  Patient presents today with no overt complaint.  She has been compliant with nocturnal oxygen.  She notes good quality of sleep and wakes up refreshed in the mornings.  Notices also less dyspnea  during the day.  She has only had some minor issues with postnasal drip.  But other than that has been doing well.  She uses Allegra-D and Flonase to control allergy symptoms.  Does not note increased dyspnea.  No cough, sputum production or hemoptysis.  No chest pain, orthopnea or paroxysmal nocturnal dyspnea.  No lower extremity edema, no calf tenderness.  Overall she feels well and looks well.   Review of Systems A 10 point review of systems was performed and it is as noted above otherwise negative.  Patient Active Problem List   Diagnosis Date Noted  . Pulmonary hypertension due to COPD ([place]) 08/30/2020  . 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 COPD with asthma ([place]) 01/12/2020  . Acute on chronic diastolic heart failure ([place])   . CHF (congestive heart failure) ([place]) 01/10/2019   Social History   Tobacco Use  . Smoking status: Former Smoker    Packs/day: 1.00    Years: 40.00    Pack years: 40.00    Types: Cigarettes    Quit date: 2010    Years since quitting: 12.3  . Smokeless tobacco: Never Used  . Tobacco comment: quit smoking approximately 10-12 years ago (05/28/2015)  Substance Use Topics  . Alcohol use: No   Allergies  Allergen Reactions  . Celebrex [Celecoxib] Swelling  . Mobic [Meloxicam] Swelling  . Adhesive [Tape] Itching and Rash   Current Meds  Medication Sig  . albuterol (VENTOLIN HFA) 108 (90 Base) MCG/ACT inhaler USE 2 INHALATIONS BY MOUTH  EVERY 6 HOURS AS NEEDED FOR WHEEZING OR SHORTNESS OF  BREATH  . alendronate (FOSAMAX)  70 MG tablet Take 70 mg by mouth once a week. Take with a full glass of water on an empty stomach. Every Sunday  . aspirin 81 MG tablet Take 81 mg by mouth daily.  . Calcium Carb-Cholecalciferol (CALCIUM 600 + D PO) Take 1 tablet by mouth daily.  . Cranberry-Vitamin C 140-100 MG CAPS Take 2 capsules by mouth daily.   . Cyanocobalamin (VITAMIN B 12 PO) Take 1,000 mg by mouth daily.  . fexofenadine (ALLEGRA)  180 MG tablet Take 180 mg by mouth daily.  . Fluticasone-Umeclidin-Vilant (TRELEGY ELLIPTA) 100-62.5-25 MCG/INH AEPB Inhale 1 puff into the lungs daily.  . furosemide (LASIX) 20 MG tablet Take 1 tablet (20 mg total) by mouth daily.  Marland Kitchen gabapentin (NEURONTIN) 300 MG capsule Take 300 mg by mouth daily.  . Glucosamine HCl 1500 MG TABS Take 1 tablet by mouth daily.  Marland Kitchen guaifenesin (HUMIBID E) 400 MG TABS tablet Take 400 mg by mouth daily.  Marland Kitchen levothyroxine (SYNTHROID, LEVOTHROID) 150 MCG tablet Take 150 mcg by mouth daily before breakfast. Take 1 tablet M, T, Thurs, Fri and Sat. Take 1/2 tablet Wednesday and Skip Sunday.  . losartan (COZAAR) 50 MG tablet Take 1 tablet (50 mg total) by mouth daily.  . Multiple Vitamins-Minerals (MULTIVITAMIN & MINERAL PO) Take 1 tablet by mouth daily.  . Multiple Vitamins-Minerals (PRESERVISION AREDS 2) CAPS in the morning and at bedtime.  . Omega-3 Fatty Acids (FISH OIL) 1000 MG CAPS Take 500 mg by mouth daily.   Marland Kitchen omeprazole (PRILOSEC) 40 MG capsule Take 40 mg by mouth daily.  . OXYGEN Inhale 2 L into the lungs daily.  . Teriparatide, Recombinant, 620 MCG/2.48ML SOPN Inject into the skin daily.  . traMADol (ULTRAM) 50 MG tablet Take 50 mg by mouth every 6 (six) hours as needed for moderate pain.    Immunization History  Administered Date(s) Administered  . Fluad Quad(high Dose 65+) 01/30/2019  . Influenza-Unspecified 02/16/2020  . Zoster Recombinat (Shingrix) 06/17/2018, 11/20/2018   We discussed Covid-19 precautions.  I reviewed the vaccine effectiveness and potential side effects in detail to include differences between mRNA vaccines and traditional vaccines (attenuated virus).  Discussion also offered of long-term effectiveness and safety profile which are unclear at this time.  Discussed current CDC guidance that all patients are recommended COVID-19 vaccinations -as long as they do not have allergy to components of the vaccine.    Objective:   Physical  Exam BP 130/72 (BP Location: Left Arm, Patient Position: Sitting, Cuff Size: Normal)   Pulse 85   Temp 97.7 F (36.5 C) (Temporal)   Ht 4' 9.68" (1.465 m)   Wt 144 lb 9.6 oz (65.6 kg)   SpO2 97%   BMI 30.56 kg/m   GENERAL: Frail-appearing woman, no acute distress, fully ambulatory.  Assist cane for assistance. HEAD: Normocephalic, atraumatic.  EYES: Pupils equal, round, reactive to light.  No scleral icterus.  MOUTH: Nose/mouth/throat not examined due to masking requirements for COVID 19. NECK: Supple. No thyromegaly. Trachea midline. No JVD.  No adenopathy. PULMONARY: Increased AP diameter.  Good air entry bilaterally.  Coarse breath sounds otherwise no adventitious sounds. CARDIOVASCULAR: S1 and S2. Regular rate and rhythm.  ABDOMEN: Benign. MUSCULOSKELETAL: Significant kyphosis.  No joint deformity, no clubbing, no edema.  NEUROLOGIC: No focal deficit, speech is fluent, gait without disturbance though does use cane for steadying. SKIN: Intact,warm,dry. PSYCH: Mood and behavior are normal.       Assessment & Plan:     ICD-10-CM  1. 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)  J44.9    Continue Trelegy Ellipta   Patient has been compliant Notes good control of her COPD symptoms with this  2. Pulmonary hypertension due to COPD ([place])  I27.23 ECHOCARDIOGRAM COMPLETE   J44.9    Continue nocturnal oxygen 2D echo prior to follow-up visit  3. 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 ECHOCARDIOGRAM COMPLETE    Standing Status:   Future    Standing Expiration Date:   03/02/2021    Scheduling Instructions:     5 months    Order Specific Question:   Where should this test be performed    Answer:   [facility]    Order Specific Question:   Please indicate who you request to read the nuc med / echo results.    Answer:   [facility]    Order Specific  Question:   Perflutren DEFINITY (image enhancing agent) should be administered unless hypersensitivity or allergy exist    Answer:   Administer Perflutren    Order Specific Question:   Reason for exam-Echo    Answer:   Pulmonary hypertension I27.2   Patient is doing well.  We will see her in follow-up in 6 months time she is to contact us prior to that time should any new difficulties arise.  Renold Don, MD [facility]   *This note was dictated using voice recognition software/Dragon.  Despite best efforts to proofread, errors can occur which can change the meaning.  Any change was purely unintentional.

## 2020-08-30 NOTE — Patient Instructions (Signed)
You are doing well.  Continue using oxygen at nighttime.  We will see him in follow-up in 6 months time, we will get an echocardiogram before your return appointment.  This will tell us about the pressure of the artery going from the heart to the lungs.  Please call us prior to return appointment should you have any new issues.

## 2020-09-26 ENCOUNTER — Other Ambulatory Visit: Payer: Self-pay | Admitting: Pulmonary Disease

## 2020-10-15 ENCOUNTER — Other Ambulatory Visit: Payer: Self-pay | Admitting: Internal Medicine

## 2020-10-15 DIAGNOSIS — Z1231 Encounter for screening mammogram for malignant neoplasm of breast: Secondary | ICD-10-CM

## 2020-11-24 ENCOUNTER — Ambulatory Visit
Admission: RE | Admit: 2020-11-24 | Discharge: 2020-11-24 | Disposition: A | Discharge status: Home / Self Care | Payer: Medicare Other | Source: Ambulatory Visit | Attending: Internal Medicine | Admitting: Internal Medicine

## 2020-11-24 ENCOUNTER — Other Ambulatory Visit: Payer: Self-pay

## 2020-11-24 DIAGNOSIS — Z1231 Encounter for screening mammogram for malignant neoplasm of breast: Secondary | ICD-10-CM | POA: Diagnosis not present

## 2021-02-01 ENCOUNTER — Other Ambulatory Visit: Payer: Self-pay

## 2021-02-01 ENCOUNTER — Ambulatory Visit (INDEPENDENT_AMBULATORY_CARE_PROVIDER_SITE_OTHER): Payer: Medicare Other

## 2021-02-01 DIAGNOSIS — J449 Chronic obstructive pulmonary disease, unspecified: Secondary | ICD-10-CM | POA: Diagnosis not present

## 2021-02-01 DIAGNOSIS — I2723 Pulmonary hypertension due to lung diseases and hypoxia: Secondary | ICD-10-CM | POA: Diagnosis not present

## 2021-02-01 LAB — ECHOCARDIOGRAM COMPLETE
AR max vel: 2.66 cm2
AV Area VTI: 2.61 cm2
AV Area mean vel: 2.63 cm2
AV Mean grad: 3 mmHg
AV Peak grad: 6.9 mmHg
Ao pk vel: 1.31 m/s
Area-P 1/2: 5.5 cm2
Calc EF: 58.6 %
S' Lateral: 2.6 cm
Single Plane A2C EF: 61.3 %
Single Plane A4C EF: 57.2 %

## 2021-02-10 ENCOUNTER — Other Ambulatory Visit: Payer: Self-pay

## 2021-02-10 ENCOUNTER — Ambulatory Visit: Payer: Medicare Other | Admitting: Nurse Practitioner

## 2021-02-10 ENCOUNTER — Encounter: Payer: Self-pay | Admitting: Nurse Practitioner

## 2021-02-10 ENCOUNTER — Ambulatory Visit: Payer: Medicare Other | Admitting: Cardiovascular Disease

## 2021-02-10 VITALS — BP 130/60 | HR 79 | Ht 60.0 in | Wt 154.2 lb

## 2021-02-10 DIAGNOSIS — E782 Mixed hyperlipidemia: Secondary | ICD-10-CM

## 2021-02-10 DIAGNOSIS — I739 Peripheral vascular disease, unspecified: Secondary | ICD-10-CM

## 2021-02-10 DIAGNOSIS — I10 Essential (primary) hypertension: Secondary | ICD-10-CM | POA: Diagnosis not present

## 2021-02-10 DIAGNOSIS — I5032 Chronic diastolic (congestive) heart failure: Secondary | ICD-10-CM

## 2021-02-10 DIAGNOSIS — J449 Chronic obstructive pulmonary disease, unspecified: Secondary | ICD-10-CM

## 2021-02-10 MED ORDER — LOSARTAN POTASSIUM 100 MG PO TABS: 100.0000 mg | ORAL_TABLET | Freq: Every day | ORAL | 3 refills | Status: DC

## 2021-02-10 MED ORDER — ATORVASTATIN CALCIUM 20 MG PO TABS: 20.0000 mg | ORAL_TABLET | Freq: Every day | ORAL | 3 refills | Status: DC

## 2021-02-10 NOTE — Patient Instructions (Signed)
Medication Instructions:  -Your physician has recommended you make the following change in your medication:   1) INCREASE losartan to 100 mg- take 1 tablet by mouth once daily   *If you need a refill on your cardiac medications before your next appointment, please call your pharmacy*   Lab Work: - none ordered  If you have labs (blood work) drawn today and your tests are completely normal, you will receive your results only by: [place] (if you have MyChart) OR A paper copy in the mail If you have any lab test that is abnormal or we need to change your treatment, we will call you to review the results.   Testing/Procedures: - none ordered   Follow-Up: At [facility], you and your health needs are our priority.  As part of our continuing mission to provide you with exceptional heart care, we have created designated Provider Care Teams.  These Care Teams include your primary Cardiologist (physician) and Advanced Practice Providers (APPs -  Physician Assistants and Nurse Practitioners) who all work together to provide you with the care you need, when you need it.  We recommend signing up for the patient portal called "MyChart".  Sign up information is provided on this After Visit Summary.  MyChart is used to connect with patients for Virtual Visits (Telemedicine).  Patients are able to view lab/test results, encounter notes, upcoming appointments, etc.  Non-urgent messages can be sent to your provider as well.   To learn more about what you can do with MyChart, go to [email or web address].    Your next appointment:   6 month(s)  The format for your next appointment:   In Person  Provider:   You may see Kathlyn Sacramento, MD or one of the following Advanced Practice Providers on your designated Care Team:   Murray Hodgkins, NP   Other Instructions N/a

## 2021-02-10 NOTE — Progress Notes (Signed)
Office Visit    Patient Name: Carolyn Steele Date of Encounter: 02/10/2021  Primary Care Provider:  Albina Billet, Steele Primary Cardiologist:  Carolyn Steele  Chief Complaint    75 year old female with a history of HFpEF, hypertension, hyperlipidemia, COPD on home O2, GERD, hypothyroidism, and depression, who presents for follow-up related to HFpEF.  Past Medical History    Past Medical History:  Diagnosis Date   (HFpEF) heart failure with preserved ejection fraction ([place])    a. 12/2018 Echo: EF 60-65%, pseudonormalization. Nl RV fxn. RVSP 51 mmHg. Mild MR/TR; b. 01/2021 Echo: EF 60-65%, no rwma, GrII DD. Nl RV size/fxn. RVSP 44.17mHg. Mild MR. Mild-mod TR. Mild AoV sclerosis w/o stenosis.   Acid reflux    Arthritis    COPD (chronic obstructive pulmonary disease) ([facility]    a. Home O2 started 12/2018.   Demand ischemia ([place]    a. 12/2018 mild hsTrop elevation in setting of diast chf/htn urgency-->MV: no ischemia/infarct. EF 55-65%.   Depression    Hypercholesteremia    Hypothyroidism    Wears glasses    Past Surgical History:  Procedure Laterality Date   CHOLECYSTECTOMY     COLONOSCOPY     FOOT SURGERY Right    HAND SURGERY Bilateral    SHOULDER SURGERY Left    SPINAL CORD STIMULATOR INSERTION N/A 06/04/2015   Procedure: LUMBAR SPINAL CORD STIMULATOR INSERTION;  Surgeon: Carolyn Hakim Steele;  [facility];  Service: Neurosurgery;  Laterality: N/A;    Allergies  Allergies  Allergen Reactions   Celebrex [Celecoxib] Swelling   Mobic [Meloxicam] Swelling   Adhesive [Tape] Itching and Rash    History of Present Illness    75year old female with the above past medical history including remote tobacco abuse, COPD, HFpEF, hypertension, hyperlipidemia, GERD, hypothyroidism, and depression.  [facility].  She was found to have vascular congestion, cardiomegaly, and small bilateral pleural  effusions on chest x-ray.  She was admitted for diuresis.  Echo showed normal LV function with grade 1 diastolic dysfunction.  She had mild troponin elevation underwent stress testing, which showed no evidence of ischemia or infarct.  She continued require oxygen therapy following discharge.  Follow-up echocardiogram in May 2021, again showed normal LV function (55-60%) with mild LVH.  Pulmonary hypertension was present with an RVSP of 45 mmHg.  She was seen by pulmonology underwent PFTs, which showed severe COPD with an asthma component that improved with inhalers.  [facility].  She did require chest tube for 5 days.  She has since been followed closely by pulmonology and underwent repeat echo earlier this month, showing an EF of 60 to 65% with grade 2 diastolic dysfunction and RVSP of 44.6 mmHg.  Ms. BMarseillepreviously underwent ABIs in the setting of low back pain and diminished distal pulses.  These were abnormal with an ABI of 0.75 in the right and 0.95 on the left.  Arterial duplex shows significant bilateral right greater than left iliac disease.  In the setting of minimal claudication, medical therapy was recommended.  She was doing well at her last cardiology visit with Dr. AFletcher Anonin December 2021.  She is now only using oxygen at night.  Since her last visit, she notes that she has felt well.  She does have chronic, stable dyspnea on exertion but notes that if  she has something to lean on like a shopping cart, she can walk for long periods of time without dyspnea or limitations.  She denies chest pain, palpitations, PND, orthopnea, dizziness, syncope, edema, or early satiety.  She does have chronic low back pain dating back to a fall several years ago.  She denies any lower extremity claudication.  Home Medications    Current Outpatient Medications  Medication Sig Dispense Refill   albuterol (VENTOLIN HFA)  108 (90 Base) MCG/ACT inhaler USE 2 INHALATIONS BY MOUTH  EVERY 6 HOURS AS NEEDED FOR WHEEZING OR SHORTNESS OF  BREATH 34 g 3   aspirin 81 MG tablet Take 81 mg by mouth daily.     atorvastatin (LIPITOR) 20 MG tablet Take 1 tablet (20 mg total) by mouth daily. 90 tablet 3   Calcium Carb-Cholecalciferol (CALCIUM 600 + D PO) Take 1 tablet by mouth daily.     Cranberry-Vitamin C 140-100 MG CAPS Take 2 capsules by mouth daily.      Cyanocobalamin (VITAMIN B 12 PO) Take 1,000 mg by mouth daily.     fexofenadine (ALLEGRA) 180 MG tablet Take 180 mg by mouth daily.     furosemide (LASIX) 20 MG tablet Take 1 tablet (20 mg total) by mouth daily. 90 tablet 2   gabapentin (NEURONTIN) 300 MG capsule Take 300 mg by mouth daily.     Glucosamine HCl 1500 MG TABS Take 1 tablet by mouth daily.     guaifenesin (HUMIBID E) 400 MG TABS tablet Take 400 mg by mouth daily.     levothyroxine (SYNTHROID, LEVOTHROID) 150 MCG tablet Take 150 mcg by mouth daily before breakfast. Take 1 tablet M, T, Thurs, Fri and Sat. Take 1/2 tablet Wednesday and Skip Sunday.     losartan (COZAAR) 50 MG tablet Take 1 tablet (50 mg total) by mouth daily. 90 tablet 3   Multiple Vitamins-Minerals (MULTIVITAMIN & MINERAL PO) Take 1 tablet by mouth daily.     Multiple Vitamins-Minerals (PRESERVISION AREDS 2) CAPS in the morning and at bedtime.     Omega-3 Fatty Acids (FISH OIL) 1000 MG CAPS Take 500 mg by mouth daily.      omeprazole (PRILOSEC) 40 MG capsule Take 40 mg by mouth daily.     OXYGEN Inhale 2 L into the lungs daily.     Teriparatide, Recombinant, 620 MCG/2.48ML SOPN Inject into the skin daily.     traMADol (ULTRAM) 50 MG tablet Take 50 mg by mouth every 6 (six) hours as needed for moderate pain.      TRELEGY ELLIPTA 100-62.5-25 MCG/INH AEPB USE 1 INHALATION BY MOUTH  DAILY 180 each 3   alendronate (FOSAMAX) 70 MG tablet Take 70 mg by mouth once a week. Take with a full glass of water on an empty stomach. Every Sunday (Patient not  taking: Reported on 02/10/2021)     No current facility-administered medications for this visit.     Review of Systems    Chronic, stable dyspnea on exertion.  Chronic low back pain.  She denies chest pain, palpitations, PND, orthopnea, dizziness, syncope, edema, claudication, or early satiety.  All other systems reviewed and are otherwise negative except as noted above.  Physical Exam    VS:  BP 130/60 (BP Location: Left Arm, Patient Position: Sitting, Cuff Size: Normal)   Pulse 79   Ht 5' (1.524 m)   Wt 154 lb 4 oz (70 kg)   SpO2 96%   BMI 30.12 kg/m  , BMI Body mass  index is 30.12 kg/m.     GEN: Well nourished, well developed, in no acute distress. HEENT: normal. Neck: Supple, no JVD, carotid bruits, or masses. Cardiac: RRR, distant, no murmurs, rubs, or gallops. No clubbing, cyanosis, edema.  Radials 2+ bilaterally.  PT diminished bilaterally. Respiratory:  Respirations regular and unlabored, diminished breath sounds bilaterally. GI: Soft, nontender, nondistended, BS + x 4. MS: no deformity or atrophy. Skin: warm and dry, no rash. Neuro:  Strength and sensation are intact. Psych: Normal affect.  Accessory Clinical Findings    ECG personally reviewed by me today -regular sinus rhythm, 79- no acute changes.  Lab Results  Component Value Date   WBC 8.8 01/13/2019   HGB 11.8 (L) 01/13/2019   HCT 41.3 01/13/2019   MCV 79.4 (L) 01/13/2019   PLT 351 01/13/2019   Lab Results  Component Value Date   CREATININE 0.57 02/06/2019   BUN 12 02/06/2019   NA 137 02/06/2019   K 4.3 02/06/2019   CL 96 (L) 02/06/2019   CO2 34 (H) 02/06/2019   Lab Results  Component Value Date   ALT 246 (H) 01/11/2019   AST 260 (H) 01/11/2019   ALKPHOS 71 01/11/2019   BILITOT 0.8 01/11/2019   Lab Results  Component Value Date   CHOL 187 01/13/2019   HDL 69 01/13/2019   LDLCALC 101 (H) 01/13/2019   TRIG 84 01/13/2019   CHOLHDL 2.7 01/13/2019     Assessment & Plan    1.  Chronic  heart failure with preserved ejection fraction: Echo earlier this month with an EF of 60 to 65% and some progression of diastolic dysfunction-now grade 2.  RVSP was stable at 44.6 mmHg.  Mild MR as well as mild-moderate TR were noted.  She has chronic, stable dyspnea on exertion and does not experience edema.  Her weight is up over the past year, roughly 15 pounds, though she is euvolemic on examination.  Blood pressure is mildly elevated today at 130/60.  Given progression of diastolic dysfunction and mild blood pressure elevation, I am going to increase her losartan to 100 mg daily.  She has follow-up with primary care within the next 2 weeks for annual labs.  Encouraged more regular activity and caloric restriction with a goal of weight loss.  2.  COPD: No longer requires oxygen during daytime hours, and is only using at night.  Chronic, stable dyspnea on exertion.  Followed closely by pulmonology.  3.  Essential hypertension: Blood pressure mildly elevated today at 130/80.  As above, in the setting of progressive diastolic dysfunction, I am increasing losartan to 100 mg daily.  She will have follow-up labs with primary care in the next couple of weeks.  4.  Hyperlipidemia: Tolerating atorvastatin which requires refill today.  She is due for fasting labs with primary care in the next 2 weeks.  5.  Peripheral arterial disease: Previous noninvasive testing confirmed presence of peripheral arterial disease-right greater than left, though she does not experience claudication.  She does have low back pain dating back to a fall several years ago, which limits ambulation.  She remains on aspirin and statin therapy.  6.  Disposition: Patient to have follow-up labs with primary care in approximately 2 weeks.  Follow-up in clinic in 6 months or sooner if necessary.   Murray Hodgkins, NP 02/10/2021, 8:47 AM

## 2021-03-30 ENCOUNTER — Encounter: Payer: Self-pay | Admitting: Pulmonary Disease

## 2021-03-30 ENCOUNTER — Ambulatory Visit: Payer: Medicare Other | Admitting: Pulmonary Disease

## 2021-03-30 ENCOUNTER — Other Ambulatory Visit: Payer: Self-pay

## 2021-03-30 VITALS — BP 126/70 | HR 83 | Temp 97.5°F | Ht 60.0 in | Wt 153.6 lb

## 2021-03-30 DIAGNOSIS — I2729 Other secondary pulmonary hypertension: Secondary | ICD-10-CM | POA: Diagnosis not present

## 2021-03-30 DIAGNOSIS — J449 Chronic obstructive pulmonary disease, unspecified: Secondary | ICD-10-CM

## 2021-03-30 DIAGNOSIS — I2723 Pulmonary hypertension due to lung diseases and hypoxia: Secondary | ICD-10-CM

## 2021-03-30 DIAGNOSIS — I5032 Chronic diastolic (congestive) heart failure: Secondary | ICD-10-CM

## 2021-03-30 DIAGNOSIS — G4736 Sleep related hypoventilation in conditions classified elsewhere: Secondary | ICD-10-CM

## 2021-03-30 NOTE — Patient Instructions (Signed)
Continue using Trelegy.  You are doing well.  Continue using oxygen at nighttime.  We will see him in follow-up in 6 months time we will get a breathing test scheduled closer to your return visit time.

## 2021-03-30 NOTE — Progress Notes (Signed)
Pft

## 2021-03-30 NOTE — Progress Notes (Addendum)
Subjective:    Patient ID: Carolyn Steele, female    DOB: [birth year], 75 y.o.   MRN: [id number] Chief Complaint  Patient presents with   Follow-up    Asthma copd- pt is doing well.    Requesting MD/Service: Benita Stabile MD Date of initial consultation: 21 August 2019 Steele for consultation: Shortness of breath PT PROFILE: 75 year old former smoker (quit 2010, 40-pack-year history) with shortness of breath and known history of pulmonary hypertension and diastolic dysfunction.  Evaluated for shortness of breath due to severe COPD.   DATA: 01/10/2019 CT angio chest: No PE, bilateral pleural effusions, small, right lung compressive atelectasis, cardiomegaly, centrilobular emphysema.  There is also moderate hiatal hernia 08/28/2019 overnight oximetry: Desaturations to as low as 83%, patient instructed to use 2 L/min nocturnally 09/01/2019 PFTs: FEV1 0.75 L or 40% predicted FEV1/FVC 46%, FVC 1.63 L or 66% predicted.  Postbronchodilator patient had 34% change in FEV1 and 27% change in FVC indicating asthmatic component.  Diffusion capacity moderately reduced.  Consistent with severe COPD with asthmatic component 09/01/2019 2D echo: LVEF is 55 to 60%, mild LVH, mild elevation of the pulmonary artery systolic pressure not determined, tricuspid regurgitation mild to moderate 02/01/2021 echocardiogram: LVEF 60 to 65%, Grade II DD, mild elevation in pulmonary systolic pressure, RV systolic pressure 63.3 mmHg.   INTERVAL: She was last seen here 08/30/2020 at that time appeared to be well compensated from a COPD standpoint.  She is on nocturnal oxygen at 2 L/min.   HPI Patient is a 75 year old former smoker with a past 40-pack-year history of smoking who presents for follow-up on the issue of COPD with asthma overlap and pulmonary hypertension due to diastolic dysfunction and COPD.  This is a scheduled visit.  Patient presents today with no overt complaint.  She has been compliant with nocturnal  oxygen.  She notes good quality of sleep and wakes up refreshed in the mornings.  Notices also less dyspnea during the day.  She has been compliant with Trelegy which continues to help her with her COPD/asthma symptoms.  No major flares of asthma and or allergies lately.She uses Allegra-D and Flonase to control allergy symptoms.  Does not note increased dyspnea.  No cough, sputum production or hemoptysis. No chest pain, orthopnea or paroxysmal nocturnal dyspnea.  No lower extremity edema, no calf tenderness.   She has been started recently on injectable medication for osteoporosis, she has had her blood pressure medications adjusted by cardiology in view of her Grade II DD.  Overall she feels well and looks well.   Review of Systems A 10 point review of systems was performed and it is as noted above otherwise negative.  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 ([place]) 08/30/2020   COPD with asthma ([place]) 01/12/2020   Acute on chronic diastolic heart failure ([facility])    CHF (congestive heart failure) ([place]) 01/10/2019   Social History   Tobacco Use   Smoking status: Former    Packs/day: 1.00    Years: 40.00    Pack years: 40.00    Types: Cigarettes    Quit date: 2010    Years since quitting: 12.9   Smokeless tobacco: Never   Tobacco comments:    quit smoking approximately 10-12 years ago (05/28/2015)  Substance Use Topics   Alcohol use: No   Allergies  Allergen Reactions   Celebrex [Celecoxib] Swelling  Mobic [Meloxicam] Swelling   Adhesive [Tape] Itching and Rash   Current Meds  Medication Sig   albuterol (VENTOLIN HFA) 108 (90 Base) MCG/ACT inhaler USE 2 INHALATIONS BY MOUTH  EVERY 6 HOURS AS NEEDED FOR WHEEZING OR SHORTNESS OF  BREATH   alendronate (FOSAMAX) 70 MG tablet Take 70 mg by mouth once a week. Take with a full glass of water on an empty stomach. Every  Sunday   aspirin 81 MG tablet Take 81 mg by mouth daily.   atorvastatin (LIPITOR) 20 MG tablet Take 1 tablet (20 mg total) by mouth daily.   Calcium Carb-Cholecalciferol (CALCIUM 600 + D PO) Take 1 tablet by mouth daily.   Cranberry-Vitamin C 140-100 MG CAPS Take 2 capsules by mouth daily.    Cyanocobalamin (VITAMIN B 12 PO) Take 1,000 mg by mouth daily.   fexofenadine (ALLEGRA) 180 MG tablet Take 180 mg by mouth daily.   furosemide (LASIX) 20 MG tablet Take 1 tablet (20 mg total) by mouth daily.   gabapentin (NEURONTIN) 300 MG capsule Take 300 mg by mouth daily.   Glucosamine HCl 1500 MG TABS Take 1 tablet by mouth daily.   guaifenesin (HUMIBID E) 400 MG TABS tablet Take 400 mg by mouth daily.   levothyroxine (SYNTHROID, LEVOTHROID) 150 MCG tablet Take 150 mcg by mouth daily before breakfast. Take 1 tablet M, T, Thurs, Fri and Sat. Take 1/2 tablet Wednesday and Skip Sunday.   losartan (COZAAR) 100 MG tablet Take 1 tablet (100 mg total) by mouth daily.   Multiple Vitamins-Minerals (MULTIVITAMIN & MINERAL PO) Take 1 tablet by mouth daily.   Multiple Vitamins-Minerals (PRESERVISION AREDS 2) CAPS in the morning and at bedtime.   Omega-3 Fatty Acids (FISH OIL) 1000 MG CAPS Take 500 mg by mouth daily.    omeprazole (PRILOSEC) 40 MG capsule Take 40 mg by mouth daily.   OXYGEN Inhale 2 L into the lungs daily.   Teriparatide, Recombinant, 620 MCG/2.48ML SOPN Inject into the skin daily.   traMADol (ULTRAM) 50 MG tablet Take 50 mg by mouth every 6 (six) hours as needed for moderate pain.    TRELEGY ELLIPTA 100-62.5-25 MCG/INH AEPB USE 1 INHALATION BY MOUTH  DAILY   Immunization History  Administered Date(s) Administered   Fluad Quad(high Dose 65+) 01/30/2019   Influenza-Unspecified 02/16/2020   Zoster Recombinat (Shingrix) 06/17/2018, 11/20/2018   Patient declines flu vaccine.  We discussed Covid-19 precautions.  I reviewed the vaccine effectiveness and potential side effects in detail to include  differences between mRNA vaccines and traditional vaccines (attenuated virus).  Discussion also offered of long-term effectiveness and safety profile which are unclear at this time.  Discussed current CDC guidance that all patients are recommended COVID-19 vaccinations-as long as they do not have allergy to components of the vaccine.    Objective:   Physical Exam BP 126/70 (BP Location: Left Arm, Patient Position: Sitting, Cuff Size: Normal)   Pulse 83   Temp (!) 97.5 F (36.4 C) (Oral)   Ht 5' (1.524 m)   Wt 153 lb 9.6 oz (69.7 kg)   SpO2 95%   BMI 30.00 kg/m   GENERAL: Frail-appearing woman, no acute distress, fully ambulatory.  Uses cane for ambulating assistance. HEAD: Normocephalic, atraumatic.  EYES: Pupils equal, round, reactive to light.  No scleral icterus.  MOUTH: Nose/mouth/throat not examined due to masking requirements for COVID 19. NECK: Supple. No thyromegaly. Trachea midline. No JVD.  No adenopathy. PULMONARY: Increased AP diameter.  Good air entry bilaterally.  Coarse breath sounds, otherwise, no adventitious sounds. CARDIOVASCULAR: S1 and S2. Regular rate and rhythm.  ABDOMEN: Benign. MUSCULOSKELETAL: Significant kyphosis.  No joint deformity, no clubbing, no edema.  NEUROLOGIC: No focal deficit, speech is fluent, gait without disturbance though does use cane for steadying. SKIN: Intact,warm,dry. PSYCH: Mood and behavior are normal.     Assessment & Plan:     ICD-10-CM   1. COPD with asthma ([place])  J44.9 Pulmonary Function Test [facility] Only   Well-controlled on Trelegy Previously noted to be severe but with very significant BD response Continue Trelegy, PFTs on return Return visit 6 months    2. Pulmonary hypertension due to COPD [facility])  I27.23    J44.9    Very mild Multifactorial Diastolic dysfunction/COPD Continue nocturnal O2    3. 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 Chronic diastolic heart failure ([place])  I50.32    Managed by cardiology Had medications revised by cardiology recently     Orders Placed This Encounter  Procedures   Pulmonary Function Test [facility] Only    Standing Status:   Future    Standing Expiration Date:   03/30/2022    Scheduling Instructions:     In 5 months    Order Specific Question:   Full PFT: includes the following: basic spirometry, spirometry pre & post bronchodilator, diffusion capacity (DLCO), lung volumes    Answer:   Full PFT    Order Specific Question:   This test can only be performed at    Answer:   [facility]   Patient appears to be doing well.  Continue therapy as above.  Follow-up will be in 6 months time she is to contact us prior to that time should any problems arise.  Renold Don, MD Advanced Bronchoscopy PCCM [facility]    *This note was dictated using voice recognition software/Dragon.  Despite best efforts to proofread, errors can occur which can change the meaning.  Any change was purely unintentional.

## 2021-08-25 ENCOUNTER — Encounter: Payer: Self-pay | Admitting: Cardiovascular Disease

## 2021-08-25 ENCOUNTER — Ambulatory Visit: Payer: Medicare Other | Admitting: Cardiovascular Disease

## 2021-08-25 VITALS — BP 104/60 | HR 72 | Ht 60.0 in | Wt 158.1 lb

## 2021-08-25 DIAGNOSIS — I10 Essential (primary) hypertension: Secondary | ICD-10-CM

## 2021-08-25 DIAGNOSIS — I739 Peripheral vascular disease, unspecified: Secondary | ICD-10-CM

## 2021-08-25 DIAGNOSIS — J449 Chronic obstructive pulmonary disease, unspecified: Secondary | ICD-10-CM

## 2021-08-25 DIAGNOSIS — I5032 Chronic diastolic (congestive) heart failure: Secondary | ICD-10-CM

## 2021-08-25 DIAGNOSIS — E785 Hyperlipidemia, unspecified: Secondary | ICD-10-CM | POA: Diagnosis not present

## 2021-08-25 NOTE — Progress Notes (Signed)
?  ?Cardiology Office Note ? ? ?Date:  08/25/2021  ? ?ID:  Carolyn Steele, DOB 1946/03/10, MRN [id number] ? ?PCP:  Albina Billet, MD  ?Cardiologist:   Kathlyn Sacramento, MD  ? ?Chief Complaint  ?Patient presents with  ? Other  ?  6 month f/u no complaints today. Meds reviewed verbally with pt.  ? ? ?  ?History of Present Illness: ?Carolyn Steele is a 76 y.o. female who presents for a follow-up visit regarding chronic diastolic heart failure. ?She has chronic medical conditions including essential hypertension, hyperlipidemia, COPD on home oxygen, GERD, hypothyroidism and depression. ?She was admitted to [facility] in September of 2020 with heart failure in the setting of uncontrolled hypertension.  Echo showed normal LV systolic function with evidence of diastolic dysfunction.  Troponin was mildly elevated thought to be due to supply demand ischemia.  Stress test showed no evidence of ischemia or infarct. ?She has chronic low back pain.  She has known severe COPD based on pulmonary function testing.  However, she has to use oxygen only at night. ?Echocardiogram in May 2021 showed normal LV systolic function, normal diastolic function, mild to moderate tricuspid regurgitation with mild pulmonary hypertension. Estimated peak systolic pulmonary pressure was 45 mmHg. ?She had noninvasive vascular testing done in May 2021 which showed an ABI of 0.75 on the right and 0.95 on the left. Duplex showed significant iliac disease worse on the right side. She denies leg claudication. ?She was hospitalized in September 2021 at [facility] with a large pneumothorax after she fractured her ribs from a fall.  She required a chest tube . ? ?She was most recently seen in our office in October and at that time her blood pressure was elevated.  Losartan was increased.  She had an echocardiogram done in October which showed normal LV systolic function, grade 2 diastolic dysfunction, mild to moderate pulmonary hypertension, mild mitral regurgitation  and mild to moderate tricuspid regurgitation. ? ?She has been doing very well with no chest pain, dyspnea or lower extremity edema.  She denies leg claudication.  She is limited by chronic back pain which has been present since she fell 10 years ago.  She has a back stimulator. ? ?Past Medical History:  ?Diagnosis Date  ? (HFpEF) heart failure with preserved ejection fraction ([place])   ? a. 12/2018 Echo: EF 60-65%, pseudonormalization. Nl RV fxn. RVSP 51 mmHg. Mild MR/TR; b. 01/2021 Echo: EF 60-65%, no rwma, GrII DD. Nl RV size/fxn. RVSP 44.81mHg. Mild MR. Mild-mod TR. Mild AoV sclerosis w/o stenosis.  ? Acid reflux   ? Arthritis   ? COPD (chronic obstructive pulmonary disease) ([place]   ? a. Home O2 started 12/2018.  ? Demand ischemia ([place]   ? a. 12/2018 mild hsTrop elevation in setting of diast chf/htn urgency-->MV: no ischemia/infarct. EF 55-65%.  ? Depression   ? Hypercholesteremia   ? Hypothyroidism   ? Wears glasses   ? ? ?Past Surgical History:  ?Procedure Laterality Date  ? CHOLECYSTECTOMY    ? COLONOSCOPY    ? FOOT SURGERY Right   ? HAND SURGERY Bilateral   ? SHOULDER SURGERY Left   ? SPINAL CORD STIMULATOR INSERTION N/A 06/04/2015  ? Procedure: LUMBAR SPINAL CORD STIMULATOR INSERTION;  Surgeon: PClydell Hakim MD;  [facility];  Service: Neurosurgery;  Laterality: N/A;  ? ? ? ?Current Outpatient Medications  ?Medication Sig Dispense Refill  ? albuterol (VENTOLIN HFA) 108 (90 Base) MCG/ACT inhaler USE 2 INHALATIONS BY MOUTH  EVERY 6 HOURS AS NEEDED FOR WHEEZING OR SHORTNESS OF  BREATH 34 g 3  ? alendronate (FOSAMAX) 70 MG tablet Take 70 mg by mouth once a week. Take with a full glass of water on an empty stomach. Every Sunday    ? aspirin 81 MG tablet Take 81 mg by mouth daily.    ? atorvastatin (LIPITOR) 20 MG tablet Take 1 tablet (20 mg total) by mouth daily. 90 tablet 3  ? Calcium Carb-Cholecalciferol (CALCIUM 600 + D PO) Take 1 tablet by mouth daily.    ? Cranberry-Vitamin C 140-100 MG CAPS Take 2  capsules by mouth daily.     ? Cyanocobalamin (VITAMIN B 12 PO) Take 1,000 mg by mouth daily.    ? fexofenadine (ALLEGRA) 180 MG tablet Take 180 mg by mouth daily.    ? furosemide (LASIX) 20 MG tablet Take 1 tablet (20 mg total) by mouth daily. 90 tablet 2  ? gabapentin (NEURONTIN) 300 MG capsule Take 300 mg by mouth daily.    ? Glucosamine HCl 1500 MG TABS Take 1 tablet by mouth daily.    ? guaifenesin (HUMIBID E) 400 MG TABS tablet Take 400 mg by mouth daily.    ? levothyroxine (SYNTHROID, LEVOTHROID) 150 MCG tablet Take 150 mcg by mouth daily before breakfast. Take 1 tablet M, T, Thurs, Fri and Sat. Take 1/2 tablet Wednesday and Skip Sunday.    ? losartan (COZAAR) 100 MG tablet Take 1 tablet (100 mg total) by mouth daily. 90 tablet 3  ? Multiple Vitamins-Minerals (MULTIVITAMIN & MINERAL PO) Take 1 tablet by mouth daily.    ? Multiple Vitamins-Minerals (PRESERVISION AREDS 2) CAPS in the morning and at bedtime.    ? Omega-3 Fatty Acids (FISH OIL) 1000 MG CAPS Take 500 mg by mouth daily.     ? omeprazole (PRILOSEC) 40 MG capsule Take 40 mg by mouth daily.    ? OXYGEN Inhale 2 L into the lungs daily.    ? Teriparatide, Recombinant, 620 MCG/2.48ML SOPN Inject into the skin daily.    ? traMADol (ULTRAM) 50 MG tablet Take 50 mg by mouth every 6 (six) hours as needed for moderate pain.     ? TRELEGY ELLIPTA 100-62.5-25 MCG/INH AEPB USE 1 INHALATION BY MOUTH  DAILY 180 each 3  ? ?No current facility-administered medications for this visit.  ? ? ?Allergies:   Celebrex [celecoxib], Mobic [meloxicam], and Adhesive [tape]  ? ? ?Social History:  The patient  reports that she quit smoking about 13 years ago. Her smoking use included cigarettes. She has a 40.00 pack-year smoking history. She has never used smokeless tobacco. She reports that she does not drink alcohol and does not use drugs.  ? ?Family History:  The patient's family history includes Heart failure in her father; Lung cancer in her mother.  ? ? ?ROS:  Please see  the history of present illness.   Otherwise, review of systems are positive for none.   All other systems are reviewed and negative.  ? ? ?PHYSICAL EXAM: ?VS:  BP 104/60 (BP Location: Left Arm, Patient Position: Sitting, Cuff Size: Normal)   Pulse 72   Ht 5' (1.524 m)   Wt 158 lb 2 oz (71.7 kg)   SpO2 93%   BMI 30.88 kg/m?  , BMI Body mass index is 30.88 kg/m?. ?GEN: Well nourished, well developed, in no acute distress  ?HEENT: normal  ?Neck: no JVD, carotid bruits, or masses ?Cardiac: RRR; no murmurs, rubs, or gallops,no  edema  ?Respiratory: Diminished breath sounds bilaterally ?GI: soft, nontender, nondistended, + BS ?MS: no deformity or atrophy  ?Skin: warm and dry, no rash ?Neuro:  Strength and sensation are intact ?Psych: euthymic mood, full affect ? ? ? ?EKG:  EKG is ordered today. ?The ekg ordered today demonstrates normal sinus rhythm with low voltage ? ? ?Recent Labs: ?No results found for requested labs within last 8760 hours.  ? ? ?Lipid Panel ?   ?Component Value Date/Time  ? CHOL 187 01/13/2019 1238  ? TRIG 84 01/13/2019 1238  ? HDL 69 01/13/2019 1238  ? CHOLHDL 2.7 01/13/2019 1238  ? VLDL 17 01/13/2019 1238  ? LDLCALC 101 (H) 01/13/2019 1238  ? ?  ? ?Wt Readings from Last 3 Encounters:  ?08/25/21 158 lb 2 oz (71.7 kg)  ?03/30/21 153 lb 9.6 oz (69.7 kg)  ?02/10/21 154 lb 4 oz (70 kg)  ?  ? ? ? ?   ? View : No data to display.  ?  ?  ?  ? ? ? ? ?ASSESSMENT AND PLAN: ? ?1.  Chronic diastolic heart failure: She appears to be euvolemic on small dose furosemide.  Blood pressure is well controlled. ? ?2.  COPD: Followed by pulmonary. ? ?3.  Essential hypertension: Blood pressure is not well controlled after increasing the dose of losartan to 100 mg once daily. ? ?4.  Hyperlipidemia: Continue atorvastatin 20 mg once daily with a target LDL of less than 70 given the presence of peripheral arterial disease. ? ?5. Peripheral arterial disease: Mildly reduced ABI with possible inflow disease.  Currently with  no claudication as she is mostly limited by chronic low back pain.  Recommend continuing medical therapy. ? ? ?Disposition:   FU with me in 6 months ? ?Signed, ? ?Kathlyn Sacramento, MD  ?08/25/2021 8:11 AM    ?Larence Penning

## 2021-08-25 NOTE — Patient Instructions (Signed)
Medication Instructions:  ?Your physician recommends that you continue on your current medications as directed. Please refer to the Current Medication list given to you today. ? ?*If you need a refill on your cardiac medications before your next appointment, please call your pharmacy* ? ? ?Lab Work: ?None ordered ?If you have labs (blood work) drawn today and your tests are completely normal, you will receive your results only by: ?MyChart Message (if you have MyChart) OR ?A paper copy in the mail ?If you have any lab test that is abnormal or we need to change your treatment, we will call you to review the results. ? ? ?Testing/Procedures: ?None ordered ? ? ?Follow-Up: ?At [facility], you and your health needs are our priority.  As part of our continuing mission to provide you with exceptional heart care, we have created designated Provider Care Teams.  These Care Teams include your primary Cardiologist (physician) and Advanced Practice Providers (APPs -  Physician Assistants and Nurse Practitioners) who all work together to provide you with the care you need, when you need it. ? ?We recommend signing up for the patient portal called "MyChart".  Sign up information is provided on this After Visit Summary.  MyChart is used to connect with patients for Virtual Visits (Telemedicine).  Patients are able to view lab/test results, encounter notes, upcoming appointments, etc.  Non-urgent messages can be sent to your provider as well.   ?To learn more about what you can do with MyChart, go to [email or web address].   ? ?Your next appointment:   ?Your physician wants you to follow-up in: 6 months You will receive a reminder letter in the mail two months in advance. If you don't receive a letter, please call our office to schedule the follow-up appointment. ? ? ?The format for your next appointment:   ?In Person ? ?Provider:   ?You may see Kathlyn Sacramento, MD or one of the following Advanced Practice Providers on your  designated Care Team:   ?Murray Hodgkins, NP ?Christell Faith, PA-C ?Cadence Kathlen Mody, PA-C ? ? ?Other Instructions ?N/A ? ?Important Information About Sugar ? ? ? ? ? ? ?

## 2021-08-26 NOTE — Addendum Note (Signed)
Addended by: Anselm Pancoast on: 08/26/2021 02:35 PM ? ? Modules accepted: Orders ? ?

## 2021-08-29 ENCOUNTER — Other Ambulatory Visit: Payer: Self-pay | Admitting: Pulmonary Disease

## 2021-09-05 ENCOUNTER — Other Ambulatory Visit: Payer: Self-pay | Admitting: Pulmonary Disease

## 2021-09-05 ENCOUNTER — Other Ambulatory Visit: Payer: Self-pay | Admitting: Nurse Practitioner

## 2021-09-13 ENCOUNTER — Other Ambulatory Visit: Payer: Self-pay | Admitting: General Surgery

## 2021-09-13 NOTE — Progress Notes (Signed)
Progress Notes ?- documented in this encounter ?Mayer Masker, MD - 09/13/2021 11:15 AM EDT ?Formatting of this note is different from the original. ?Subjective:  ? ?Patient ID: Carolyn Steele is a 76 y.o. female. ? ?HPI ? ?The following portions of the patient's history were reviewed and updated as appropriate. ? ?This is a return patient is here today for: office visit. The patient has been referred by Dr. Hall Busing to discuss a colonoscopy. Patient had a positive cologuard. The patient reports bowel movements 1-2 times per day. She denies any bleeding or mucus.  ? ?Since her last visit 10 years ago her son passed with esophageal cancer and her daughter is now living at home with significant lower extremity lymphedema requiring the compression. ? ?Chief Complaint  ?Patient presents with  ? Pre-op Exam  ? ? ?BP (!) 146/70  Pulse 87  Temp 36.8 ?C (98.3 ?F)  Wt 70.8 kg (156 lb)  SpO2 92%  ? ?Past Medical History:  ?Diagnosis Date  ? Acid reflux  ? Arthritis  ? Asthma  ? COPD (chronic obstructive pulmonary disease) (CMS-HCC)  ? Demand ischemia (CMS-HCC)  ? Depression  ? Heart failure with preserved ejection fraction (CMS-HCC) 12/2018  ? Hypercholesteremia  ? Hypertension  ? Hypothyroidism  ? Macular degeneration  ? Osteoporosis  ? ? ?Past Surgical History:  ?Procedure Laterality Date  ? spinal cord stimulator insertion 06/04/2015  ? CHOLECYSTECTOMY  ? COLONOSCOPY  ? foot surgery Right  ? hand surgery Bilateral  ? shoulder surgery Left  ? ? ?OB History  ?Gravida  ?3  ?Para  ?3  ?Term  ? ?Preterm  ? ?AB  ? ?Living  ? ? ?SAB  ? ?IAB  ? ?Ectopic  ? ?Molar  ? ?Multiple  ? ?Live Births  ? ? ? ?Obstetric Comments  ?Age at first period 12 ?Age of first pregnancy 84 ? ? ? ? ?Social History  ? ?Socioeconomic History  ? Marital status: Widowed  ?Tobacco Use  ? Smoking status: Former  ?Packs/day: 1.00  ?Years: 40.00  ?Pack years: 40.00  ?Types: Cigarettes  ?Quit date: 05/01/2008  ?Years since quitting: 13.3  ?  Smokeless tobacco: Never  ?Substance and Sexual Activity  ? Alcohol use: Never  ? Drug use: Never  ? ? ?Allergies  ?Allergen Reactions  ? Celebrex [Celecoxib] Swelling  ? Mobic [Meloxicam] Swelling  ? Adhesive Tape-Silicones Itching and Rash  ? ?Current Outpatient Medications  ?Medication Sig Dispense Refill  ? albuterol 90 mcg/actuation inhaler USE 2 INHALATIONS BY MOUTH EVERY 6 HOURS AS NEEDED FOR WHEEZING OR SHORTNESS OF BREATH  ? ascorbic acid (VITAMIN C ORAL) Take by mouth once daily  ? aspirin 81 MG EC tablet Take 81 mg by mouth once daily  ? atorvastatin (LIPITOR) 20 MG tablet Take 20 mg by mouth once daily  ? cranberry conc-ascorbic acid (SUPER CRANBERRY) 140-100 mg Cap Take 2 capsules by mouth once daily  ? cyanocobalamin, vitamin B-12, (VITAMIN B-12 ORAL) Take by mouth once daily  ? docosahexaenoic acid-epa 120-180 mg Cap Take by mouth  ? fexofenadine (ALLEGRA) 180 MG tablet Take by mouth  ? fluticasone-umeclidinium-vilanterol (TRELEGY ELLIPTA) 100-62.5-25 mcg inhaler USE 1 INHALATION BY MOUTH ONCE DAILY AT THE SAME TIME EACH DAY  ? FUROsemide (LASIX) 20 MG tablet  ? gabapentin (NEURONTIN) 300 MG capsule gabapentin 300 mg capsule  ? glucosamine HCl 1,500 mg Tab Take 1 tablet by mouth once daily  ? levothyroxine (SYNTHROID) 150 MCG tablet levothyroxine 150 mcg tablet  ?  levothyroxine (SYNTHROID) 150 MCG tablet  ? losartan (COZAAR) 100 MG tablet Take 1 tablet by mouth once daily  ? omeprazole (PRILOSEC) 40 MG DR capsule omeprazole 40 mg capsule,delayed release  ? pen needle, diabetic 32 gauge x 5/32" Ndle Use once daily with Forteo pen  ? pravastatin (PRAVACHOL) 40 MG tablet pravastatin 40 mg tablet  ? teriparatide (FORTEO) 20 mcg/dose (656mg/2.48mL) pen injector Inject subcutaneously  ? traMADoL (ULTRAM) 50 mg tablet tramadol 50 mg tablet  ? vitamin E acetate (VITAMIN E ORAL) Take by mouth once daily  ? ?No current facility-administered medications for this visit.  ? ?Family History  ?Problem Relation Age  of Onset  ? Lung cancer Mother  ? Heart failure Father  ? Breast cancer Neg Hx  ? ?Labs and Radiology:  ? ?July 22, 2021 laboratory: ? ?Creatinine 0.68, BUN 17, estimated GFR 91. Normal electrolytes. Potassium 5.1. Normal liver function studies. ? ?Cologuard test dated A05-06-2021 Positive. ? ?Pathology review: ? ?Rectal polyp removed May 19, 2011: Polypoid fragment of squamocolumnar mucosa with regenerative epithelial changes. History compatible with a small hyperplastic polyp. ? ?Review of Systems  ?Constitutional: Negative for chills and fever.  ?Respiratory: Negative for cough.  ? ? ?Objective:  ?Physical Exam ?Exam conducted with a chaperone present.  ?Constitutional:  ?Appearance: Normal appearance.  ?Cardiovascular:  ?Rate and Rhythm: Normal rate and regular rhythm.  ?Pulses: Normal pulses.  ?Heart sounds: Normal heart sounds.  ?Pulmonary:  ?Effort: Pulmonary effort is normal.  ?Breath sounds: Normal breath sounds.  ?Musculoskeletal:  ?Cervical back: Neck supple.  ?Skin: ?General: Skin is warm and dry.  ?Neurological:  ?Mental Status: She is alert and oriented to person, place, and time.  ?Psychiatric:  ?Mood and Affect: Mood normal.  ?Behavior: Behavior normal.  ? ? ?Assessment:  ? ?Positive Cologuard test. ? ?Plan:  ? ?Indications for colonoscopy reviewed. Risk and benefits discussed. The patient did well with exam 10 years ago, although she prep stressful as she was up all night in the restroom. ? ? ?This note is partially prepared by MLedell Noss CMA acting as a scribe in the presence of Dr. JHervey Ard MD.  ? ?The documentation recorded by the scribe accurately reflects the service I personally performed and the decisions made by me.  ? ?JRobert Bellow MD FACS ?

## 2021-09-20 ENCOUNTER — Encounter: Payer: Self-pay | Admitting: General Surgery

## 2021-09-21 ENCOUNTER — Encounter: Payer: Self-pay | Admitting: General Surgery

## 2021-09-21 ENCOUNTER — Ambulatory Visit: Payer: Medicare Other | Admitting: Registered Nurse

## 2021-09-21 ENCOUNTER — Encounter
Admission: RE | Disposition: A | Discharge status: Home / Self Care | Payer: Self-pay | Source: Home / Self Care | Attending: General Surgery

## 2021-09-21 ENCOUNTER — Ambulatory Visit
Admission: RE | Admit: 2021-09-21 | Discharge: 2021-09-21 | Disposition: A | Discharge status: Home / Self Care | Payer: Medicare Other | Attending: General Surgery | Admitting: General Surgery

## 2021-09-21 DIAGNOSIS — F17210 Nicotine dependence, cigarettes, uncomplicated: Secondary | ICD-10-CM | POA: Diagnosis not present

## 2021-09-21 DIAGNOSIS — Z1211 Encounter for screening for malignant neoplasm of colon: Secondary | ICD-10-CM | POA: Insufficient documentation

## 2021-09-21 DIAGNOSIS — R195 Other fecal abnormalities: Secondary | ICD-10-CM | POA: Insufficient documentation

## 2021-09-21 DIAGNOSIS — Q438 Other specified congenital malformations of intestine: Secondary | ICD-10-CM | POA: Insufficient documentation

## 2021-09-21 HISTORY — DX: Essential (primary) hypertension: I10

## 2021-09-21 HISTORY — DX: Unspecified asthma, uncomplicated: J45.909

## 2021-09-21 HISTORY — PX: COLONOSCOPY WITH PROPOFOL: SHX5780

## 2021-09-21 SURGERY — COLONOSCOPY WITH PROPOFOL
Anesthesia: General

## 2021-09-21 MED ORDER — PROPOFOL 10 MG/ML IV BOLUS
INTRAVENOUS | Status: DC | PRN
  Administered 2021-09-21: 70 mg via INTRAVENOUS
  Administered 2021-09-21: 20 mg via INTRAVENOUS

## 2021-09-21 MED ORDER — PROPOFOL 500 MG/50ML IV EMUL
INTRAVENOUS | Status: DC | PRN
  Administered 2021-09-21: 140 ug/kg/min via INTRAVENOUS

## 2021-09-21 MED ORDER — SODIUM CHLORIDE 0.9 % IV SOLN
INTRAVENOUS | Status: DC
  Administered 2021-09-21: 1000 mL via INTRAVENOUS

## 2021-09-21 NOTE — Anesthesia Postprocedure Evaluation (Signed)
Anesthesia Post Note  Patient: Carolyn Steele  Procedure(s) Performed: COLONOSCOPY WITH PROPOFOL  Patient location during evaluation: PACU Anesthesia Type: General Level of consciousness: awake and alert, oriented and patient cooperative Pain management: pain level controlled Vital Signs Assessment: post-procedure vital signs reviewed and stable Respiratory status: spontaneous breathing, nonlabored ventilation and respiratory function stable Cardiovascular status: blood pressure returned to baseline and stable Postop Assessment: adequate PO intake Anesthetic complications: no   No notable events documented.   Last Vitals:  Vitals:   09/21/21 0909 09/21/21 0919  BP: 135/66 138/73  Pulse: 75 67  Resp: 19 18  Temp:    SpO2: 96% 94%    Last Pain:  Vitals:   09/21/21 0919  TempSrc:   PainSc: 0-No pain                 Darrin Nipper

## 2021-09-21 NOTE — Transfer of Care (Signed)
Immediate Anesthesia Transfer of Care Note  Patient: Carolyn Steele  Procedure(s) Performed: COLONOSCOPY WITH PROPOFOL  Patient Location: PACU  Anesthesia Type:General  Level of Consciousness: awake, alert  and oriented  Airway & Oxygen Therapy: Patient Spontanous Breathing  Post-op Assessment: Report given to RN and Post -op Vital signs reviewed and stable  Post vital signs: Reviewed and stable  Last Vitals:  Vitals Value Taken Time  BP    Temp    Pulse 74 09/21/21 0900  Resp 14 09/21/21 0900  SpO2 96 % 09/21/21 0900  Vitals shown include unvalidated device data.  Last Pain:  Vitals:   09/21/21 0859  TempSrc:   PainSc: Asleep         Complications: No notable events documented.

## 2021-09-21 NOTE — H&P (Signed)
Carolyn Steele [id number] 05-22-1945     HPI: Positive Cologuard test. For colonoscopy. Tolerated the prep well.   Medications Prior to Admission  Medication Sig Dispense Refill Last Dose   Ascorbic Acid (VITAMIN C PO) Take by mouth daily.      Calcium Carb-Cholecalciferol (CALCIUM 600 + D PO) Take 1 tablet by mouth daily.   09/20/2021   Cranberry-Vitamin C 140-100 MG CAPS Take 2 capsules by mouth daily.    09/20/2021   Cyanocobalamin (VITAMIN B 12 PO) Take 1,000 mg by mouth daily.   09/20/2021   fexofenadine (ALLEGRA) 180 MG tablet Take 180 mg by mouth daily.   09/20/2021   furosemide (LASIX) 20 MG tablet Take 1 tablet (20 mg total) by mouth daily. 90 tablet 2 09/20/2021   gabapentin (NEURONTIN) 300 MG capsule Take 300 mg by mouth daily.   09/20/2021   Glucosamine HCl 1500 MG TABS Take 1 tablet by mouth daily.   09/20/2021   guaifenesin (HUMIBID E) 400 MG TABS tablet Take 400 mg by mouth daily.   09/20/2021   levothyroxine (SYNTHROID, LEVOTHROID) 150 MCG tablet Take 150 mcg by mouth daily before breakfast. Take 1 tablet M, T, Thurs, Fri and Sat. Take 1/2 tablet Wednesday and Skip Sunday.   09/20/2021   losartan (COZAAR) 100 MG tablet TAKE 1 TABLET BY MOUTH  DAILY 100 tablet 2 09/20/2021   Multiple Vitamins-Minerals (MULTIVITAMIN & MINERAL PO) Take 1 tablet by mouth daily.   09/20/2021   Multiple Vitamins-Minerals (PRESERVISION AREDS 2) CAPS in the morning and at bedtime.   09/20/2021   Omega-3 Fatty Acids (FISH OIL) 1000 MG CAPS Take 500 mg by mouth daily.    09/20/2021   omeprazole (PRILOSEC) 40 MG capsule Take 40 mg by mouth daily.   09/20/2021   OXYGEN Inhale 2 L into the lungs daily.   09/20/2021   pravastatin (PRAVACHOL) 40 MG tablet Take 40 mg by mouth daily.   09/20/2021   Teriparatide, Recombinant, 620 MCG/2.48ML SOPN Inject into the skin daily.   09/20/2021   traMADol (ULTRAM) 50 MG tablet Take 50 mg by mouth every 6 (six) hours as needed for moderate pain.    09/20/2021   TRELEGY ELLIPTA  100-62.5-25 MCG/ACT AEPB USE 1 INHALATION BY MOUTH  ONCE DAILY AT THE SAME TIME EACH DAY 180 each 3 09/21/2021 at 0530   VITAMIN E PO Take by mouth daily.   09/20/2021   albuterol (VENTOLIN HFA) 108 (90 Base) MCG/ACT inhaler USE 2 INHALATIONS BY MOUTH  EVERY 6 HOURS AS NEEDED FOR WHEEZING OR SHORTNESS OF  BREATH 34 g 2  at prn   alendronate (FOSAMAX) 70 MG tablet Take 70 mg by mouth once a week. Take with a full glass of water on an empty stomach. Every Sunday (Patient not taking: Reported on 09/21/2021)   Not Taking   aspirin 81 MG tablet Take 81 mg by mouth daily.      atorvastatin (LIPITOR) 20 MG tablet Take 1 tablet (20 mg total) by mouth daily. 90 tablet 3    DOCOSAHEXAENOIC ACID-EPA PO Take by mouth. 120-121m caps (Patient not taking: Reported on 09/21/2021)   Not Taking   Allergies  Allergen Reactions   Celebrex [Celecoxib] Swelling   Mobic [Meloxicam] Swelling   Adhesive [Tape] Itching and Rash   Past Medical History:  Diagnosis Date   (HFpEF) heart failure with preserved ejection fraction ([place]    a. 12/2018 Echo: EF 60-65%, pseudonormalization. Nl RV fxn. RVSP 51 mmHg. Mild MR/TR;  b. 01/2021 Echo: EF 60-65%, no rwma, GrII DD. Nl RV size/fxn. RVSP 44.42mHg. Mild MR. Mild-mod TR. Mild AoV sclerosis w/o stenosis.   Acid reflux    Arthritis    Asthma    COPD (chronic obstructive pulmonary disease) ([place]    a. Home O2 started 12/2018.   Demand ischemia ([place]    a. 12/2018 mild hsTrop elevation in setting of diast chf/htn urgency-->MV: no ischemia/infarct. EF 55-65%.   Depression    Hypercholesteremia    Hypertension    Hypothyroidism    Wears glasses    Past Surgical History:  Procedure Laterality Date   CHOLECYSTECTOMY     COLONOSCOPY     FOOT SURGERY Right    HAND SURGERY Bilateral    SHOULDER SURGERY Left    SPINAL CORD STIMULATOR INSERTION N/A 06/04/2015   Procedure: LUMBAR SPINAL CORD STIMULATOR INSERTION;  Surgeon: PClydell Hakim MD;  [facility];  Service:  Neurosurgery;  Laterality: N/A;   Social History   Socioeconomic History   Marital status: Widowed    Spouse name: Not on file   Number of children: Not on file   Years of education: Not on file   Highest education level: Not on file  Occupational History   Not on file  Tobacco Use   Smoking status: Former    Packs/day: 1.00    Years: 40.00    Pack years: 40.00    Types: Cigarettes    Quit date: 2010    Years since quitting: 13.4   Smokeless tobacco: Never   Tobacco comments:    quit smoking approximately 10-12 years ago (05/28/2015)  Vaping Use   Vaping Use: Never used  Substance and Sexual Activity   Alcohol use: No   Drug use: No   Sexual activity: Not on file  Other Topics Concern   Not on file  Social History Narrative   Not on file   Social Determinants of Health   Financial Resource Strain: Not on file  Food Insecurity: Not on file  Transportation Needs: Not on file  Physical Activity: Not on file  Stress: Not on file  Social Connections: Not on file  Intimate Partner Violence: Not on file   Social History   Social History Narrative   Not on file     ROS: Negative.     PE: HEENT: Negative. Lungs: Clear. Cardio: RR.  Assessment/Plan:  Proceed with planned endoscopy.  JForest GleasonBSan Antonio Gastro[facility]5/24/2023

## 2021-09-21 NOTE — Anesthesia Preprocedure Evaluation (Addendum)
Anesthesia Evaluation  Patient identified by MRN, date of birth, ID band Patient awake    Reviewed: Allergy & Precautions, NPO status , Patient's Chart, lab work & pertinent test results  History of Anesthesia Complications Negative for: history of anesthetic complications  Airway Mallampati: I   Neck ROM: Full    Dental  (+) Lower Dentures, Upper Dentures   Pulmonary former smoker (quit 2010),    Pulmonary exam normal breath sounds clear to auscultation       Cardiovascular hypertension, +CHF (diastolic)  Normal cardiovascular exam Rhythm:Regular Rate:Normal  ECG 08/25/21: NSR, low voltage   Neuro/Psych PSYCHIATRIC DISORDERS Depression negative neurological ROS     GI/Hepatic hiatal hernia, GERD  ,  Endo/Other  Obesity   Renal/GU negative Renal ROS     Musculoskeletal  (+) Arthritis ,   Abdominal   Peds  Hematology negative hematology ROS (+)   Anesthesia Other Findings Cardiology note 08/25/21:  1.  Chronic diastolic heart failure: She appears to be euvolemic on small dose furosemide.  Blood pressure is well controlled.  2.  COPD: Followed by pulmonary.  3.  Essential hypertension: Blood pressure is not well controlled after increasing the dose of losartan to 100 mg once daily.  4.  Hyperlipidemia: Continue atorvastatin 20 mg once daily with a target LDL of less than 70 given the presence of peripheral arterial disease.  5. Peripheral arterial disease: Mildly reduced ABI with possible inflow disease.  Currently with no claudication as she is mostly limited by chronic low back pain.  Recommend continuing medical therapy.   Disposition:   FU with me in 6 months  Reproductive/Obstetrics                            Anesthesia Physical Anesthesia Plan  ASA: 3  Anesthesia Plan: General   Post-op Pain Management:    Induction: Intravenous  PONV Risk Score and Plan: 3 and  Propofol infusion, TIVA and Treatment may vary due to age or medical condition  Airway Management Planned: Natural Airway  Additional Equipment:   Intra-op Plan:   Post-operative Plan:   Informed Consent: I have reviewed the patients History and Physical, chart, labs and discussed the procedure including the risks, benefits and alternatives for the proposed anesthesia with the patient or authorized representative who has indicated his/her understanding and acceptance.       Plan Discussed with: CRNA  Anesthesia Plan Comments: (LMA/GETA backup discussed.  Patient consented for risks of anesthesia including but not limited to:  - adverse reactions to medications - damage to eyes, teeth, lips or other oral mucosa - nerve damage due to positioning  - sore throat or hoarseness - damage to heart, brain, nerves, lungs, other parts of body or loss of life  Informed patient about role of CRNA in peri- and intra-operative care.  Patient voiced understanding.)        Anesthesia Quick Evaluation

## 2021-09-21 NOTE — Op Note (Signed)
[facility] Gastroenterology Patient Name: Carolyn Steele Procedure Date: 09/21/2021 8:06 AM MRN: [id number] Account #: [id number] Date of Birth: 1945-10-02 Admit Type: Outpatient Age: 76 Room: [facility] ENDO ROOM 1 Gender: Female Note Status: Finalized Instrument Name: Peds Colonoscope [id number] Procedure:             Colonoscopy Indications:           Positive Cologuard test Providers:             Robert Bellow, MD Referring MD:          Leona Carry. Hall Busing, MD (Referring MD) Medicines:             Propofol per Anesthesia Complications:         No immediate complications. Procedure:             Pre-Anesthesia Assessment:                        - Prior to the procedure, a History and Physical was                         performed, and patient medications, allergies and                         sensitivities were reviewed. The patient's tolerance                         of previous anesthesia was reviewed.                        - The risks and benefits of the procedure and the                         sedation options and risks were discussed with the                         patient. All questions were answered and informed                         consent was obtained.                        After obtaining informed consent, the colonoscope was                         passed under direct vision. Throughout the procedure,                         the patient's blood pressure, pulse, and oxygen                         saturations were monitored continuously. The                         Colonoscope was introduced through the anus and                         advanced to the the cecum, identified by appendiceal  orifice and ileocecal valve. The colonoscopy was                         somewhat difficult due to a tortuous colon. Successful                         completion of the procedure was aided by using manual                         pressure. The  patient tolerated the procedure well.                         The quality of the bowel preparation was adequate to                         identify polyps. Findings:      The entire examined colon appeared normal on direct and retroflexion       views. Impression:            - The entire examined colon is normal on direct and                         retroflexion views.                        - No specimens collected. Recommendation:        - Discharge patient to home (via wheelchair). Procedure Code(s):     --- Professional ---                        [phone], Colonoscopy, flexible; diagnostic, including                         collection of specimen(s) by brushing or washing, when                         performed (separate procedure) Diagnosis Code(s):     --- Professional ---                        R19.5, Other fecal abnormalities CPT copyright 2019 American Medical Association. All rights reserved. The codes documented in this report are preliminary and upon coder review may  be revised to meet current compliance requirements. Robert Bellow, MD 09/21/2021 8:57:37 AM This report has been signed electronically. Number of Addenda: 0 Note Initiated On: 09/21/2021 8:06 AM Scope Withdrawal Time: 0 hours 13 minutes 33 seconds  Total Procedure Duration: 0 hours 28 minutes 17 seconds  Estimated Blood Loss:  Estimated blood loss: none.      [facility]

## 2021-10-17 ENCOUNTER — Other Ambulatory Visit: Payer: Self-pay | Admitting: Internal Medicine

## 2021-10-17 ENCOUNTER — Encounter: Payer: Self-pay | Admitting: Pulmonary Disease

## 2021-10-17 ENCOUNTER — Ambulatory Visit: Payer: Medicare Other | Admitting: Pulmonary Disease

## 2021-10-17 VITALS — BP 116/68 | HR 79 | Temp 98.3°F | Ht 60.0 in | Wt 158.0 lb

## 2021-10-17 DIAGNOSIS — I5032 Chronic diastolic (congestive) heart failure: Secondary | ICD-10-CM | POA: Diagnosis not present

## 2021-10-17 DIAGNOSIS — I2729 Other secondary pulmonary hypertension: Secondary | ICD-10-CM

## 2021-10-17 DIAGNOSIS — I2723 Pulmonary hypertension due to lung diseases and hypoxia: Secondary | ICD-10-CM | POA: Diagnosis not present

## 2021-10-17 DIAGNOSIS — G4736 Sleep related hypoventilation in conditions classified elsewhere: Secondary | ICD-10-CM

## 2021-10-17 DIAGNOSIS — Z1231 Encounter for screening mammogram for malignant neoplasm of breast: Secondary | ICD-10-CM

## 2021-10-17 DIAGNOSIS — J449 Chronic obstructive pulmonary disease, unspecified: Secondary | ICD-10-CM | POA: Diagnosis not present

## 2021-10-17 NOTE — Patient Instructions (Signed)
We are going to see him in 4 months time.  We will have the breathing test done before you come back to see [place].  Continue using your Trelegy and your oxygen at nighttime.

## 2021-10-17 NOTE — Progress Notes (Signed)
Subjective:    Patient ID: Carolyn Steele, female    DOB: Jun 13, 1945, 76 y.o.   MRN: [id number] Patient Care Team: Albina Billet, MD as PCP - General (Internal Medicine) Wellington Hampshire, MD as PCP - Cardiology (Cardiology)  Chief Complaint  Patient presents with   Follow-up    SOB with exertion.    DATA: 01/10/2019 CT angio chest: No PE, bilateral pleural effusions, small, right lung compressive atelectasis, cardiomegaly, centrilobular emphysema.  There is also moderate hiatal hernia 08/28/2019 overnight oximetry: Desaturations to as low as 83%, patient instructed to use 2 L/min nocturnally 09/01/2019 PFTs: FEV1 0.75 L or 40% predicted FEV1/FVC 46%, FVC 1.63 L or 66% predicted.  Postbronchodilator patient had 34% change in FEV1 and 27% change in FVC indicating asthmatic component.  Diffusion capacity moderately reduced.  Consistent with severe COPD with asthmatic component 09/01/2019 2D echo: LVEF is 55 to 60%, mild LVH, mild elevation of the pulmonary artery systolic pressure not determined, tricuspid regurgitation mild to moderate 02/01/2021 echocardiogram: LVEF 60 to 65%, Grade II DD, mild elevation in pulmonary systolic pressure, RV systolic pressure 40.9 mmHg.  INTERVAL: She was last seen here 03/30/2021 at that time appeared to be well compensated from a COPD standpoint. She is on nocturnal oxygen at 2 L/min, compliant with therapy and notes therapy helpful.  Did not have PFTs ordered due to equipment issues.  She is to be rescheduled  HPI Patient is a 76 year old former smoker with a past 40-pack-year history of smoking who presents for follow-up on the issue of COPD with asthma overlap and pulmonary hypertension due to diastolic dysfunction and COPD.  This is a scheduled visit.  Patient presents today with no overt complaint.  She has been compliant with nocturnal oxygen.  She notes good quality of sleep and wakes up refreshed in the mornings.  Notices also less dyspnea during  the day.  She has been compliant with Trelegy which continues to help her with her COPD/asthma symptoms.  No major flares of asthma and or allergies lately.She uses Allegra-D and Flonase to control allergy symptoms and only uses these seasonally.  She does not note increased dyspnea.  She can still perform her activities of daily living provided she "paces herself".  She is still able to maintain a small garden in the home.  No cough, sputum production or hemoptysis. No chest pain, orthopnea or paroxysmal nocturnal dyspnea.  No lower extremity edema, no calf tenderness. Overall she feels well and looks well.   Review of Systems A 10 point review of systems was performed and it is as noted above otherwise negative.     Objective:   Physical Exam BP 116/68 (BP Location: Left Arm, Cuff Size: Normal)   Pulse 79   Temp 98.3 F (36.8 C) (Temporal)   Ht 5' (1.524 m)   Wt 158 lb (71.7 kg)   SpO2 95%   BMI 30.86 kg/m  GENERAL: Frail-appearing woman, no acute distress, fully ambulatory.  Uses cane for ambulating assistance. HEAD: Normocephalic, atraumatic.  EYES: Pupils equal, round, reactive to light.  No scleral icterus.  MOUTH: Wears dentures uppers and lowers NECK: Supple. No thyromegaly. Trachea midline. No JVD.  No adenopathy. PULMONARY: Increased AP diameter.  Good air entry bilaterally.  Coarse breath sounds, otherwise, no adventitious sounds. CARDIOVASCULAR: S1 and S2. Regular rate and rhythm.  ABDOMEN: Benign. MUSCULOSKELETAL: Significant kyphosis.  OA changes both hands, no clubbing, no edema.  NEUROLOGIC: No focal deficit, speech is fluent, gait without  disturbance though does use cane for steadying. SKIN: Intact,warm,dry. PSYCH: Mood and behavior are normal.     Assessment & Plan:     ICD-10-CM   1. COPD with asthma ([place])  J44.9    Well compensated on her Trelegy Has not required rescue albuterol of late PFTs pending    2. 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 Chronic diastolic heart failure (HCC)  I50.32    Managed by cardiology This issue adds complexity to her management Adds to her issue of pulmonary hypertension     Patient appears to be well compensated.  We will see her in follow-up in 4 months time.  She will have her PFTs rescheduled prior to her follow-up appointment.  Orders already in.  She is to contact us prior to follow-up appointment should any new difficulties arise.  Renold Don, MD Advanced Bronchoscopy PCCM [facility]    *This note was dictated using voice recognition software/Dragon.  Despite best efforts to proofread, errors can occur which can change the meaning. Any transcriptional errors that result from this process are unintentional and may not be fully corrected at the time of dictation.

## 2021-11-28 ENCOUNTER — Ambulatory Visit
Admission: RE | Admit: 2021-11-28 | Discharge: 2021-11-28 | Disposition: A | Discharge status: Home / Self Care | Payer: Medicare Other | Source: Ambulatory Visit | Attending: Internal Medicine | Admitting: Internal Medicine

## 2021-11-28 DIAGNOSIS — Z1231 Encounter for screening mammogram for malignant neoplasm of breast: Secondary | ICD-10-CM | POA: Diagnosis not present

## 2021-12-01 ENCOUNTER — Other Ambulatory Visit: Payer: Self-pay | Admitting: Nurse Practitioner

## 2022-02-02 ENCOUNTER — Other Ambulatory Visit: Payer: Self-pay | Admitting: Nurse Practitioner

## 2022-02-09 ENCOUNTER — Ambulatory Visit: Payer: Medicare Other | Admitting: Cardiovascular Disease

## 2022-02-15 ENCOUNTER — Ambulatory Visit: Payer: Medicare Other | Admitting: Pulmonary Disease

## 2022-02-15 ENCOUNTER — Encounter: Payer: Self-pay | Admitting: Pulmonary Disease

## 2022-02-15 VITALS — BP 124/80 | HR 85 | Temp 97.6°F | Ht 60.0 in | Wt 166.4 lb

## 2022-02-15 DIAGNOSIS — Z87891 Personal history of nicotine dependence: Secondary | ICD-10-CM

## 2022-02-15 DIAGNOSIS — I2729 Other secondary pulmonary hypertension: Secondary | ICD-10-CM

## 2022-02-15 DIAGNOSIS — I2723 Pulmonary hypertension due to lung diseases and hypoxia: Secondary | ICD-10-CM

## 2022-02-15 DIAGNOSIS — J449 Chronic obstructive pulmonary disease, unspecified: Secondary | ICD-10-CM

## 2022-02-15 DIAGNOSIS — J4489 Other specified chronic obstructive pulmonary disease: Secondary | ICD-10-CM

## 2022-02-15 DIAGNOSIS — I5032 Chronic diastolic (congestive) heart failure: Secondary | ICD-10-CM

## 2022-02-15 DIAGNOSIS — G4736 Sleep related hypoventilation in conditions classified elsewhere: Secondary | ICD-10-CM

## 2022-02-15 NOTE — Patient Instructions (Signed)
I recommend that you get the RSV shot this year.  You can get this at either [facility] or [facility].  We will reschedule the breathing test.  We will see you in follow-up in 6 months time call sooner should any new problems arise.

## 2022-02-15 NOTE — Progress Notes (Signed)
Subjective:    Patient ID: Carolyn Steele, female    DOB: July 28, 1945, 76 y.o.   MRN: [id number] Patient Care Team: Albina Billet, MD as PCP - General (Internal Medicine) Wellington Hampshire, MD as PCP - Cardiology (Cardiology)  Chief Complaint  Patient presents with   Follow-up    COPD. No SOB or wheezing. Occasional cough with clear sputum.    DATA: 01/10/2019 CT angio chest: No PE, bilateral pleural effusions, small, right lung compressive atelectasis, cardiomegaly, centrilobular emphysema.  There is also moderate hiatal hernia 08/28/2019 overnight oximetry: Desaturations to as low as 83%, patient instructed to use 2 L/min nocturnally 09/01/2019 PFTs: FEV1 0.75 L or 40% predicted FEV1/FVC 46%, FVC 1.63 L or 66% predicted.  Postbronchodilator patient had 34% change in FEV1 and 27% change in FVC indicating asthmatic component.  Diffusion capacity moderately reduced.  Consistent with severe COPD with asthmatic component 09/01/2019 2D echo: LVEF is 55 to 60%, mild LVH, mild elevation of the pulmonary artery systolic pressure not determined, tricuspid regurgitation mild to moderate 02/01/2021 echocardiogram: LVEF 60 to 65%, Grade II DD, mild elevation in pulmonary systolic pressure, RV systolic pressure 23.9 mmHg.   INTERVAL: She was last seen here 10/17/2021 at that time appeared to be well compensated from a COPD standpoint. She is on nocturnal oxygen at 2 L/min, compliant with therapy and notes therapy helpful.  Did not have PFTs done states no one called her.  HPI Patient is a 76 year old former smoker with a past 40-pack-year history of smoking who presents for follow-up on the issue of COPD with asthma overlap and pulmonary hypertension due to diastolic dysfunction and COPD.  This is a scheduled visit.  Patient presents today with no overt complaint.  She has been compliant with nocturnal oxygen.  She notes good quality of sleep and wakes up refreshed in the mornings.  Notices also less  dyspnea during the day.  She has been compliant with Trelegy which continues to help her with her COPD/asthma symptoms.  No major flares of asthma and or allergies lately.She uses Allegra-D and Flonase to control allergy symptoms and only uses these seasonally.  She does not note increased dyspnea.  She can still perform her activities of daily living provided she "paces herself".  She is still able to maintain a small garden in the home.  No cough, sputum production or hemoptysis. No chest pain, orthopnea or paroxysmal nocturnal dyspnea.  No lower extremity edema, no calf tenderness.  Has not had PFTs as previously ordered.  These will need to be reordered.  Overall she feels well and looks well.   Review of Systems A 10 point review of systems was performed and it is as noted above otherwise negative.  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 ([place]) 08/30/2020   COPD with asthma 01/12/2020   Acute on chronic diastolic heart failure (HCC)    CHF (congestive heart failure) ([place]) 01/10/2019   Social History   Tobacco Use   Smoking status: Former    Packs/day: 1.00    Years: 40.00    Total pack years: 40.00    Types: Cigarettes    Quit date: 2010    Years since quitting: 13.8   Smokeless tobacco: Never   Tobacco comments:    quit smoking approximately 10-12 years ago (05/28/2015)  Substance Use Topics   Alcohol use: No  Allergies  Allergen Reactions   Celebrex [Celecoxib] Swelling   Mobic [Meloxicam] Swelling   Adhesive [Tape] Itching and Rash   Silicone Itching and Rash   Current Meds  Medication Sig   albuterol (VENTOLIN HFA) 108 (90 Base) MCG/ACT inhaler USE 2 INHALATIONS BY MOUTH  EVERY 6 HOURS AS NEEDED FOR WHEEZING OR SHORTNESS OF  BREATH   Ascorbic Acid (VITAMIN C PO) Take by mouth daily.   aspirin 81 MG tablet Take 81 mg by mouth daily.   atorvastatin  (LIPITOR) 20 MG tablet TAKE 1 TABLET BY MOUTH ONCE  DAILY   Calcium Carb-Cholecalciferol (CALCIUM 600 + D PO) Take 1 tablet by mouth daily.   Cranberry-Vitamin C 140-100 MG CAPS Take 2 capsules by mouth daily.    fexofenadine (ALLEGRA) 180 MG tablet Take 180 mg by mouth daily.   furosemide (LASIX) 20 MG tablet Take 1 tablet (20 mg total) by mouth daily.   gabapentin (NEURONTIN) 300 MG capsule Take 300 mg by mouth daily.   Glucosamine HCl 1500 MG TABS Take 1 tablet by mouth daily.   guaifenesin (HUMIBID E) 400 MG TABS tablet Take 400 mg by mouth daily.   levothyroxine (SYNTHROID, LEVOTHROID) 150 MCG tablet Take 150 mcg by mouth daily before breakfast. Take 1 tablet M, T, Thurs, Fri and Sat. Take 1/2 tablet Wednesday and Skip Sunday.   losartan (COZAAR) 100 MG tablet TAKE 1 TABLET BY MOUTH  DAILY   Multiple Vitamins-Minerals (MULTIVITAMIN & MINERAL PO) Take 1 tablet by mouth daily.   Multiple Vitamins-Minerals (PRESERVISION AREDS 2) CAPS in the morning and at bedtime.   Omega-3 Fatty Acids (FISH OIL) 1000 MG CAPS Take 500 mg by mouth daily.    omeprazole (PRILOSEC) 40 MG capsule Take 40 mg by mouth daily.   OXYGEN Inhale 2 L into the lungs daily.   Teriparatide, Recombinant, 620 MCG/2.48ML SOPN Inject into the skin daily.   traMADol (ULTRAM) 50 MG tablet Take 50 mg by mouth every 6 (six) hours as needed for moderate pain.    TRELEGY ELLIPTA 100-62.5-25 MCG/ACT AEPB USE 1 INHALATION BY MOUTH  ONCE DAILY AT THE SAME TIME EACH DAY   VITAMIN E PO Take by mouth daily.   Immunization History  Administered Date(s) Administered   Fluad Quad(high Dose 65+) 01/30/2019   Influenza-Unspecified 02/16/2020   Zoster Recombinat (Shingrix) 06/17/2018, 11/20/2018       Objective:   Physical Exam BP 124/80 (BP Location: Left Arm, Cuff Size: Normal)   Pulse 85   Temp 97.6 F (36.4 C)   Ht 5' (1.524 m)   Wt 166 lb 6.4 oz (75.5 kg)   SpO2 96%   BMI 32.50 kg/m  GENERAL: Frail-appearing woman, no acute  distress, fully ambulatory.  Uses cane for ambulating assistance. HEAD: Normocephalic, atraumatic.  EYES: Pupils equal, round, reactive to light.  No scleral icterus.  MOUTH: Wears dentures uppers and lowers NECK: Supple. No thyromegaly. Trachea midline.  Prominent A waves.  No adenopathy. PULMONARY: Increased AP diameter.  Good air entry bilaterally.  Coarse breath sounds, otherwise, no adventitious sounds. CARDIOVASCULAR: S1 and S2. Regular rate and rhythm.  No rubs, murmurs or gallops heard. ABDOMEN: Benign. MUSCULOSKELETAL: Significant kyphosis.  OA changes both hands, no clubbing, no edema.  NEUROLOGIC: No focal deficit, speech is fluent, gait without disturbance though does use cane for steadying. SKIN: Intact,warm,dry. PSYCH: Mood and behavior are normal.  Declines influenza vaccine.     Assessment & Plan:     ICD-10-CM   1.  COPD with asthma  J44.89 Pulmonary Function Test [facility] Only   Appears to be well compensated at present Reassess with PFTs Continue Trelegy and as needed albuterol    2. Pulmonary hypertension due to COPD ([place])  I27.23    J44.9    Continue nocturnal oxygen supplementation COPD well compensated at present    3. 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 Chronic diastolic heart failure (HCC)  I50.32    Grade 2 diastolic dysfunction,echo 01/2021 Appears clinically well compensated Continue follow-up with cardiology    5. Former smoker  Z87.891    Quit 2017, 46 PY No evidence of relapse     Orders Placed This Encounter  Procedures   Pulmonary Function Test [facility] Only    Standing Status:   Future    Standing Expiration Date:   08/17/2022    Order Specific Question:   Full PFT: includes the following: basic spirometry, spirometry pre & post bronchodilator, diffusion capacity (DLCO), lung volumes     Answer:   Full PFT    Order Specific Question:   This test can only be performed at    Answer:   [facility]    Patient appears well compensated.  We will continue Trelegy and as needed albuterol.  We will see her in follow-up in 6 months time she is to contact us prior to that time should any new difficulties arise.  Renold Don, MD Advanced Bronchoscopy PCCM [facility]    *This note was dictated using voice recognition software/Dragon.  Despite best efforts to proofread, errors can occur which can change the meaning. Any transcriptional errors that result from this process are unintentional and may not be fully corrected at the time of dictation.

## 2022-02-21 NOTE — Progress Notes (Signed)
Cardiology Office Note    Date:  02/24/2022   ID:  Carolyn Steele, DOB 04/16/1946, MRN [id number]  PCP:  Albina Billet, MD  Cardiologist:  Kathlyn Sacramento, MD  Electrophysiologist:  None   Chief Complaint: Follow up  History of Present Illness:   Carolyn Steele is a 76 y.o. female with history of HFpEF, chronic hypoxic respiratory failure on nocturnal home oxygen, COPD, PAD medically managed, HTN, HLD, hypothyroidism, chronic back pain status post back stimulator, and GERD who presents for follow-up of HFpEF.  She was admitted to the hospital in 12/2018 with heart failure in the setting of uncontrolled hypertension.  Echo at that time showed normal LV systolic function with evidence of diastolic dysfunction.  Troponin was mildly elevated and felt to be related to supply demand ischemia.  Stress testing showed no evidence of ischemia or infarct.  Echo in 08/2019 showed normal LV systolic function, normal diastolic function, mild to moderate tricuspid regurgitation with mild pulmonary hypertension with an estimated PASP of 45 mmHg.  Noninvasive lower extremity imaging in 08/2019 showed an ABI of 0.75 on the right and 0.95 on the left.  Duplex showed significant iliac disease, worse on the right.  She has been medically managed in the context of no claudication.  She was admitted to [facility] in 12/2019 with a large pneumothorax after she fractured her ribs from a fall and required chest tube placement.  She was evaluated in our office in 01/2021 with elevated BP leading to titration of losartan.  Echo done at that time showed normal LV systolic function, grade 2 diastolic dysfunction, mild to moderate pulmonary hypertension, mild mitral regurgitation, and mild to moderate tricuspid regurgitation.  She was last seen in our office in 07/2021 and was without symptoms of angina or decompensation.  Blood pressure was improved.  No changes were indicated.  She is doing well from a cardiac perspective, without  symptoms of angina or decompensation.  No dizziness, presyncope, or syncope.  No lower extremity swelling or orthopnea.  No symptoms of claudication.  She does indicate she becomes fatigued if she ambulates for an extended period of time if she is not able to hold onto things.  She attributes this to her underlying osteoporosis and gait.  If she is able to hold onto something with ambulation, she is asymptomatic.  She is tolerating cardiac medications without issues.  She has had 2 mechanical falls since she was last seen, though did not hit her head or suffer LOC.  No bleeding concerns.  Overall, patient feels like she is doing well from a cardiac perspective and does not have any cardiac concerns at this time.   Labs independently reviewed: We will obtain updated labs from her PCPs office.  Past Medical History:  Diagnosis Date   (HFpEF) heart failure with preserved ejection fraction ([place])    a. 12/2018 Echo: EF 60-65%, pseudonormalization. Nl RV fxn. RVSP 51 mmHg. Mild MR/TR; b. 01/2021 Echo: EF 60-65%, no rwma, GrII DD. Nl RV size/fxn. RVSP 44.53mHg. Mild MR. Mild-mod TR. Mild AoV sclerosis w/o stenosis.   Acid reflux    Arthritis    Asthma    [place]    a. [place] O2 started 12/2018.   Demand ischemia    a. 12/2018 mild hsTrop elevation in setting of diast chf/htn urgency-->MV: no ischemia/infarct. EF 55-65%.   Depression    Hypercholesteremia    Hypertension    Hypothyroidism    Wears glasses  Past Surgical History:  Procedure Laterality Date   CHOLECYSTECTOMY     COLONOSCOPY     COLONOSCOPY WITH PROPOFOL N/A 09/21/2021   Procedure: COLONOSCOPY WITH PROPOFOL;  Surgeon: Robert Bellow, MD;  Location: [facility] ENDOSCOPY;  Service: Endoscopy;  Laterality: N/A;   FOOT SURGERY Right    HAND SURGERY Bilateral    SHOULDER SURGERY Left    SPINAL CORD STIMULATOR INSERTION N/A 06/04/2015   Procedure: LUMBAR SPINAL CORD STIMULATOR INSERTION;  Surgeon:  Clydell Hakim, MD;  Location: [place] [facility]UR[facility] OR[facility];  Service: Neurosurgery;  Laterality: N/A;    Current Medications: Current Meds  Medication Sig   albuterol (VENTOLIN HFA) 108 (90 Base) MCG/ACT inhaler USE 2 INHALATIONS BY MOUTH  EVERY 6 HOURS AS NEEDED FOR WHEEZING OR SHORTNESS OF  BREATH   Ascorbic Acid (VITAMIN C PO) Take by mouth daily.   aspirin 81 MG tablet Take 81 mg by mouth daily.   atorvastatin (LIPITOR) 20 MG tablet TAKE 1 TABLET BY MOUTH ONCE  DAILY   Calcium Carb-Cholecalciferol (CALCIUM 600 + D PO) Take 1 tablet by mouth daily.   Cranberry-Vitamin C 140-100 MG CAPS Take 2 capsules by mouth daily.    fexofenadine (ALLEGRA) 180 MG tablet Take 180 mg by mouth daily.   furosemide (LASIX) 20 MG tablet Take 1 tablet (20 mg total) by mouth daily.   gabapentin (NEURONTIN) 300 MG capsule Take 300 mg by mouth daily.   Glucosamine HCl 1500 MG TABS Take 1 tablet by mouth daily.   guaifenesin (HUMIBID E) 400 MG TABS tablet Take 400 mg by mouth daily.   levothyroxine (SYNTHROID, LEVOTHROID) 150 MCG tablet Take 150 mcg by mouth daily before breakfast. Take 1 tablet M, T, Thurs, Fri and Sat. Take 1/2 tablet Wednesday and Skip Sunday.   losartan (COZAAR) 100 MG tablet TAKE 1 TABLET BY MOUTH  DAILY   Multiple Vitamins-Minerals (MULTIVITAMIN & MINERAL PO) Take 1 tablet by mouth daily.   Multiple Vitamins-Minerals (PRESERVISION AREDS 2) CAPS in the morning and at bedtime.   Omega-3 Fatty Acids (FISH OIL) 1000 MG CAPS Take 500 mg by mouth daily.    omeprazole (PRILOSEC) 40 MG capsule Take 40 mg by mouth daily.   OXYGEN Inhale 2 L into the lungs daily.   Teriparatide, Recombinant, 620 MCG/2.48ML SOPN Inject into the skin daily.   traMADol (ULTRAM) 50 MG tablet Take 50 mg by mouth every 6 (six) hours as needed for moderate pain.    TRELEGY ELLIPTA 100-62.5-25 MCG/ACT AEPB USE 1 INHALATION BY MOUTH  ONCE DAILY AT THE SAME TIME EACH DAY   VITAMIN E PO Take by mouth daily.    Allergies:   Celebrex  [celecoxib], Mobic [meloxicam], Adhesive [tape], and Silicone   Social History   Socioeconomic History   Marital status: Widowed    Spouse name: Not on file   Number of children: Not on file   Years of education: Not on file   Highest education level: Not on file  Occupational History   Not on file  Tobacco Use   Smoking status: Former    Packs/day: 1.00    Years: 40.00    Total pack years: 40.00    Types: Cigarettes    Quit date: 2010    Years since quitting: 13.8   Smokeless tobacco: Never   Tobacco comments:    quit smoking approximately 10-12 years ago (05/28/2015)  Vaping Use   Vaping Use: Never used  Substance and Sexual Activity   Alcohol use: No  Drug use: No   Sexual activity: Not on file  Other Topics Concern   Not on file  Social History Narrative   Not on file   Social Determinants of Health   Financial Resource Strain: Low Risk  (03/03/2019)   Overall Financial Resource Strain (CARDIA)    Difficulty of Paying Living Expenses: Not hard at all  Food Insecurity: No Food Insecurity (03/03/2019)   Hunger Vital Sign    Worried About Running Out of Food in the Last Year: Never true    Ran Out of Food in the Last Year: Never true  Transportation Needs: No Transportation Needs (03/03/2019)   PRAPARE - Hydrologist (Medical): No    Lack of Transportation (Non-Medical): No  Physical Activity: Insufficiently Active (03/03/2019)   Exercise Vital Sign    Days of Exercise per Week: 7 days    Minutes of Exercise per Session: 20 min  Stress: No Stress Concern Present (03/03/2019)   [place]    Feeling of Stress : Not at all  Social Connections: Moderately Isolated (03/03/2019)   Social Connection and Isolation Panel [NHANES]    Frequency of Communication with Friends and Family: More than three times a week    Frequency of Social Gatherings with Friends and Family: More than  three times a week    Attends Religious Services: 1 to 4 times per year    Active Member of [employer] or Organizations: No    Attends Archivist Meetings: Never    Marital Status: Widowed     Family History:  The patient's family history includes Heart failure in her father; Lung cancer in her mother. There is no history of Breast cancer.  ROS:   12-point review of systems is negative unless otherwise noted in the HPI.   EKGs/Labs/Other Studies Reviewed:    Studies reviewed were summarized above. The additional studies were reviewed today:  2D echo 02/01/2021: 1. Left ventricular ejection fraction, by estimation, is 60 to 65%. The  left ventricle has normal function. The left ventricle has no regional  wall motion abnormalities. Left ventricular diastolic parameters are  consistent with Grade II diastolic  dysfunction (pseudonormalization).   2. Right ventricular systolic function is normal. The right ventricular  size is normal. There is mildly elevated pulmonary artery systolic  pressure. The estimated right ventricular systolic pressure is 65.0 mmHg.   3. The mitral valve is normal in structure. Mild mitral valve  regurgitation. No evidence of mitral stenosis.   4. Tricuspid valve regurgitation is mild to moderate.   5. The aortic valve is normal in structure. Aortic valve regurgitation is  mild. Mild aortic valve sclerosis is present, with no evidence of aortic  valve stenosis.   6. The inferior vena cava is dilated in size with >50% respiratory  variability, suggesting right atrial pressure of 8 mmHg.  __________  2D echo 09/01/2019:  1. Left ventricular ejection fraction, by estimation, is 55 to 60%. The  left ventricle has normal function. The left ventricle has no regional  wall motion abnormalities. There is mild left ventricular hypertrophy.  Left ventricular diastolic parameters  were normal.   2. Right ventricular systolic function is normal. The right  ventricular  size is normal. There is mildly elevated pulmonary artery systolic  pressure. The estimated right ventricular systolic pressure is 35.4 mmHg.   3. The mitral valve is normal in structure. No evidence of mitral valve  regurgitation. No evidence of mitral stenosis.   4. Tricuspid valve regurgitation is mild to moderate.   5. The aortic valve is normal in structure. Aortic valve regurgitation is  not visualized. No aortic stenosis is present.   6. The inferior vena cava is normal in size with <50% respiratory  variability, suggesting right atrial pressure of 8 mmHg. __________  [facility] MPI 01/13/2019: There was no ST segment deviation noted during stress. No T wave inversion was noted during stress. The study is normal. This is a low risk study. The left ventricular ejection fraction is normal (55-65%). __________  2D echo 01/11/2019: 1. The left ventricle has normal systolic function with an ejection  fraction of 60-65%. The cavity size was normal. Left ventricular diastolic  Doppler parameters are consistent with pseudonormalization.   2. The right ventricle has normal systolic function. The cavity was  normal. There is no increase in right ventricular wall thickness. Right  ventricular systolic pressure is moderately elevated with an estimated  pressure of 51.0 mmHg.   3. The inferior vena cava was dilated in size with >50% respiratory  variability.   EKG:  EKG is ordered today.  The EKG ordered today demonstrates NSR, 83 bpm, low voltage QRS, nonspecific ST-T changes  Recent Labs: No results found for requested labs within last 365 days.  Recent Lipid Panel    Component Value Date/Time   CHOL 187 01/13/2019 1238   TRIG 84 01/13/2019 1238   HDL 69 01/13/2019 1238   CHOLHDL 2.7 01/13/2019 1238   VLDL 17 01/13/2019 1238   LDLCALC 101 (H) 01/13/2019 1238    PHYSICAL EXAM:    VS:  BP 128/82   Pulse 83   Ht 5' (1.524 m)   Wt 162 lb 2 oz (73.5 kg)   SpO2 93%    BMI 31.66 kg/m   BMI: Body mass index is 31.66 kg/m.  Physical Exam Vitals reviewed.  Constitutional:      Appearance: She is well-developed.  HENT:     Head: Normocephalic and atraumatic.  Eyes:     General:        Right eye: No discharge.        Left eye: No discharge.  Neck:     Vascular: No JVD.  Cardiovascular:     Rate and Rhythm: Normal rate and regular rhythm.     Heart sounds: Normal heart sounds, S1 normal and S2 normal. Heart sounds not distant. No midsystolic click and no opening snap. No murmur heard.    No friction rub.  Pulmonary:     Effort: Pulmonary effort is normal. No respiratory distress.     Breath sounds: Normal breath sounds. No decreased breath sounds, wheezing or rales.  Chest:     Chest wall: No tenderness.  Abdominal:     General: There is no distension.  Musculoskeletal:     Cervical back: Normal range of motion.     Right lower leg: No edema.     Left lower leg: No edema.  Skin:    General: Skin is warm and dry.     Nails: There is no clubbing.  Neurological:     Mental Status: She is alert and oriented to person, place, and time.  Psychiatric:        Speech: Speech normal.        Behavior: Behavior normal.        Thought Content: Thought content normal.        Judgment: Judgment normal.  Wt Readings from Last 3 Encounters:  02/24/22 162 lb 2 oz (73.5 kg)  02/15/22 166 lb 6.4 oz (75.5 kg)  10/17/21 158 lb (71.7 kg)     ASSESSMENT & PLAN:   HFpEF: She appears euvolemic and well compensated on low-dose furosemide.  Blood pressure is well controlled on recheck.  Obtain updated labs through PCPs office.  HTN: Blood pressure is well controlled on recheck.  She remains on losartan.  HLD: We will obtain labs from her PCPs office.  She remains on atorvastatin 20 mg daily with a target LDL of less than 70 given underlying PAD.  PAD: Mildly reduced ABI with possible inflow disease noted on prior noninvasive imaging.  She is without  symptoms of claudication or nonhealing wounds.  She is mostly limited by chronic low back pain.  Continue medical therapy.  Pulmonary hypertension/COPD/chronic hypoxic respiratory failure with nocturnal supplemental oxygen: Stable.  Follow-up with pulmonology.   Disposition: F/u with Dr. Fletcher Anon or an APP in 6 months.   Medication Adjustments/Labs and Tests Ordered: Current medicines are reviewed at length with the patient today.  Concerns regarding medicines are outlined above. Medication changes, Labs and Tests ordered today are summarized above and listed in the Patient Instructions accessible in Encounters.   Signed, Christell Faith, PA-C 02/24/2022 9:19 AM     [address] [place], [place] [address] [phone]

## 2022-02-24 ENCOUNTER — Encounter: Payer: Self-pay | Admitting: Physician Assistant

## 2022-02-24 ENCOUNTER — Ambulatory Visit: Payer: Medicare Other | Attending: Cardiovascular Disease | Admitting: Physician Assistant

## 2022-02-24 VITALS — BP 128/82 | HR 83 | Ht 60.0 in | Wt 162.1 lb

## 2022-02-24 DIAGNOSIS — I5032 Chronic diastolic (congestive) heart failure: Secondary | ICD-10-CM

## 2022-02-24 DIAGNOSIS — J9611 Chronic respiratory failure with hypoxia: Secondary | ICD-10-CM

## 2022-02-24 DIAGNOSIS — I10 Essential (primary) hypertension: Secondary | ICD-10-CM

## 2022-02-24 DIAGNOSIS — I272 Pulmonary hypertension, unspecified: Secondary | ICD-10-CM

## 2022-02-24 DIAGNOSIS — E785 Hyperlipidemia, unspecified: Secondary | ICD-10-CM

## 2022-02-24 DIAGNOSIS — J449 Chronic obstructive pulmonary disease, unspecified: Secondary | ICD-10-CM

## 2022-02-24 DIAGNOSIS — I739 Peripheral vascular disease, unspecified: Secondary | ICD-10-CM | POA: Diagnosis not present

## 2022-02-24 NOTE — Patient Instructions (Signed)
Medication Instructions:  No changes at this time.   *If you need a refill on your cardiac medications before your next appointment, please call your pharmacy*   Lab Work: None  If you have labs (blood work) drawn today and your tests are completely normal, you will receive your results only by: [place] (if you have MyChart) OR A paper copy in the mail If you have any lab test that is abnormal or we need to change your treatment, we will call you to review the results.   Testing/Procedures: None   Follow-Up: At [facility], you and your health needs are our priority.  As part of our continuing mission to provide you with exceptional heart care, we have created designated Provider Care Teams.  These Care Teams include your primary Cardiologist (physician) and Advanced Practice Providers (APPs -  Physician Assistants and Nurse Practitioners) who all work together to provide you with the care you need, when you need it.  We recommend signing up for the patient portal called "MyChart".  Sign up information is provided on this After Visit Summary.  MyChart is used to connect with patients for Virtual Visits (Telemedicine).  Patients are able to view lab/test results, encounter notes, upcoming appointments, etc.  Non-urgent messages can be sent to your provider as well.   To learn more about what you can do with MyChart, go to [email or web address].    Your next appointment:   6 month(s)  The format for your next appointment:   In Person  Provider:   Kathlyn Sacramento, MD or Christell Faith, PA-C      Important Information About Sugar

## 2022-03-03 ENCOUNTER — Encounter: Payer: Self-pay | Admitting: Cardiovascular Disease

## 2022-04-16 ENCOUNTER — Other Ambulatory Visit: Payer: Self-pay | Admitting: Pulmonary Disease

## 2022-04-16 ENCOUNTER — Other Ambulatory Visit: Payer: Self-pay | Admitting: Cardiovascular Disease

## 2022-05-10 ENCOUNTER — Other Ambulatory Visit: Payer: Self-pay | Admitting: Cardiovascular Disease

## 2022-08-08 ENCOUNTER — Ambulatory Visit: Payer: Medicare Other | Attending: Pulmonary Disease

## 2022-08-08 DIAGNOSIS — Z87891 Personal history of nicotine dependence: Secondary | ICD-10-CM | POA: Diagnosis not present

## 2022-08-08 DIAGNOSIS — R0609 Other forms of dyspnea: Secondary | ICD-10-CM | POA: Diagnosis not present

## 2022-08-08 DIAGNOSIS — J4489 Other specified chronic obstructive pulmonary disease: Secondary | ICD-10-CM | POA: Insufficient documentation

## 2022-08-08 LAB — PULMONARY FUNCTION TEST ARMC ONLY
DL/VA % pred: 46 %
DL/VA: 1.97 ml/min/mmHg/L
DLCO unc % pred: 42 %
DLCO unc: 6.99 ml/min/mmHg
FEF 25-75 Post: 0.42 L/sec
FEF 25-75 Pre: 0.31 L/sec
FEF2575-%Change-Post: 35 %
FEF2575-%Pred-Post: 29 %
FEF2575-%Pred-Pre: 21 %
FEV1-%Change-Post: 14 %
FEV1-%Pred-Post: 50 %
FEV1-%Pred-Pre: 44 %
FEV1-Post: 0.88 L
FEV1-Pre: 0.77 L
FEV1FVC-%Change-Post: 6 %
FEV1FVC-%Pred-Pre: 63 %
FEV6-%Change-Post: 7 %
FEV6-%Pred-Post: 77 %
FEV6-%Pred-Pre: 71 %
FEV6-Post: 1.71 L
FEV6-Pre: 1.59 L
FEV6FVC-%Change-Post: 0 %
FEV6FVC-%Pred-Post: 102 %
FEV6FVC-%Pred-Pre: 102 %
FVC-%Change-Post: 7 %
FVC-%Pred-Post: 75 %
FVC-%Pred-Pre: 69 %
FVC-Post: 1.76 L
FVC-Pre: 1.63 L
Post FEV1/FVC ratio: 50 %
Post FEV6/FVC ratio: 97 %
Pre FEV1/FVC ratio: 47 %
Pre FEV6/FVC Ratio: 97 %
RV % pred: 169 %
RV: 3.54 L
TLC % pred: 122 %
TLC: 5.44 L

## 2022-08-08 MED ORDER — ALBUTEROL SULFATE (2.5 MG/3ML) 0.083% IN NEBU
2.5000 mg | INHALATION_SOLUTION | Freq: Once | RESPIRATORY_TRACT | Status: AC
  Administered 2022-08-08: 2.5 mg via RESPIRATORY_TRACT
  Filled 2022-08-08: qty 3

## 2022-08-15 ENCOUNTER — Ambulatory Visit: Payer: Medicare Other | Admitting: Pulmonary Disease

## 2022-08-15 ENCOUNTER — Encounter: Payer: Self-pay | Admitting: Pulmonary Disease

## 2022-08-15 VITALS — BP 138/80 | HR 81 | Temp 97.1°F | Ht 60.0 in | Wt 168.0 lb

## 2022-08-15 DIAGNOSIS — I5032 Chronic diastolic (congestive) heart failure: Secondary | ICD-10-CM | POA: Diagnosis not present

## 2022-08-15 DIAGNOSIS — J439 Emphysema, unspecified: Secondary | ICD-10-CM

## 2022-08-15 DIAGNOSIS — J4489 Other specified chronic obstructive pulmonary disease: Secondary | ICD-10-CM

## 2022-08-15 DIAGNOSIS — J449 Chronic obstructive pulmonary disease, unspecified: Secondary | ICD-10-CM

## 2022-08-15 DIAGNOSIS — I2723 Pulmonary hypertension due to lung diseases and hypoxia: Secondary | ICD-10-CM

## 2022-08-15 DIAGNOSIS — G4736 Sleep related hypoventilation in conditions classified elsewhere: Secondary | ICD-10-CM

## 2022-08-15 MED ORDER — TRELEGY ELLIPTA 100-62.5-25 MCG/ACT IN AEPB: 1.0000 | INHALATION_SPRAY | Freq: Every day | RESPIRATORY_TRACT | 0 refills | Status: DC

## 2022-08-15 NOTE — Progress Notes (Signed)
Subjective:    Patient ID: Carolyn Steele, female    DOB: 09/24/1945, 77 y.o.   MRN: [id number] Patient Care Team: Jaclyn Shaggy, MD as PCP - General (Internal Medicine) Iran Ouch, MD as PCP - Cardiology (Cardiology)  Chief Complaint  Patient presents with   Follow-up    SOB with exertion. Dry cough. No wheezing.    HPI Patient is a 77 year old former smoker, with a 40-pack-year history of smoking, who presents for follow-up on the issue of COPD with asthma overlap and pulmonary hypertension due to diastolic dysfunction and COPD. This is a scheduled visit.  Patient presents today with no overt complaint.  She has been compliant with nocturnal oxygen.  She notes good quality of sleep and wakes up refreshed in the mornings.  Notices also less dyspnea during the day.  She has been compliant with Trelegy which continues to help her with her COPD/asthma symptoms.  No major flares of asthma and or allergies lately. She uses Allegra-D and Flonase to control allergy symptoms and only uses these seasonally so far, has not needed them this season.  She does not note increased dyspnea.  She can still perform her activities of daily living provided she "paces herself".  She is still able to maintain a small garden in the home.  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  She is able to assist her without much difficulty.  She has had no cough, sputum production or hemoptysis. No chest pain, orthopnea or paroxysmal nocturnal dyspnea.  She has mild lower extremity edema that responds to diuretics.  No calf tenderness. Overall she feels well and looks well  The patient had pulmonary function testing on 08 August 2022 and these show that her lung function is relatively stable from 2021.  Data as below.   DATA 01/10/2019 CT angio chest: No PE, bilateral pleural effusions, small, right lung compressive atelectasis, cardiomegaly, centrilobular emphysema.  There is also  moderate hiatal hernia 08/28/2019 overnight oximetry: Desaturations to as low as 83%, patient instructed to use 2 L/min nocturnally 09/01/2019 PFTs: FEV1 0.75 L or 40% predicted FEV1/FVC 46%, FVC 1.63 L or 66% predicted. Post bronchodilator patient had 34% change in FEV1 and 27% change in FVC indicating asthmatic component. Diffusion capacity moderately to severely reduced.  Consistent with severe COPD with asthmatic component 09/01/2019 2D echo: LVEF is 55 to 60%, mild LVH, mild elevation of the pulmonary artery systolic pressure not determined, tricuspid regurgitation mild to moderate 02/01/2021 echocardiogram: LVEF 60 to 65%, Grade II DD, mild elevation in pulmonary systolic pressure, RV systolic pressure 44.6 mmHg. 08/08/2022 PFTs: FEV1 0.77 L or 44% predicted, FVC 1.63 L or 69% predicted, FEV1/FVC 47%.  There is a significant bronchodilator response with FEV1 increasing 14% postbronchodilator.  Lung volumes show air trapping and hyperinflation.  Diffusion capacity is severely reduced. 08/15/2022 ambulatory oximetry: Patient able to ambulate 750 feet, moderate dyspnea, O2 sat nadir 91%.  Review of Systems A 10 point review of systems was performed and it is as noted above otherwise negative.  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 08/30/2020   COPD with asthma 01/12/2020   Acute on chronic diastolic heart failure    CHF (congestive heart failure) 01/10/2019   Social History   Tobacco Use   Smoking status: Former    Packs/day: 1.00    Years:  40.00    Additional pack years: 0.00    Total pack years: 40.00    Types: Cigarettes    Quit date: 2010    Years since quitting: 14.2   Smokeless tobacco: Never   Tobacco comments:    quit smoking approximately 10-12 years ago (05/28/2015)  Substance Use Topics   Alcohol use: No   Allergies  Allergen Reactions   Celebrex  [Celecoxib] Swelling   Mobic [Meloxicam] Swelling   Adhesive [Tape] Itching and Rash   Silicone Itching and Rash   Current Meds  Medication Sig   albuterol (VENTOLIN HFA) 108 (90 Base) MCG/ACT inhaler USE 2 INHALATIONS BY MOUTH EVERY 6 HOURS AS NEEDED FOR WHEEZING  OR SHORTNESS OF BREATH   Ascorbic Acid (VITAMIN C PO) Take by mouth daily.   aspirin 81 MG tablet Take 81 mg by mouth daily.   atorvastatin (LIPITOR) 20 MG tablet TAKE 1 TABLET BY MOUTH ONCE  DAILY   Calcium Carb-Cholecalciferol (CALCIUM 600 + D PO) Take 1 tablet by mouth daily.   Cranberry-Vitamin C 140-100 MG CAPS Take 2 capsules by mouth daily.    fexofenadine (ALLEGRA) 180 MG tablet Take 180 mg by mouth daily.   Fluticasone-Umeclidin-Vilant (TRELEGY ELLIPTA) 100-62.5-25 MCG/ACT AEPB Inhale 1 puff into the lungs daily.   furosemide (LASIX) 20 MG tablet Take 1 tablet (20 mg total) by mouth daily.   gabapentin (NEURONTIN) 300 MG capsule Take 300 mg by mouth daily.   Glucosamine HCl 1500 MG TABS Take 1 tablet by mouth daily.   guaifenesin (HUMIBID E) 400 MG TABS tablet Take 400 mg by mouth daily.   levothyroxine (SYNTHROID, LEVOTHROID) 150 MCG tablet Take 150 mcg by mouth daily before breakfast. Take 1 tablet M, T, Thurs, Fri and Sat. Take 1/2 tablet Wednesday and Skip Sunday.   losartan (COZAAR) 100 MG tablet TAKE 1 TABLET BY MOUTH DAILY   Multiple Vitamins-Minerals (MULTIVITAMIN & MINERAL PO) Take 1 tablet by mouth daily.   Multiple Vitamins-Minerals (PRESERVISION AREDS 2) CAPS in the morning and at bedtime.   Omega-3 Fatty Acids (FISH OIL) 1000 MG CAPS Take 500 mg by mouth daily.    omeprazole (PRILOSEC) 40 MG capsule Take 40 mg by mouth daily.   OXYGEN Inhale 2 L into the lungs daily.   traMADol (ULTRAM) 50 MG tablet Take 50 mg by mouth every 6 (six) hours as needed for moderate pain.    TRELEGY ELLIPTA 100-62.5-25 MCG/ACT AEPB USE 1 INHALATION BY MOUTH  ONCE DAILY AT THE SAME TIME EACH DAY   VITAMIN E PO Take by mouth  daily.   Immunization History  Administered Date(s) Administered   Fluad Quad(high Dose 65+) 01/30/2019   Influenza-Unspecified 02/16/2020   Zoster Recombinat (Shingrix) 06/17/2018, 11/20/2018       Objective:   Physical Exam BP 138/80 (BP Location: Left Arm, Cuff Size: Normal)   Pulse 81   Temp (!) 97.1 F (36.2 C)   Ht 5' (1.524 m)   Wt 168 lb (76.2 kg)   SpO2 97%   BMI 32.81 kg/m   SpO2: 97 % O2 Device: None (Room air)  GENERAL: Frail-appearing woman, no acute distress, fully ambulatory.  Uses cane for ambulating assistance. HEAD: Normocephalic, atraumatic.  EYES: Pupils equal, round, reactive to light.  No scleral icterus.  MOUTH: Wears dentures uppers and lowers NECK: Supple. No thyromegaly. Trachea midline.  Prominent A waves.  No adenopathy. PULMONARY: Increased AP diameter.  Good air entry bilaterally.  Coarse breath sounds, otherwise, no adventitious sounds.  CARDIOVASCULAR: S1 and S2. Regular rate and rhythm.  No rubs, murmurs or gallops heard. ABDOMEN: Benign. MUSCULOSKELETAL: Significant kyphosis.  OA changes both hands, no clubbing, trace edema.  NEUROLOGIC: No focal deficit, speech is fluent, gait without disturbance though does use cane for steadying. SKIN: Intact,warm,dry.  Mild stasis changes lower extremities. PSYCH: Mood and behavior are normal.   Ambulatory oximetry was performed today: Patient ambulated 750 feet.  Heart rate at rest was 85 bpm.  O2 sat at rest was 92%.  Patient maintained O2 sats between 93% to 91%.  No further desaturations noted.  Heart rate at maximum exercise was 110 bpm.  Patient exhibited mild to moderate dyspnea during that testing.    Assessment & Plan:     ICD-10-CM   1. COPD with asthma  J44.89    COPD on the basis of emphysema Asthmatic component (reversibility) Continue Trelegy 100 Continue as needed albuterol Well compensated    2. Nocturnal hypoxemia due to emphysema  J43.9    G47.36    Patient compliant with  oxygen at 2 L/min nocturnally Patient notes benefit from therapy Continue supplementation    3. Chronic diastolic heart failure  I50.32    This issue adds complexity to her management Follows with cardiology    4. Pulmonary hypertension due to COPD  I27.23    J44.9    Continue nocturnal oxygen supplementation Diastolic dysfunction also adds to pulmonary hypertension issues     Meds ordered this encounter  Medications   Fluticasone-Umeclidin-Vilant (TRELEGY ELLIPTA) 100-62.5-25 MCG/ACT AEPB    Sig: Inhale 1 puff into the lungs daily.    Dispense:  28 each    Refill:  0    Order Specific Question:   Lot Number?    Answer:   [id number]    Order Specific Question:   Expiration Date?    Answer:   11/30/2023    Order Specific Question:   Quantity    Answer:   2   Overall Carolyn Steele is fairly well compensated with regards to her COPD/asthma.  The Trelegy.  She does have issues with nocturnal hypoxemia and is compliant with oxygen and notes benefit from the therapy.  Continue same.  We will see Carolyn Steele in follow-up in 3 to 4 months time she is to contact us prior to that time should any new difficulties arise.   Gailen Shelter, MD [facility]    *This note was dictated using voice recognition software/Dragon.  Despite best efforts to proofread, errors can occur which can change the meaning. Any transcriptional errors that result from this process are unintentional and may not be fully corrected at the time of dictation.

## 2022-08-15 NOTE — Patient Instructions (Signed)
Your oxygen level maintained fairly well during your walk today.  Your lung function is fairly stable from 2021.  Continue taking your Trelegy.  We have provided you with some samples today.  We will see you in follow-up in 3 to 4 months time call sooner should any new problems arise.

## 2022-08-29 ENCOUNTER — Other Ambulatory Visit: Payer: Self-pay | Admitting: Pulmonary Disease

## 2022-09-27 ENCOUNTER — Ambulatory Visit: Payer: Medicare Other | Admitting: Physician Assistant

## 2022-10-17 ENCOUNTER — Other Ambulatory Visit: Payer: Self-pay | Admitting: Cardiovascular Disease

## 2022-10-24 NOTE — Progress Notes (Signed)
Cardiology Office Note    Date:  10/26/2022   ID:  Carolyn Steele, DOB 04/21/1946, MRN [id number]  PCP:  Carolyn Shaggy, MD  Cardiologist:  Carolyn Bears, MD  Electrophysiologist:  None   Chief Complaint: Follow up  History of Present Illness:   Carolyn Steele is a 77 y.o. female with history of HFpEF, chronic hypoxic respiratory failure on nocturnal home oxygen, COPD, PAD medically managed, HTN, HLD, hypothyroidism, chronic back pain status post back stimulator, and GERD who presents for follow-up of HFpEF.   She was admitted to the hospital in 12/2018 with heart failure in the setting of uncontrolled hypertension.  Echo at that time showed normal LV systolic function with evidence of diastolic dysfunction.  Troponin was mildly elevated and felt to be related to supply demand ischemia.  Stress testing showed no evidence of ischemia or infarct.  Echo in 08/2019 showed normal LV systolic function, normal diastolic function, mild to moderate tricuspid regurgitation with mild pulmonary hypertension with an estimated PASP of 45 mmHg.  Noninvasive lower extremity imaging in 08/2019 showed an ABI of 0.75 on the right and 0.95 on the left.  Duplex showed significant iliac disease, worse on the right.  She has been medically managed in the context of no claudication.  She was admitted to [facility] in 12/2019 with a large pneumothorax after she fractured her ribs from a fall and required chest tube placement.  She was evaluated in our office in 01/2021 with elevated BP leading to titration of losartan.  Echo done at that time showed normal LV systolic function, grade 2 diastolic dysfunction, mild to moderate pulmonary hypertension, mild mitral regurgitation, and mild to moderate tricuspid regurgitation.  She was last seen in the office in 01/2022 and remained without symptoms of angina or cardiac decompensation, noting some fatigue if she had to ambulate for extended timeframe without being able to hold onto  something to assist with balance.  If she had a something to assist with balance, she was asymptomatic.  No changes were indicated at that time.  She comes in doing well from a cardiac perspective and is without symptoms of angina or cardiac decompensation.  No dizziness, presyncope, or syncope.  No lower extremity swelling or orthopnea.  No symptoms of claudication or critical limb ischemia.  Continues to ambulate with a walker.  No falls since her last visit.  Under increased stress with the health of her daughter who is currently admitted to [facility] with cellulitis and lymphedema.  Tolerating cardiac medications without issues.  No bleeding concerns.  Overall, she feels well and does not have any acute cardiac concerns at this time.   Labs independently reviewed: 03/2022 - BUN 21, serum creatinine 0.5, potassium 5.0, TSH normal, TC 196, TG 88, HDL 84, LDL 96 06/2021 - albumin 4.3, AST/ALT normal 12/2019 - Hgb 11.5, PLT 355  Past Medical History:  Diagnosis Date   (HFpEF) heart failure with preserved ejection fraction (HCC)    a. 12/2018 Echo: EF 60-65%, pseudonormalization. Nl RV fxn. RVSP 51 mmHg. Mild MR/TR; b. 01/2021 Echo: EF 60-65%, no rwma, GrII DD. Nl RV size/fxn. RVSP 44.79mmHg. Mild MR. Mild-mod TR. Mild AoV sclerosis w/o stenosis.   Acid reflux    Arthritis    Asthma    COPD (chronic obstructive pulmonary disease) (HCC)    a. Home O2 started 12/2018.   Demand ischemia    a. 12/2018 mild hsTrop elevation in setting of diast chf/htn urgency-->MV: no ischemia/infarct. EF 55-65%.  Depression    Hypercholesteremia    Hypertension    Hypothyroidism    Wears glasses     Past Surgical History:  Procedure Laterality Date   CHOLECYSTECTOMY     COLONOSCOPY     COLONOSCOPY WITH PROPOFOL N/A 09/21/2021   Procedure: COLONOSCOPY WITH PROPOFOL;  Surgeon: Earline Mayotte, MD;  Location: [facility] ENDOSCOPY;  Service: Endoscopy;  Laterality: N/A;   FOOT SURGERY Right    HAND SURGERY Bilateral     SHOULDER SURGERY Left    SPINAL CORD STIMULATOR INSERTION N/A 06/04/2015   Procedure: LUMBAR SPINAL CORD STIMULATOR INSERTION;  Surgeon: Odette Fraction, MD;  Location: [facility]UR[facility]S;  Service: Neurosurgery;  Laterality: N/A;    Current Medications: Current Meds  Medication Sig   albuterol (VENTOLIN HFA) 108 (90 Base) MCG/ACT inhaler USE 2 INHALATIONS BY MOUTH EVERY 6 HOURS AS NEEDED FOR WHEEZING  OR SHORTNESS OF BREATH   Ascorbic Acid (VITAMIN C PO) Take by mouth daily.   aspirin 81 MG tablet Take 81 mg by mouth daily.   atorvastatin (LIPITOR) 20 MG tablet TAKE 1 TABLET BY MOUTH ONCE  DAILY   Calcium Carb-Cholecalciferol (CALCIUM 600 + D PO) Take 1 tablet by mouth daily.   Cranberry-Vitamin C 140-100 MG CAPS Take 2 capsules by mouth daily.    fexofenadine (ALLEGRA) 180 MG tablet Take 180 mg by mouth daily.   furosemide (LASIX) 20 MG tablet Take 1 tablet (20 mg total) by mouth daily.   gabapentin (NEURONTIN) 300 MG capsule Take 300 mg by mouth daily.   Glucosamine HCl 1500 MG TABS Take 1 tablet by mouth daily.   guaifenesin (HUMIBID E) 400 MG TABS tablet Take 400 mg by mouth daily.   levothyroxine (SYNTHROID, LEVOTHROID) 150 MCG tablet Take 150 mcg by mouth daily before breakfast. Take 1 tablet M, T, Thurs, Fri and Sat. Take 1/2 tablet Wednesday and Skip Sunday.   losartan (COZAAR) 100 MG tablet TAKE 1 TABLET BY MOUTH DAILY   Multiple Vitamins-Minerals (MULTIVITAMIN & MINERAL PO) Take 1 tablet by mouth daily.   Multiple Vitamins-Minerals (PRESERVISION AREDS 2) CAPS in the morning and at bedtime.   omeprazole (PRILOSEC) 40 MG capsule Take 40 mg by mouth daily.   OXYGEN Inhale 2 L into the lungs at bedtime.   traMADol (ULTRAM) 50 MG tablet Take 50 mg by mouth every 6 (six) hours as needed for moderate pain.    TRELEGY ELLIPTA 100-62.5-25 MCG/ACT AEPB USE 1 INHALATION BY MOUTH ONCE  DAILY AT THE SAME TIME EACH DAY   VITAMIN E PO Take by mouth daily.    Allergies:   Celebrex [celecoxib], Mobic  [meloxicam], Adhesive [tape], and Silicone   Social History   Socioeconomic History   Marital status: Widowed    Spouse name: Not on file   Number of children: Not on file   Years of education: Not on file   Highest education level: Not on file  Occupational History   Not on file  Tobacco Use   Smoking status: Former    Packs/day: 1.00    Years: 40.00    Additional pack years: 0.00    Total pack years: 40.00    Types: Cigarettes    Quit date: 2010    Years since quitting: 14.4   Smokeless tobacco: Never   Tobacco comments:    quit smoking approximately 10-12 years ago (05/28/2015)  Vaping Use   Vaping Use: Never used  Substance and Sexual Activity   Alcohol use: No  Drug use: No   Sexual activity: Not on file  Other Topics Concern   Not on file  Social History Narrative   Not on file   Social Determinants of Health   Financial Resource Strain: Low Risk  (03/03/2019)   Overall Financial Resource Strain (CARDIA)    Difficulty of Paying Living Expenses: Not hard at all  Food Insecurity: No Food Insecurity (03/03/2019)   Hunger Vital Sign    Worried About Running Out of Food in the Last Year: Never true    Ran Out of Food in the Last Year: Never true  Transportation Needs: No Transportation Needs (03/03/2019)   PRAPARE - Administrator, Civil Service (Medical): No    Lack of Transportation (Non-Medical): No  Physical Activity: Insufficiently Active (03/03/2019)   Exercise Vital Sign    Days of Exercise per Week: 7 days    Minutes of Exercise per Session: 20 min  Stress: No Stress Concern Present (03/03/2019)   [employer] of Occupational Health - Occupational Stress Questionnaire    Feeling of Stress : Not at all  Social Connections: Moderately Isolated (03/03/2019)   Social Connection and Isolation Panel [NHANES]    Frequency of Communication with Friends and Family: More than three times a week    Frequency of Social Gatherings with Friends and  Family: More than three times a week    Attends Religious Services: 1 to 4 times per year    Active Member of [employer] or Organizations: No    Attends Banker Meetings: Never    Marital Status: Widowed     Family History:  The patient's family history includes Heart failure in her father; Lung cancer in her mother. There is no history of Breast cancer.  ROS:   12-point review of systems is negative unless otherwise noted in the HPI.   EKGs/Labs/Other Studies Reviewed:    Studies reviewed were summarized above. The additional studies were reviewed today:  2D echo 02/01/2021: 1. Left ventricular ejection fraction, by estimation, is 60 to 65%. The  left ventricle has normal function. The left ventricle has no regional  wall motion abnormalities. Left ventricular diastolic parameters are  consistent with Grade II diastolic  dysfunction (pseudonormalization).   2. Right ventricular systolic function is normal. The right ventricular  size is normal. There is mildly elevated pulmonary artery systolic  pressure. The estimated right ventricular systolic pressure is 44.6 mmHg.   3. The mitral valve is normal in structure. Mild mitral valve  regurgitation. No evidence of mitral stenosis.   4. Tricuspid valve regurgitation is mild to moderate.   5. The aortic valve is normal in structure. Aortic valve regurgitation is  mild. Mild aortic valve sclerosis is present, with no evidence of aortic  valve stenosis.   6. The inferior vena cava is dilated in size with >50% respiratory  variability, suggesting right atrial pressure of 8 mmHg.  __________   2D echo 09/01/2019:  1. Left ventricular ejection fraction, by estimation, is 55 to 60%. The  left ventricle has normal function. The left ventricle has no regional  wall motion abnormalities. There is mild left ventricular hypertrophy.  Left ventricular diastolic parameters  were normal.   2. Right ventricular systolic function is  normal. The right ventricular  size is normal. There is mildly elevated pulmonary artery systolic  pressure. The estimated right ventricular systolic pressure is 45.0 mmHg.   3. The mitral valve is normal in structure. No evidence of mitral  valve  regurgitation. No evidence of mitral stenosis.   4. Tricuspid valve regurgitation is mild to moderate.   5. The aortic valve is normal in structure. Aortic valve regurgitation is  not visualized. No aortic stenosis is present.   6. The inferior vena cava is normal in size with <50% respiratory  variability, suggesting right atrial pressure of 8 mmHg. __________   Eugenie Birks MPI 01/13/2019: There was no ST segment deviation noted during stress. No T wave inversion was noted during stress. The study is normal. This is a low risk study. The left ventricular ejection fraction is normal (55-65%). __________   2D echo 01/11/2019: 1. The left ventricle has normal systolic function with an ejection  fraction of 60-65%. The cavity size was normal. Left ventricular diastolic  Doppler parameters are consistent with pseudonormalization.   2. The right ventricle has normal systolic function. The cavity was  normal. There is no increase in right ventricular wall thickness. Right  ventricular systolic pressure is moderately elevated with an estimated  pressure of 51.0 mmHg.   3. The inferior vena cava was dilated in size with >50% respiratory variability.   EKG:  EKG is ordered today.  The EKG ordered today demonstrates NSR, 74 bpm, no acute ST-T changes  Recent Labs: No results found for requested labs within last 365 days.  Recent Lipid Panel    Component Value Date/Time   CHOL 187 01/13/2019 1238   TRIG 84 01/13/2019 1238   HDL 69 01/13/2019 1238   CHOLHDL 2.7 01/13/2019 1238   VLDL 17 01/13/2019 1238   LDLCALC 101 (H) 01/13/2019 1238    PHYSICAL EXAM:    VS:  BP 116/64 (BP Location: Left Arm, Patient Position: Sitting, Cuff Size: Normal)    Pulse 74   Ht 5' (1.524 m)   Wt 166 lb 6.4 oz (75.5 kg)   SpO2 94%   BMI 32.50 kg/m   BMI: Body mass index is 32.5 kg/m.  Physical Exam Vitals reviewed.  Constitutional:      Appearance: She is well-developed.  HENT:     Head: Normocephalic and atraumatic.  Eyes:     General:        Right eye: No discharge.        Left eye: No discharge.  Neck:     Vascular: No JVD.  Cardiovascular:     Rate and Rhythm: Normal rate and regular rhythm.     Heart sounds: Normal heart sounds, S1 normal and S2 normal. Heart sounds not distant. No midsystolic click and no opening snap. No murmur heard.    No friction rub.  Pulmonary:     Effort: Pulmonary effort is normal. No respiratory distress.     Breath sounds: Normal breath sounds. No decreased breath sounds, wheezing or rales.  Chest:     Chest wall: No tenderness.  Abdominal:     General: There is no distension.  Musculoskeletal:     Cervical back: Normal range of motion.     Right lower leg: No edema.     Left lower leg: No edema.  Skin:    General: Skin is warm and dry.     Nails: There is no clubbing.  Neurological:     Mental Status: She is alert and oriented to person, place, and time.  Psychiatric:        Speech: Speech normal.        Behavior: Behavior normal.        Thought Content: Thought content  normal.        Judgment: Judgment normal.     Wt Readings from Last 3 Encounters:  10/26/22 166 lb 6.4 oz (75.5 kg)  08/15/22 168 lb (76.2 kg)  02/24/22 162 lb 2 oz (73.5 kg)     ASSESSMENT & PLAN:   HFpEF: Euvolemic and well compensated on low-dose furosemide.  Blood pressure well-controlled.  In an effort to minimize off target effects, we will defer addition of MRA or SGLT2 inhibitor.  HTN: Blood pressure is well-controlled in the office today.  She remains on losartan.  HLD: 96 in 03/2022.  She remains on atorvastatin 20 mg.  Followed by PCP.  PAD: Mildly reduced ABI with possible inflow disease noted on prior  noninvasive imaging.  She is without symptoms of claudication or critical limb ischemia.  Mostly limited by chronic low back pain.  Continue medical therapy and walking regimen as tolerated.  Pulmonary hypertension/COPD/chronic hypoxic respiratory failure with nocturnal supplemental oxygen: Stable.  Follow-up with pulmonology.   Disposition: F/u with Dr. Kirke Corin or an APP in 6 months.   Medication Adjustments/Labs and Tests Ordered: Current medicines are reviewed at length with the patient today.  Concerns regarding medicines are outlined above. Medication changes, Labs and Tests ordered today are summarized above and listed in the Patient Instructions accessible in Encounters.   Signed, Eula Listen, PA-C 10/26/2022 9:00 AM     [facility] [place] [address] [place], [place] [address] [phone]

## 2022-10-26 ENCOUNTER — Ambulatory Visit: Payer: Medicare Other | Attending: Physician Assistant | Admitting: Physician Assistant

## 2022-10-26 ENCOUNTER — Encounter: Payer: Self-pay | Admitting: Physician Assistant

## 2022-10-26 VITALS — BP 116/64 | HR 74 | Ht 60.0 in | Wt 166.4 lb

## 2022-10-26 DIAGNOSIS — I739 Peripheral vascular disease, unspecified: Secondary | ICD-10-CM | POA: Diagnosis not present

## 2022-10-26 DIAGNOSIS — E785 Hyperlipidemia, unspecified: Secondary | ICD-10-CM | POA: Diagnosis not present

## 2022-10-26 DIAGNOSIS — I5032 Chronic diastolic (congestive) heart failure: Secondary | ICD-10-CM

## 2022-10-26 DIAGNOSIS — I10 Essential (primary) hypertension: Secondary | ICD-10-CM

## 2022-10-26 DIAGNOSIS — I272 Pulmonary hypertension, unspecified: Secondary | ICD-10-CM

## 2022-10-26 DIAGNOSIS — J449 Chronic obstructive pulmonary disease, unspecified: Secondary | ICD-10-CM

## 2022-10-26 DIAGNOSIS — J9611 Chronic respiratory failure with hypoxia: Secondary | ICD-10-CM

## 2022-10-26 NOTE — Patient Instructions (Signed)
Medication Instructions:  No changes at this time.   *If you need a refill on your cardiac medications before your next appointment, please call your pharmacy*   Lab Work: None  If you have labs (blood work) drawn today and your tests are completely normal, you will receive your results only by: MyChart Message (if you have MyChart) OR A paper copy in the mail If you have any lab test that is abnormal or we need to change your treatment, we will call you to review the results.   Testing/Procedures: None   Follow-Up: At [facility], you and your health needs are our priority.  As part of our continuing mission to provide you with exceptional heart care, we have created designated Provider Care Teams.  These Care Teams include your primary Cardiologist (physician) and Advanced Practice Providers (APPs -  Physician Assistants and Nurse Practitioners) who all work together to provide you with the care you need, when you need it.  We recommend signing up for the patient portal called "MyChart".  Sign up information is provided on this After Visit Summary.  MyChart is used to connect with patients for Virtual Visits (Telemedicine).  Patients are able to view lab/test results, encounter notes, upcoming appointments, etc.  Non-urgent messages can be sent to your provider as well.   To learn more about what you can do with MyChart, go to [email or web address].    Your next appointment:   6 month(s)  Provider:   Muhammad Arida, MD or Ryan Dunn, PA-C     

## 2022-11-13 ENCOUNTER — Other Ambulatory Visit: Payer: Self-pay | Admitting: Internal Medicine

## 2022-11-13 DIAGNOSIS — Z1231 Encounter for screening mammogram for malignant neoplasm of breast: Secondary | ICD-10-CM

## 2022-11-20 ENCOUNTER — Encounter: Payer: Self-pay | Admitting: Pulmonary Disease

## 2022-11-20 ENCOUNTER — Telehealth: Payer: Self-pay

## 2022-11-20 ENCOUNTER — Ambulatory Visit
Admission: RE | Admit: 2022-11-20 | Discharge: 2022-11-20 | Disposition: A | Discharge status: Home / Self Care | Payer: Medicare Other | Source: Ambulatory Visit | Attending: Pulmonary Disease | Admitting: Pulmonary Disease

## 2022-11-20 ENCOUNTER — Ambulatory Visit: Payer: Medicare Other | Admitting: Pulmonary Disease

## 2022-11-20 VITALS — BP 120/80 | HR 85 | Temp 98.0°F | Ht 60.0 in | Wt 166.8 lb

## 2022-11-20 DIAGNOSIS — I5032 Chronic diastolic (congestive) heart failure: Secondary | ICD-10-CM

## 2022-11-20 DIAGNOSIS — J4489 Other specified chronic obstructive pulmonary disease: Secondary | ICD-10-CM

## 2022-11-20 DIAGNOSIS — J449 Chronic obstructive pulmonary disease, unspecified: Secondary | ICD-10-CM

## 2022-11-20 DIAGNOSIS — G4736 Sleep related hypoventilation in conditions classified elsewhere: Secondary | ICD-10-CM

## 2022-11-20 DIAGNOSIS — J439 Emphysema, unspecified: Secondary | ICD-10-CM | POA: Diagnosis not present

## 2022-11-20 DIAGNOSIS — R0602 Shortness of breath: Secondary | ICD-10-CM

## 2022-11-20 DIAGNOSIS — Z87891 Personal history of nicotine dependence: Secondary | ICD-10-CM

## 2022-11-20 DIAGNOSIS — I2723 Pulmonary hypertension due to lung diseases and hypoxia: Secondary | ICD-10-CM

## 2022-11-20 MED ORDER — TRELEGY ELLIPTA 100-62.5-25 MCG/ACT IN AEPB: 1.0000 | INHALATION_SPRAY | Freq: Every day | RESPIRATORY_TRACT | Status: DC

## 2022-11-20 NOTE — Progress Notes (Unsigned)
Subjective:    Patient ID: Carolyn Steele, female    DOB: 1945/11/21, 77 y.o.   MRN: [id number]  Patient Care Team: Jaclyn Shaggy, MD as PCP - General (Internal Medicine) Iran Ouch, MD as PCP - Cardiology (Cardiology)  Chief Complaint  Patient presents with   Follow-up    DOE. No wheezing. Occasional dry cough.     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  We last saw her on 15 August 2022 and at that time was instructed to continue Trelegy and nocturnal oxygen.  This is a scheduled visit. Patient presents today with no overt complaint.  She has been compliant with nocturnal oxygen.  She notes good quality of sleep and wakes up refreshed in the mornings.  Notices also less dyspnea and fatigue during the day.  She does still have issues with dyspnea on exertion.  No wheezing recently.  She has been compliant with Trelegy which continues to help her with her COPD/asthma symptoms.  She is however having difficulty making co-pays on the Trelegy.  Average albuterol use is twice a week.  No major flares of asthma and or allergies lately. She uses Allegra and Flonase to control allergy symptoms and only uses these seasonally, so far, has not needed them this season.  She does not note increased dyspnea.  She can still perform her activities of daily living provided she "paces herself".  She is still able to maintain a small garden in the home.  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  She is able to assist her without much difficulty.  She has had no cough, sputum production or hemoptysis. No chest pain, orthopnea or paroxysmal nocturnal dyspnea.  She has mild lower extremity edema that responds to diuretics.  No calf tenderness.   We discussed lung cancer  screening as we have before however she continues to decline this.  Overall she feels well and looks well.    DATA 01/10/2019 CT angio chest: No PE, bilateral pleural effusions, small, right lung compressive atelectasis, cardiomegaly, centrilobular emphysema.  There is also moderate hiatal hernia 08/28/2019 overnight oximetry: Desaturations to as low as 83%, patient instructed to use 2 L/min nocturnally 09/01/2019 PFTs: FEV1 0.75 L or 40% predicted FEV1/FVC 46%, FVC 1.63 L or 66% predicted. Post bronchodilator patient had 34% change in FEV1 and 27% change in FVC indicating asthmatic component. Diffusion capacity moderately to severely reduced.  Consistent with severe COPD with asthmatic component 09/01/2019 2D echo: LVEF is 55 to 60%, mild LVH, mild elevation of the pulmonary artery systolic pressure not determined, tricuspid regurgitation mild to moderate 02/01/2021 echocardiogram: LVEF 60 to 65%, Grade II DD, mild elevation in pulmonary systolic pressure, RV systolic pressure 44.6 mmHg. 08/08/2022 PFTs: FEV1 0.77 L or 44% predicted, FVC 1.63 L or 69% predicted, FEV1/FVC 47%.  There is a significant bronchodilator response with FEV1 increasing 14% postbronchodilator.  Lung volumes show air trapping and hyperinflation.  Diffusion capacity is severely reduced. 08/15/2022 ambulatory oximetry: Patient able to ambulate 750 feet, moderate dyspnea, O2 sat nadir 91%.  Review of Systems A 10 point review of systems was performed and it is as noted above otherwise negative.   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) 08/30/2020   COPD with asthma 01/12/2020   Acute on chronic diastolic heart failure (HCC)    CHF (congestive heart failure) (HCC) 01/10/2019    Social History   Tobacco Use   Smoking status: Former    Current packs/day: 0.00    Average packs/day: 1 pack/day for  40.0 years (40.0 ttl pk-yrs)    Types: Cigarettes    Start date: 33    Quit date: 2010    Years since quitting: 14.5   Smokeless tobacco: Never   Tobacco comments:    quit smoking approximately 10-12 years ago (05/28/2015)  Substance Use Topics   Alcohol use: No    Allergies  Allergen Reactions   Celebrex [Celecoxib] Swelling   Mobic [Meloxicam] Swelling   Adhesive [Tape] Itching and Rash   Silicone Itching and Rash    Current Meds  Medication Sig   albuterol (VENTOLIN HFA) 108 (90 Base) MCG/ACT inhaler USE 2 INHALATIONS BY MOUTH EVERY 6 HOURS AS NEEDED FOR WHEEZING  OR SHORTNESS OF BREATH   Ascorbic Acid (VITAMIN C PO) Take by mouth daily.   aspirin 81 MG tablet Take 81 mg by mouth daily.   atorvastatin (LIPITOR) 20 MG tablet TAKE 1 TABLET BY MOUTH ONCE  DAILY   Calcium Carb-Cholecalciferol (CALCIUM 600 + D PO) Take 1 tablet by mouth daily.   Cranberry-Vitamin C 140-100 MG CAPS Take 2 capsules by mouth daily.    fexofenadine (ALLEGRA) 180 MG tablet Take 180 mg by mouth daily.   Fluticasone-Umeclidin-Vilant (TRELEGY ELLIPTA) 100-62.5-25 MCG/ACT AEPB Inhale 1 puff into the lungs daily.   furosemide (LASIX) 20 MG tablet Take 1 tablet (20 mg total) by mouth daily.   gabapentin (NEURONTIN) 300 MG capsule Take 300 mg by mouth daily.   Glucosamine HCl 1500 MG TABS Take 1 tablet by mouth daily.   guaifenesin (HUMIBID E) 400 MG TABS tablet Take 400 mg by mouth daily.   levothyroxine (SYNTHROID, LEVOTHROID) 150 MCG tablet Take 150 mcg by mouth daily before breakfast. Take 1 tablet M, T, Thurs, Fri and Sat. Take 1/2 tablet Wednesday and Skip Sunday.   losartan (COZAAR) 100 MG tablet TAKE 1 TABLET BY MOUTH DAILY   Multiple Vitamins-Minerals (MULTIVITAMIN & MINERAL PO) Take 1 tablet by mouth daily.   Multiple Vitamins-Minerals (PRESERVISION AREDS 2) CAPS in the morning and at bedtime.   omeprazole (PRILOSEC) 40 MG capsule Take 40 mg by mouth daily.   OXYGEN Inhale 2 L into the lungs at  bedtime.   traMADol (ULTRAM) 50 MG tablet Take 50 mg by mouth every 6 (six) hours as needed for moderate pain.    TRELEGY ELLIPTA 100-62.5-25 MCG/ACT AEPB USE 1 INHALATION BY MOUTH ONCE  DAILY AT THE SAME TIME EACH DAY   VITAMIN E PO Take by mouth daily.    Immunization History  Administered Date(s) Administered   Fluad Quad(high Dose 65+) 01/30/2019   Influenza-Unspecified 02/16/2020   Zoster Recombinant(Shingrix) 06/17/2018, 11/20/2018      Objective:     BP 120/80 (BP Location: Left Arm, Cuff Size: Normal)   Pulse 85   Temp 98 F (36.7 C)   Ht 5' (1.524 m)   Wt 166 lb 12.8 oz (75.7 kg)   SpO2 98%   BMI 32.58 kg/m   SpO2: 98 % O2 Device: None (Room air)  GENERAL: Frail-appearing woman, no acute distress, fully ambulatory.  Uses cane for ambulating assistance. HEAD: Normocephalic, atraumatic.  EYES: Pupils equal,  round, reactive to light.  No scleral icterus.  MOUTH: Wears dentures uppers and lowers NECK: Supple. No thyromegaly. Trachea midline.  Prominent A waves.  No adenopathy. PULMONARY: Increased AP diameter.  Good air entry bilaterally.  No adventitious sounds. CARDIOVASCULAR: S1 and S2. Regular rate and rhythm.  No rubs, murmurs or gallops heard. ABDOMEN: Benign. MUSCULOSKELETAL: Significant kyphosis.  OA changes both hands, no clubbing, trace to 1+ edema, LE's.  NEUROLOGIC: No focal deficit, speech is fluent, gait without disturbance though does use cane for steadying. SKIN: Intact,warm,dry.  Mild stasis changes lower extremities. PSYCH: Mood and behavior are normal.   Assessment & Plan:     ICD-10-CM   1. COPD with asthma  J44.89 DG Chest 2 View   Continue Trelegy Ellipta 100, 1 puff daily Continue as needed albuterol    2. SOB (shortness of breath)  R06.02 DG Chest 2 View   Chest x-ray today Continue medications as above    3. Nocturnal hypoxemia due to emphysema [facility])  J43.9    G47.36    Patient compliant with nocturnal oxygen Continue 2 L/min  nocturnally Patient compliant with therapy and notes benefit    4. Chronic diastolic heart failure (HCC)  Z61.09    This issue adds complexity to her management Follows with cardiology    5. Pulmonary hypertension due to COPD [facility])  I27.23    J44.9    This issue adds complexity to her management Multifactorial: Diastolic dysfunction/COPD Continue nocturnal O2 as above Continue follow-up with cardiology    6. Former smoker  Z87.891    Quit about 15 years ago Eligible for lung cancer screening Patient declines lung cancer screening      Orders Placed This Encounter  Procedures   DG Chest 2 View    Standing Status:   Future    Standing Expiration Date:   11/20/2023    Order Specific Question:   Reason for Exam (SYMPTOM  OR DIAGNOSIS REQUIRED)    Answer:   COPD/SOB    Order Specific Question:   Preferred imaging location?    Answer:   [facility]    Meds ordered this encounter  Medications   Fluticasone-Umeclidin-Vilant (TRELEGY ELLIPTA) 100-62.5-25 MCG/ACT AEPB    Sig: Inhale 1 puff into the lungs daily.    Order Specific Question:   Lot Number?    Answer:   [id number]    Order Specific Question:   Expiration Date?    Answer:   03/01/2024    Order Specific Question:   Quantity    Answer:   2    Will see the patient in follow-up in 4 months time she is to contact us prior to that time should any new difficulties arise.   Gailen Shelter, MD Advanced Bronchoscopy PCCM [facility]    *This note was dictated using voice recognition software/Dragon.  Despite best efforts to proofread, errors can occur which can change the meaning. Any transcriptional errors that result from this process are unintentional and may not be fully corrected at the time of dictation.

## 2022-11-20 NOTE — Telephone Encounter (Signed)
Patient was seen in the office today and given the [employer] assistance program paperwork to complete and return to the office. She will fill out the form and return it to our office.

## 2022-11-20 NOTE — Patient Instructions (Addendum)
Your lungs sounded good today.  We have provided you with some samples of Trelegy.  We also provided you with the forms for assistance to get the medication.  We are going to get a chest x-ray today.  We will see him in follow-up in 4 months time call sooner should any new problems arise.

## 2022-11-22 NOTE — Telephone Encounter (Signed)
Lm for patient for update.  

## 2022-11-24 NOTE — Telephone Encounter (Signed)
I spoke with the patient. She is waiting for a EOB from her pharmacy. She will bring the form back once she receives it.

## 2022-11-29 NOTE — Telephone Encounter (Signed)
Spoke to patient. She stated that she is still waiting for EOB. She says she should receive it around the first of the month.

## 2022-12-06 NOTE — Telephone Encounter (Signed)
Spoke to patient. She states that she received EOB. She plans to bring forms by our office this week.

## 2022-12-07 ENCOUNTER — Ambulatory Visit: Payer: Medicare Other

## 2022-12-19 NOTE — Telephone Encounter (Signed)
Lm x1 for patient for update.

## 2022-12-21 ENCOUNTER — Other Ambulatory Visit: Payer: Self-pay | Admitting: Cardiovascular Disease

## 2022-12-21 NOTE — Telephone Encounter (Signed)
Spoke to patient. She stated that she would bring EOB by next week. Will close encounter.

## 2022-12-29 ENCOUNTER — Other Ambulatory Visit: Payer: Self-pay | Admitting: Pulmonary Disease

## 2023-01-02 MED ORDER — TRELEGY ELLIPTA 100-62.5-25 MCG/ACT IN AEPB: 1.0000 | INHALATION_SPRAY | Freq: Every day | RESPIRATORY_TRACT | 3 refills | Status: DC

## 2023-01-02 NOTE — Telephone Encounter (Signed)
The patient dropped off her form today. I have printed a prescription for Trelegy and faxed all the paperwork to [employer].  Nothing further needed.

## 2023-01-02 NOTE — Addendum Note (Signed)
Addended by: Bonney Leitz on: 01/02/2023 01:31 PM   Modules accepted: Orders

## 2023-01-04 ENCOUNTER — Ambulatory Visit
Admission: RE | Admit: 2023-01-04 | Discharge: 2023-01-04 | Disposition: A | Discharge status: Home / Self Care | Payer: Medicare Other | Source: Ambulatory Visit | Attending: Internal Medicine | Admitting: Internal Medicine

## 2023-01-04 DIAGNOSIS — Z1231 Encounter for screening mammogram for malignant neoplasm of breast: Secondary | ICD-10-CM | POA: Insufficient documentation

## 2023-02-01 NOTE — Telephone Encounter (Signed)
Spoke to patient for update. She stated that she received additional forms from [employer] to be completed. She will complete them and bring them by our office.

## 2023-02-22 NOTE — Telephone Encounter (Signed)
The patient dropped off the form today. I have faxed it to [employer].   Nothing further needed.

## 2023-03-09 ENCOUNTER — Other Ambulatory Visit: Payer: Self-pay | Admitting: Cardiovascular Disease

## 2023-03-20 ENCOUNTER — Telehealth: Payer: Self-pay

## 2023-03-20 ENCOUNTER — Encounter: Payer: Self-pay | Admitting: Pulmonary Disease

## 2023-03-20 ENCOUNTER — Ambulatory Visit: Payer: Medicare Other | Admitting: Pulmonary Disease

## 2023-03-20 VITALS — BP 118/80 | HR 82 | Temp 97.1°F | Ht 60.0 in | Wt 167.6 lb

## 2023-03-20 DIAGNOSIS — J4489 Other specified chronic obstructive pulmonary disease: Secondary | ICD-10-CM

## 2023-03-20 DIAGNOSIS — R0602 Shortness of breath: Secondary | ICD-10-CM

## 2023-03-20 DIAGNOSIS — J441 Chronic obstructive pulmonary disease with (acute) exacerbation: Secondary | ICD-10-CM

## 2023-03-20 DIAGNOSIS — I2723 Pulmonary hypertension due to lung diseases and hypoxia: Secondary | ICD-10-CM | POA: Diagnosis not present

## 2023-03-20 DIAGNOSIS — J3089 Other allergic rhinitis: Secondary | ICD-10-CM

## 2023-03-20 DIAGNOSIS — J439 Emphysema, unspecified: Secondary | ICD-10-CM

## 2023-03-20 MED ORDER — TRELEGY ELLIPTA 100-62.5-25 MCG/ACT IN AEPB: 1.0000 | INHALATION_SPRAY | Freq: Every day | RESPIRATORY_TRACT | 3 refills | Status: DC

## 2023-03-20 NOTE — Telephone Encounter (Signed)
Received [place] form. Completed and faxed with clinicals and insurance card  Phone#: [phone] Fax#: [phone]  Chesley Mires, PharmD, MPH, BCPS, CPP Clinical Pharmacist (Rheumatology and Pulmonology)

## 2023-03-20 NOTE — Telephone Encounter (Signed)
Patient was seen in the office today. She brought in Trelegy/[employer] assistance program forms to be faxed in. A prescription has been printed and faxed in with the forms.  Nothing further needed.

## 2023-03-20 NOTE — Telephone Encounter (Signed)
Patient was seen in the office today. Dr. Jayme Cloud has ordered [facility] for the patient.  She has signed the form and it has been faxed to the pharmacy team.

## 2023-03-20 NOTE — Progress Notes (Signed)
Subjective:    Patient ID: Carolyn Steele, female    DOB: [birth year], 77 y.o.   MRN: [id number]  Patient Care Team: Jaclyn Shaggy, MD as PCP - General (Internal Medicine) Iran Ouch, MD as PCP - Cardiology (Cardiology)  Chief Complaint  Patient presents with   Follow-up    DOE. Wheezing with exertion. Dry cough starting a couple days ago.    BACKGROUND/INTERVAL: Patient is a 77 year old former smoker with a 40-pack-year history of smoking, she presents for follow-up on the issue of COPD with asthma overlap.  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  Patient last seen here on 20 November 2022.Marland Kitchen  She also declined lung cancer screening.  HPI Discussed the use of AI scribe software for clinical note transcription with the patient, who gave verbal consent to proceed.  History of Present Illness   The patient, with a known history of COPD and on nocturnal oxygen therapy, presents with increased shortness of breath, particularly when walking without the aid of a walker. The patient reports that the further she walks, the more stooped she becomes, which exacerbates her breathing difficulties. Despite these challenges, the patient has been managing her symptoms with the use of a walker and a nebulizer.  The patient also reports a recent onset of dry cough, which she attributes to sinus congestion, possibly due to allergies. She has been self-managing these symptoms with over-the-counter Tylenol Sinus and prescription Allegra.  The patient has not noticed any significant leg swelling. She is compliant with her current medication regimen, which includes as needed albuterol. She reports that the Trelegy has been beneficial in managing her symptoms.  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 This has limited her ability to participate in regular exercise or pulmonary  rehabilitation programs. She expresses a willingness to consider a home-based program if provided with appropriate guidance and resources.      DATA 01/10/2019 CT angio chest: No PE, bilateral pleural effusions, small, right lung compressive atelectasis, cardiomegaly, centrilobular emphysema.  There is also moderate hiatal hernia 08/28/2019 overnight oximetry: Desaturations to as low as 83%, patient instructed to use 2 L/min nocturnally 09/01/2019 PFTs: FEV1 0.75 L or 40% predicted FEV1/FVC 46%, FVC 1.63 L or 66% predicted. Post bronchodilator patient had 34% change in FEV1 and 27% change in FVC indicating asthmatic component. Diffusion capacity moderately to severely reduced.  Consistent with severe COPD with asthmatic component 09/01/2019 2D echo: LVEF is 55 to 60%, mild LVH, mild elevation of the pulmonary artery systolic pressure not determined, tricuspid regurgitation mild to moderate 02/01/2021 echocardiogram: LVEF 60 to 65%, Grade II DD, mild elevation in pulmonary systolic pressure, RV systolic pressure 44.6 mmHg. 08/08/2022 PFTs: FEV1 0.77 L or 44% predicted, FVC 1.63 L or 69% predicted, FEV1/FVC 47%.  There is a significant bronchodilator response with FEV1 increasing 14% postbronchodilator.  Lung volumes show air trapping and hyperinflation.  Diffusion capacity is severely reduced. 08/15/2022 ambulatory oximetry: Patient able to ambulate 750 feet, moderate dyspnea, O2 sat nadir 91%.  Review of Systems A 10 point review of systems was performed and it is as noted above otherwise negative.   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) 08/30/2020   COPD with asthma (HCC) 01/12/2020   Acute on chronic diastolic heart failure (  HCC)    CHF (congestive heart failure) (HCC) 01/10/2019    Social History   Tobacco Use   Smoking status: Former    Current packs/day: 0.00     Average packs/day: 1 pack/day for 40.0 years (40.0 ttl pk-yrs)    Types: Cigarettes    Start date: 49    Quit date: 2010    Years since quitting: 14.8   Smokeless tobacco: Never   Tobacco comments:    quit smoking approximately 10-12 years ago (05/28/2015)  Substance Use Topics   Alcohol use: No    Allergies  Allergen Reactions   Celebrex [Celecoxib] Swelling   Mobic [Meloxicam] Swelling   Adhesive [Tape] Itching and Rash   Silicone Itching and Rash    Current Meds  Medication Sig   albuterol (VENTOLIN HFA) 108 (90 Base) MCG/ACT inhaler USE 2 INHALATIONS BY MOUTH EVERY 6 HOURS AS NEEDED FOR WHEEZING  OR SHORTNESS OF BREATH   Ascorbic Acid (VITAMIN C PO) Take by mouth daily.   aspirin 81 MG tablet Take 81 mg by mouth daily.   atorvastatin (LIPITOR) 20 MG tablet TAKE 1 TABLET BY MOUTH ONCE  DAILY   Calcium Carb-Cholecalciferol (CALCIUM 600 + D PO) Take 1 tablet by mouth daily.   Cranberry-Vitamin C 140-100 MG CAPS Take 2 capsules by mouth daily.    fexofenadine (ALLEGRA) 180 MG tablet Take 180 mg by mouth daily.   furosemide (LASIX) 20 MG tablet Take 1 tablet (20 mg total) by mouth daily.   gabapentin (NEURONTIN) 300 MG capsule Take 300 mg by mouth daily.   Glucosamine HCl 1500 MG TABS Take 1 tablet by mouth daily.   guaifenesin (HUMIBID E) 400 MG TABS tablet Take 400 mg by mouth daily.   levothyroxine (SYNTHROID, LEVOTHROID) 150 MCG tablet Take 150 mcg by mouth daily before breakfast. Take 1 tablet M, T, Thurs, Fri and Sat. Take 1/2 tablet Wednesday and Skip Sunday.   losartan (COZAAR) 100 MG tablet TAKE 1 TABLET BY MOUTH DAILY   Multiple Vitamins-Minerals (MULTIVITAMIN & MINERAL PO) Take 1 tablet by mouth daily.   Multiple Vitamins-Minerals (PRESERVISION AREDS 2) CAPS in the morning and at bedtime.   Omega-3 Fatty Acids (FISH OIL) 1000 MG CAPS Take 500 mg by mouth daily.   omeprazole (PRILOSEC) 40 MG capsule Take 40 mg by mouth daily.   OXYGEN Inhale 2 L into the lungs at  bedtime.   Teriparatide, Recombinant, 620 MCG/2.48ML SOPN Inject into the skin daily.   traMADol (ULTRAM) 50 MG tablet Take 50 mg by mouth every 6 (six) hours as needed for moderate pain.    VITAMIN E PO Take by mouth daily.   [DISCONTINUED] Fluticasone-Umeclidin-Vilant (TRELEGY ELLIPTA) 100-62.5-25 MCG/ACT AEPB Inhale 1 puff into the lungs daily.    Immunization History  Administered Date(s) Administered   Fluad Quad(high Dose 65+) 01/30/2019   Influenza-Unspecified 02/16/2020   Zoster Recombinant(Shingrix) 06/17/2018, 11/20/2018      Objective:     BP 118/80 (BP Location: Right Arm, Cuff Size: Normal)   Pulse 82   Temp (!) 97.1 F (36.2 C)   Ht 5' (1.524 m)   Wt 167 lb 9.6 oz (76 kg)   SpO2 96%   BMI 32.73 kg/m   SpO2: 96 % O2 Device: None (Room air)  GENERAL: Frail-appearing woman, no acute distress, fully ambulatory.  Uses walker for ambulating assistance. HEAD: Normocephalic, atraumatic.  EYES: Pupils equal, round, reactive to light.  No scleral icterus.  MOUTH: Wears dentures uppers and lowers NECK:  Supple. No thyromegaly. Trachea midline.  Prominent A waves.  No adenopathy. PULMONARY: Increased AP diameter. Distant breath sounds.  No adventitious sounds. CARDIOVASCULAR: S1 and S2. Regular rate and rhythm.  No rubs, murmurs or gallops heard. ABDOMEN: Benign. MUSCULOSKELETAL: Significant kyphosis.  OA changes both hands, no clubbing, trace to 1+ edema, LE's.  NEUROLOGIC: No focal deficit, speech is fluent, gait without disturbance though does use cane for steadying. SKIN: Intact,warm,dry.  Mild stasis changes lower extremities. PSYCH: Mood and behavior are normal.  Ambulatory oxymetry was performed today:  At rest on room air oxygen saturation was percent, the patient ambulated at a slow pace, assisted by walker, completed 3 laps, O2 nadir 93%, no shortness of breath.  Resting heart rate was 86 bpm at maximum for this exercise 96 bpm.    Assessment & Plan:      ICD-10-CM   1. COPD with asthma (HCC)  J44.89    Stage III COPD Moderate persistent asthma    2. SOB (shortness of breath)  R06.02 ECHOCARDIOGRAM COMPLETE    3. Nocturnal hypoxemia due to emphysema (HCC)  J43.9    G47.36     4. 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 Perennial allergic rhinitis  J30.89       Orders Placed This Encounter  Procedures   ECHOCARDIOGRAM COMPLETE    Standing Status:   Future    Standing Expiration Date:   03/19/2024    Order Specific Question:   Where should this test be performed    Answer:   [facility]    Order Specific Question:   Please indicate who you request to read the nuc med / echo results.    Answer:   [facility]    Order Specific Question:   Perflutren DEFINITY (image enhancing agent) should be administered unless hypersensitivity or allergy exist    Answer:   Administer Perflutren    Order Specific Question:   Reason for exam-Echo    Answer:   Pulmonary hypertension I27.2    Order Specific Question:   Reason for exam-Echo    Answer:   Dyspnea  R06.00   Meds ordered this encounter  Medications   Fluticasone-Umeclidin-Vilant (TRELEGY ELLIPTA) 100-62.5-25 MCG/ACT AEPB    Sig: Inhale 1 puff into the lungs daily.    Dispense:  3 each    Refill:  3    Order Specific Question:   Lot Number?    Answer:   [id number]    Order Specific Question:   Expiration Date?    Answer:   03/01/2024    Order Specific Question:   Quantity    Answer:   2   Discussion:    Chronic Obstructive Pulmonary Disease (COPD) Increased dyspnea with ambulation, especially without a walker. Recent non-infectious cough likely due to sinus congestion or allergies. Physical exam shows clear but distant lung sounds. Oxygen saturation stable during ambulation. Improved sleep with nocturnal oxygen use. Discussed Ohtuvayre via nebulizer for reducing breathlessness and cough, and potential cost assistance. Considered  pulmonary rehab ([facility]) but deferred due to caregiving responsibilities. - Start Ohtuvayre  via nebulizer twice daily - Continue Trelegy - Check oxygen levels during ambulation - Order echocardiogram to assess pulmonary artery pressure - Consider pulmonary rehab program ([facility]) for home exercise  Pulmonary Hypertension Pulmonary hypertension with last echocardiogram in 2022. Emphasized continued nocturnal oxygen use to reduce pulmonary artery pressure. Ordered echocardiogram to monitor condition. - Order echocardiogram -  Continue nighttime oxygen use  Allergic Rhinitis Sinus congestion and recent cough likely due to allergies. Currently managed with Allegra. - Continue Allegra  General Health Maintenance Due for flu and pneumonia vaccinations. Plans to discuss with primary care physician, Dr. Clarene Essex, during upcoming visit. - Discuss flu and pneumonia vaccinations with primary care physician  Follow-up - Follow up in three months.      Gailen Shelter, MD Advanced Bronchoscopy P[facility]    *This note was generated using voice recognition software/Dragon and/or AI transcription program.  Despite best efforts to proofread, errors can occur which can change the meaning. Any transcriptional errors that result from this process are unintentional and may not be fully corrected at the time of dictation.

## 2023-03-20 NOTE — Patient Instructions (Signed)
VISIT SUMMARY:  During today's visit, we discussed your increased shortness of breath, particularly when walking without a walker, and your recent onset of cough, which you believe is due to sinus congestion from allergies. We reviewed your current medication regimen and discussed potential new treatments to help manage your symptoms. We also talked about your caregiving responsibilities and how they impact your ability to participate in regular exercise or pulmonary rehabilitation programs.  YOUR PLAN:  -CHRONIC OBSTRUCTIVE PULMONARY DISEASE (COPD): COPD is a chronic lung condition that makes it hard to breathe. We discussed your increased shortness of breath and recent cough. You will start using Ohtuvayre via nebulizer twice daily to help reduce breathlessness and cough. Continue using Trelegy and check your oxygen levels during ambulation. We also ordered an echocardiogram to assess your pulmonary artery pressure and considered a home-based pulmonary rehab program for you.  -PULMONARY HYPERTENSION: Pulmonary hypertension is high blood pressure in the arteries of your lungs. We emphasized the importance of continuing your nighttime oxygen use to help reduce pulmonary artery pressure. An echocardiogram has been ordered to monitor your condition.  -ALLERGIC RHINITIS: Allergic rhinitis is inflammation of the nasal passages caused by allergies. Your sinus congestion and recent cough are likely due to allergies. Continue taking Allegra to manage these symptoms.  -GENERAL HEALTH MAINTENANCE: You are due for flu and pneumonia vaccinations. Please discuss these vaccinations with your primary care physician, Dr. Dewaine Oats, during your upcoming visit.  INSTRUCTIONS:  Please follow up in three months. Continue using your nebulizer and Trelegy as prescribed. Start using Ohtuvayre via nebulizer twice daily. Check your oxygen levels during ambulation. An echocardiogram has been ordered to assess your pulmonary  artery pressure. Discuss flu and pneumonia vaccinations with your primary care physician, Dr. Dewaine Oats, during your upcoming visit.

## 2023-03-23 NOTE — Telephone Encounter (Signed)
Summary of Benefits received from [facility]. Per investigation pt is mandated to fill through [employer] and supposedly no PA is required. Will wait to see if any further correspondence is needed. SoB has been sent to scan center for retention.

## 2023-05-03 ENCOUNTER — Ambulatory Visit
Admission: RE | Admit: 2023-05-03 | Discharge: 2023-05-03 | Disposition: A | Discharge status: Home / Self Care | Payer: Medicare Other | Source: Ambulatory Visit | Attending: Pulmonary Disease | Admitting: Pulmonary Disease

## 2023-05-03 DIAGNOSIS — I10 Essential (primary) hypertension: Secondary | ICD-10-CM | POA: Diagnosis not present

## 2023-05-03 DIAGNOSIS — I2723 Pulmonary hypertension due to lung diseases and hypoxia: Secondary | ICD-10-CM

## 2023-05-03 DIAGNOSIS — I272 Pulmonary hypertension, unspecified: Secondary | ICD-10-CM | POA: Insufficient documentation

## 2023-05-03 DIAGNOSIS — J449 Chronic obstructive pulmonary disease, unspecified: Secondary | ICD-10-CM | POA: Diagnosis not present

## 2023-05-03 DIAGNOSIS — R0602 Shortness of breath: Secondary | ICD-10-CM | POA: Diagnosis not present

## 2023-05-03 LAB — ECHOCARDIOGRAM COMPLETE
AR max vel: 4 cm2
AV Area VTI: 3.96 cm2
AV Area mean vel: 3.47 cm2
AV Mean grad: 3 mm[Hg]
AV Peak grad: 6.3 mm[Hg]
Ao pk vel: 1.26 m/s
Area-P 1/2: 3.33 cm2
MV VTI: 2.71 cm2
S' Lateral: 2.7 cm

## 2023-05-03 NOTE — Progress Notes (Signed)
*  PRELIMINARY RESULTS* Echocardiogram 2D Echocardiogram has been performed.  Carolyn Steele 05/03/2023, 9:10 AM

## 2023-05-08 ENCOUNTER — Other Ambulatory Visit: Payer: Self-pay | Admitting: Cardiovascular Disease

## 2023-05-09 NOTE — Telephone Encounter (Signed)
 LVM to schedule f/u appt

## 2023-05-09 NOTE — Telephone Encounter (Signed)
 Please contact pt for future appointment. Pt due for 6 month f/u.

## 2023-05-29 ENCOUNTER — Other Ambulatory Visit: Payer: Self-pay | Admitting: *Deleted

## 2023-05-29 ENCOUNTER — Encounter: Payer: Self-pay | Admitting: Physician Assistant

## 2023-05-29 ENCOUNTER — Ambulatory Visit: Payer: Medicare Other | Attending: Physician Assistant | Admitting: Physician Assistant

## 2023-05-29 VITALS — BP 131/72 | HR 75 | Ht 60.0 in | Wt 161.0 lb

## 2023-05-29 DIAGNOSIS — I2723 Pulmonary hypertension due to lung diseases and hypoxia: Secondary | ICD-10-CM

## 2023-05-29 DIAGNOSIS — I5032 Chronic diastolic (congestive) heart failure: Secondary | ICD-10-CM | POA: Diagnosis not present

## 2023-05-29 DIAGNOSIS — J9611 Chronic respiratory failure with hypoxia: Secondary | ICD-10-CM

## 2023-05-29 DIAGNOSIS — E785 Hyperlipidemia, unspecified: Secondary | ICD-10-CM | POA: Diagnosis not present

## 2023-05-29 DIAGNOSIS — J449 Chronic obstructive pulmonary disease, unspecified: Secondary | ICD-10-CM

## 2023-05-29 DIAGNOSIS — I739 Peripheral vascular disease, unspecified: Secondary | ICD-10-CM

## 2023-05-29 DIAGNOSIS — I10 Essential (primary) hypertension: Secondary | ICD-10-CM | POA: Diagnosis not present

## 2023-05-29 MED ORDER — ATORVASTATIN CALCIUM 40 MG PO TABS: 40.0000 mg | ORAL_TABLET | Freq: Every day | ORAL | 3 refills | Status: AC

## 2023-05-29 MED ORDER — ATORVASTATIN CALCIUM 40 MG PO TABS: 40.0000 mg | ORAL_TABLET | Freq: Every day | ORAL | 3 refills | Status: DC

## 2023-05-29 NOTE — Patient Instructions (Signed)
Medication Instructions:  Your physician has recommended you make the following change in your medication:   INCREASE Lipitor (atorvastatin) to 40 mg once daily   *If you need a refill on your cardiac medications before your next appointment, please call your pharmacy*   Lab Work: None  If you have labs (blood work) drawn today and your tests are completely normal, you will receive your results only by: MyChart Message (if you have MyChart) OR A paper copy in the mail If you have any lab test that is abnormal or we need to change your treatment, we will call you to review the results.   Testing/Procedures: None   Follow-Up: At [facility], you and your health needs are our priority.  As part of our continuing mission to provide you with exceptional heart care, we have created designated Provider Care Teams.  These Care Teams include your primary Cardiologist (physician) and Advanced Practice Providers (APPs -  Physician Assistants and Nurse Practitioners) who all work together to provide you with the care you need, when you need it.  We recommend signing up for the patient portal called "MyChart".  Sign up information is provided on this After Visit Summary.  MyChart is used to connect with patients for Virtual Visits (Telemedicine).  Patients are able to view lab/test results, encounter notes, upcoming appointments, etc.  Non-urgent messages can be sent to your provider as well.   To learn more about what you can do with MyChart, go to [email or web address].    Your next appointment:   6 month(s)  Provider:   Lorine Bears, MD or Eula Listen, PA-C

## 2023-05-29 NOTE — Progress Notes (Signed)
Cardiology Office Note    Date:  05/29/2023   ID:  Carolyn Steele, DOB 1946-01-20, MRN [id number]  PCP:  Jaclyn Shaggy, MD  Cardiologist:  Lorine Bears, MD  Electrophysiologist:  None   Chief Complaint: Follow-up  History of Present Illness:   Carolyn Steele is a 78 y.o. female with history of HFpEF, chronic hypoxic respiratory failure on nocturnal home oxygen, COPD, PAD medically managed, HTN, HLD, hypothyroidism, chronic back pain status post back stimulator, and GERD who presents for follow-up of HFpEF.   She was admitted to the hospital in 12/2018 with heart failure in the setting of uncontrolled hypertension.  Echo at that time showed normal LV systolic function with evidence of diastolic dysfunction.  Troponin was mildly elevated and felt to be related to supply demand ischemia.  Stress testing showed no evidence of ischemia or infarct.  Echo in 08/2019 showed normal LV systolic function, normal diastolic function, mild to moderate tricuspid regurgitation with mild pulmonary hypertension with an estimated PASP of 45 mmHg.  Noninvasive lower extremity imaging in 08/2019 showed an ABI of 0.75 on the right and 0.95 on the left.  Duplex showed significant iliac disease, worse on the right.  She has been medically managed in the context of no claudication.  She was admitted to [facility] in 12/2019 with a large pneumothorax after she fractured her ribs from a fall and required chest tube placement.  Echo in 2022 showed normal LV systolic function, grade 2 diastolic dysfunction, mild to moderate pulmonary hypertension, mild mitral regurgitation, and mild to moderate tricuspid regurgitation.  She was last seen in the office in 09/2022 and was doing well from a cardiac perspective.  She was under increased stress.  Echo in 05/2023 showed an EF of 60 to 65%, no regional wall motion abnormalities, normal LV diastolic function parameters, normal RV systolic function and ventricular cavity size, mildly  elevated RVSP estimated at 43.7 mmHg, trivial mitral regurgitation, aortic valve sclerosis without evidence of stenosis, and an estimated right atrial pressure of 3 mmHg.  She comes in doing well from a cardiac perspective and is without symptoms of angina or cardiac decompensation.  No dizziness, presyncope, or syncope.  No lower extremity swelling or progressive orthopnea.  No symptoms of claudication, nonhealing wounds, or critical limb ischemia.  She continues to ambulate with a walker.  She reports one fall since her last visit, this was mechanical.  She remains adherent to nocturnal oxygen at 2 L.  Weight is down 5 pounds when compared to her last clinic visit.  No bleeding concerns.  Overall feels well and does not have any acute cardiac issues at this time.   Labs independently reviewed: 04/2023 - TSH normal, BUN 17, serum creatinine 0.57, potassium 4.7, albumin 4.2, AST/ALT normal, TC 213, TG 71, HDL 96, LDL 105 12/2019 - Hgb 11.5, PLT 355   Past Medical History:  Diagnosis Date   (HFpEF) heart failure with preserved ejection fraction (HCC)    a. 12/2018 Echo: EF 60-65%, pseudonormalization. Nl RV fxn. RVSP 51 mmHg. Mild MR/TR; b. 01/2021 Echo: EF 60-65%, no rwma, GrII DD. Nl RV size/fxn. RVSP 44.36mmHg. Mild MR. Mild-mod TR. Mild AoV sclerosis w/o stenosis.   Acid reflux    Arthritis    Asthma    COPD (chronic obstructive pulmonary disease) (HCC)    a. Home O2 started 12/2018.   Demand ischemia (HCC)    a. 12/2018 mild hsTrop elevation in setting of diast chf/htn urgency-->MV: no ischemia/infarct.  EF 55-65%.   Depression    Hypercholesteremia    Hypertension    Hypothyroidism    Wears glasses     Past Surgical History:  Procedure Laterality Date   CHOLECYSTECTOMY     COLONOSCOPY     COLONOSCOPY WITH PROPOFOL N/A 09/21/2021   Procedure: COLONOSCOPY WITH PROPOFOL;  Surgeon: Earline Mayotte, MD;  Location: [facility] ENDOSCOPY;  Service: Endoscopy;  Laterality: N/A;   FOOT SURGERY  Right    HAND SURGERY Bilateral    SHOULDER SURGERY Left    SPINAL CORD STIMULATOR INSERTION N/A 06/04/2015   Procedure: LUMBAR SPINAL CORD STIMULATOR INSERTION;  Surgeon: Odette Fraction, MD;  Location: [facility];  Service: Neurosurgery;  Laterality: N/A;    Current Medications: Current Meds  Medication Sig   albuterol (VENTOLIN HFA) 108 (90 Base) MCG/ACT inhaler USE 2 INHALATIONS BY MOUTH EVERY 6 HOURS AS NEEDED FOR WHEEZING  OR SHORTNESS OF BREATH   Ascorbic Acid (VITAMIN C PO) Take by mouth daily.   aspirin 81 MG tablet Take 81 mg by mouth daily.   atorvastatin (LIPITOR) 40 MG tablet Take 1 tablet (40 mg total) by mouth daily.   Calcium Carb-Cholecalciferol (CALCIUM 600 + D PO) Take 1 tablet by mouth daily.   Cranberry-Vitamin C 140-100 MG CAPS Take 2 capsules by mouth daily.    fexofenadine (ALLEGRA) 180 MG tablet Take 180 mg by mouth daily.   Fluticasone-Umeclidin-Vilant (TRELEGY ELLIPTA) 100-62.5-25 MCG/ACT AEPB Inhale 1 puff into the lungs daily.   furosemide (LASIX) 20 MG tablet Take 1 tablet (20 mg total) by mouth daily.   gabapentin (NEURONTIN) 300 MG capsule Take 300 mg by mouth daily.   Glucosamine HCl 1500 MG TABS Take 1 tablet by mouth daily.   guaifenesin (HUMIBID E) 400 MG TABS tablet Take 400 mg by mouth daily.   levothyroxine (SYNTHROID, LEVOTHROID) 150 MCG tablet Take 150 mcg by mouth daily before breakfast. Take 1 tablet M, T, Thurs, Fri and Sat. Take 1/2 tablet Wednesday and Skip Sunday.   losartan (COZAAR) 100 MG tablet TAKE 1 TABLET BY MOUTH DAILY   Multiple Vitamins-Minerals (MULTIVITAMIN & MINERAL PO) Take 1 tablet by mouth daily.   Multiple Vitamins-Minerals (PRESERVISION AREDS 2) CAPS in the morning and at bedtime.   omeprazole (PRILOSEC) 40 MG capsule Take 40 mg by mouth daily.   OXYGEN Inhale 2 L into the lungs at bedtime.   Teriparatide, Recombinant, 620 MCG/2.48ML SOPN Inject into the skin daily.   traMADol (ULTRAM) 50 MG tablet Take 50 mg by mouth every 6  (six) hours as needed for moderate pain.    VITAMIN E PO Take by mouth daily.   [DISCONTINUED] atorvastatin (LIPITOR) 20 MG tablet TAKE 1 TABLET BY MOUTH ONCE  DAILY    Allergies:   Celebrex [celecoxib], Mobic [meloxicam], Adhesive [tape], and Silicone   Social History   Socioeconomic History   Marital status: Widowed    Spouse name: Not on file   Number of children: Not on file   Years of education: Not on file   Highest education level: Not on file  Occupational History   Not on file  Tobacco Use   Smoking status: Former    Current packs/day: 0.00    Average packs/day: 1 pack/day for 40.0 years (40.0 ttl pk-yrs)    Types: Cigarettes    Start date: 45    Quit date: 2010    Years since quitting: 15.0   Smokeless tobacco: Never   Tobacco comments:  quit smoking approximately 10-12 years ago (05/28/2015)  Vaping Use   Vaping status: Never Used  Substance and Sexual Activity   Alcohol use: No   Drug use: No   Sexual activity: Not on file  Other Topics Concern   Not on file  Social History Narrative   Not on file   Social Drivers of Health   Financial Resource Strain: Low Risk  (01/12/2020)   Received from [facility], [facility]   Overall Financial Resource Strain (CARDIA)    Difficulty of Paying Living Expenses: Not hard at all  Food Insecurity: No Food Insecurity (01/12/2020)   Received from [facility], [facility]   Hunger Vital Sign    Worried About Running Out of Food in the Last Year: Never true    Ran Out of Food in the Last Year: Never true  Transportation Needs: No Transportation Needs (01/12/2020)   Received from [facility], [facility]   [facility] [address] - Transportation    Lack of Transportation (Medical): No    Lack of Transportation (Non-Medical): No  Physical Activity: Insufficiently Active (03/03/2019)   Exercise Vital Sign    Days of Exercise per Week: 7 days    Minutes of Exercise per Session: 20 min  Stress: No Stress Concern  Present (03/03/2019)   [employer] of Occupational Health - Occupational Stress Questionnaire    Feeling of Stress : Not at all  Social Connections: Moderately Isolated (03/03/2019)   Social Connection and Isolation Panel [NHANES]    Frequency of Communication with Friends and Family: More than three times a week    Frequency of Social Gatherings with Friends and Family: More than three times a week    Attends Religious Services: 1 to 4 times per year    Active Member of [employer] or Organizations: No    Attends Banker Meetings: Never    Marital Status: Widowed     Family History:  The patient's family history includes Heart failure in her father; Lung cancer in her mother. There is no history of Breast cancer.  ROS:   12-point review of systems is negative unless otherwise noted in the HPI.   EKGs/Labs/Other Studies Reviewed:    Studies reviewed were summarized above. The additional studies were reviewed today:  2D echo 02/01/2021: 1. Left ventricular ejection fraction, by estimation, is 60 to 65%. The  left ventricle has normal function. The left ventricle has no regional  wall motion abnormalities. Left ventricular diastolic parameters are  consistent with Grade II diastolic  dysfunction (pseudonormalization).   2. Right ventricular systolic function is normal. The right ventricular  size is normal. There is mildly elevated pulmonary artery systolic  pressure. The estimated right ventricular systolic pressure is 44.6 mmHg.   3. The mitral valve is normal in structure. Mild mitral valve  regurgitation. No evidence of mitral stenosis.   4. Tricuspid valve regurgitation is mild to moderate.   5. The aortic valve is normal in structure. Aortic valve regurgitation is  mild. Mild aortic valve sclerosis is present, with no evidence of aortic  valve stenosis.   6. The inferior vena cava is dilated in size with >50% respiratory  variability, suggesting right atrial  pressure of 8 mmHg.  __________   2D echo 09/01/2019:  1. Left ventricular ejection fraction, by estimation, is 55 to 60%. The  left ventricle has normal function. The left ventricle has no regional  wall motion abnormalities. There is mild left  ventricular hypertrophy.  Left ventricular diastolic parameters  were normal.   2. Right ventricular systolic function is normal. The right ventricular  size is normal. There is mildly elevated pulmonary artery systolic  pressure. The estimated right ventricular systolic pressure is 45.0 mmHg.   3. The mitral valve is normal in structure. No evidence of mitral valve  regurgitation. No evidence of mitral stenosis.   4. Tricuspid valve regurgitation is mild to moderate.   5. The aortic valve is normal in structure. Aortic valve regurgitation is  not visualized. No aortic stenosis is present.   6. The inferior vena cava is normal in size with <50% respiratory  variability, suggesting right atrial pressure of 8 mmHg. __________   Eugenie Birks MPI 01/13/2019: There was no ST segment deviation noted during stress. No T wave inversion was noted during stress. The study is normal. This is a low risk study. The left ventricular ejection fraction is normal (55-65%). __________   2D echo 01/11/2019: 1. The left ventricle has normal systolic function with an ejection  fraction of 60-65%. The cavity size was normal. Left ventricular diastolic  Doppler parameters are consistent with pseudonormalization.   2. The right ventricle has normal systolic function. The cavity was  normal. There is no increase in right ventricular wall thickness. Right  ventricular systolic pressure is moderately elevated with an estimated  pressure of 51.0 mmHg.   3. The inferior vena cava was dilated in size with >50% respiratory variability.   EKG:  EKG is ordered today.  The EKG ordered today demonstrates NSR, 75 bpm, baseline artifact, low voltage QRS, no acute ST-T  changes  Recent Labs: No results found for requested labs within last 365 days.  Recent Lipid Panel    Component Value Date/Time   CHOL 187 01/13/2019 1238   TRIG 84 01/13/2019 1238   HDL 69 01/13/2019 1238   CHOLHDL 2.7 01/13/2019 1238   VLDL 17 01/13/2019 1238   LDLCALC 101 (H) 01/13/2019 1238    PHYSICAL EXAM:    VS:  BP 131/72 (BP Location: Left Arm, Patient Position: Sitting, Cuff Size: Normal)   Pulse 75   Ht 5' (1.524 m)   Wt 161 lb (73 kg)   BMI 31.44 kg/m   BMI: Body mass index is 31.44 kg/m.  Physical Exam Vitals reviewed.  Constitutional:      Appearance: She is well-developed.  HENT:     Head: Normocephalic and atraumatic.  Eyes:     General:        Right eye: No discharge.        Left eye: No discharge.  Neck:     Vascular: No JVD.  Cardiovascular:     Rate and Rhythm: Normal rate and regular rhythm.     Heart sounds: Normal heart sounds, S1 normal and S2 normal. Heart sounds not distant. No midsystolic click and no opening snap. No murmur heard.    No friction rub.  Pulmonary:     Effort: Pulmonary effort is normal. No respiratory distress.     Breath sounds: Normal breath sounds. No decreased breath sounds, wheezing, rhonchi or rales.  Chest:     Chest wall: No tenderness.  Abdominal:     General: There is no distension.  Musculoskeletal:     Cervical back: Normal range of motion.  Skin:    General: Skin is warm and dry.     Nails: There is no clubbing.  Neurological:     Mental Status: She is alert  and oriented to person, place, and time.  Psychiatric:        Speech: Speech normal.        Behavior: Behavior normal.        Thought Content: Thought content normal.        Judgment: Judgment normal.     Wt Readings from Last 3 Encounters:  05/29/23 161 lb (73 kg)  03/20/23 167 lb 9.6 oz (76 kg)  11/20/22 166 lb 12.8 oz (75.7 kg)     ASSESSMENT & PLAN:   HFpEF: Euvolemic and well compensated on low-dose furosemide 20 mg daily.  Most  recent echo showed normal LV diastolic function.  Given lack of heart failure symptoms,  in the context of stable echo findings, in an effort to avoid off target effects, defer addition of MRA or SGLT2 inhibitor at this time.  HTN: Blood pressure is well-controlled in the office today.  She remains on losartan 100 mg.  HLD: LDL 105 in 04/2023.  Target LDL less than 70.  Titrate atorvastatin to 40 mg daily.  She reports PCP will follow-up labs in 2 months.  PAD: Mildly reduced ABI with possible inflow disease noted on prior noninvasive imaging.  She is without symptoms of claudication or critical limb ischemia.  Mostly limited by chronic low back pain.  Continue medical therapy and walking regimen as tolerated.  Pulmonary pretension/COPD/chronic hypoxic respiratory failure with nocturnal supplemental oxygen: Stable.  Follow-up with pulmonology as directed.      Disposition: F/u with Dr. Kirke Corin or an APP in 6 months.   Medication Adjustments/Labs and Tests Ordered: Current medicines are reviewed at length with the patient today.  Concerns regarding medicines are outlined above. Medication changes, Labs and Tests ordered today are summarized above and listed in the Patient Instructions accessible in Encounters.   Signed, Eula Listen, PA-C 05/29/2023 10:34 AM     [facility] [place] [address] [place], [place] [address] [phone]

## 2023-06-20 ENCOUNTER — Ambulatory Visit: Payer: Medicare Other | Admitting: Pulmonary Disease

## 2023-07-05 ENCOUNTER — Ambulatory Visit: Payer: Medicare Other | Admitting: Pulmonary Disease

## 2023-07-05 ENCOUNTER — Encounter: Payer: Self-pay | Admitting: Pulmonary Disease

## 2023-07-05 VITALS — BP 110/70 | HR 79 | Temp 97.1°F | Ht 60.0 in | Wt 166.4 lb

## 2023-07-05 DIAGNOSIS — J449 Chronic obstructive pulmonary disease, unspecified: Secondary | ICD-10-CM

## 2023-07-05 DIAGNOSIS — I272 Pulmonary hypertension, unspecified: Secondary | ICD-10-CM

## 2023-07-05 DIAGNOSIS — J439 Emphysema, unspecified: Secondary | ICD-10-CM

## 2023-07-05 DIAGNOSIS — G4736 Sleep related hypoventilation in conditions classified elsewhere: Secondary | ICD-10-CM

## 2023-07-05 MED ORDER — TRELEGY ELLIPTA 100-62.5-25 MCG/ACT IN AEPB
1.0000 | INHALATION_SPRAY | Freq: Every day | RESPIRATORY_TRACT | 0 refills | Status: DC
Start: 2023-07-05 — End: 2024-01-01

## 2023-07-05 NOTE — Patient Instructions (Signed)
 VISIT SUMMARY:  Carolyn Steele, a 78 year old female with COPD and nocturnal hypoxemia, came in for a follow-up visit. Her COPD is stable with the use of Trelegy and nighttime oxygen therapy. She reports no new symptoms or exacerbations. Her nocturnal hypoxemia is also well-managed with nighttime oxygen use. She is compliant with her treatments and manages her medication refills through her pharmacy. She is aware of the high cost of Trelegy, especially as she enters the 'donut hole' in her insurance coverage.  YOUR PLAN:  -CHRONIC OBSTRUCTIVE PULMONARY DISEASE (COPD): COPD is a chronic lung condition that makes it hard to breathe. Your COPD is well-managed with Trelegy and nighttime oxygen therapy. Please continue using Trelegy as prescribed and use oxygen at night. We will provide samples of Trelegy if they are available to help with the cost.  -NOCTURNAL HYPOXEMIA: Nocturnal hypoxemia is a condition where your oxygen levels drop at night. This is effectively managed with supplemental oxygen at night. Please continue using your nighttime oxygen as directed.  INSTRUCTIONS:  Please continue with your current treatments and follow up with [place] if you experience any new symptoms or issues. If you need assistance with the cost of Trelegy, let us know so we can explore options like providing samples.  For more information, you can read your full clinical note, available in your patient portal.

## 2023-07-05 NOTE — Progress Notes (Signed)
 Subjective:    Patient ID: Carolyn Steele, female    DOB: 1946-03-14, 78 y.o.   MRN: [id number]  Patient Care Team: Jaclyn Shaggy, MD as PCP - General (Internal Medicine) Iran Ouch, MD as PCP - Cardiology (Cardiology)  Chief Complaint  Patient presents with   Follow-up    DOE. No wheezing or cough.     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  We last saw her on 20 November 2022 and at that time was instructed to continue Trelegy and nocturnal oxygen.  This is a scheduled visit.   HPI Discussed the use of AI scribe software for clinical note transcription with the patient, who gave verbal consent to proceed.  History of Present Illness   Carolyn Steele is a 78 year old female with COPD and nocturnal hypoxemia who presents for follow-up.  Her chronic obstructive pulmonary disease (COPD) is stable. She finds Trelegy helpful in managing her symptoms and uses oxygen at night, which aids in her condition. No new symptoms or exacerbations of her COPD are reported.  She has nocturnal hypoxemia, which is managed with nighttime oxygen use. She is compliant with this treatment and reports no issues with its use.  She manages her medication refills by contacting her pharmacy as needed. Trelegy is expensive, costing $471 last month, and she is aware of the cost implications as she enters the 'donut hole' in her insurance coverage. She does not require additional refills at this time, as she recently received her medication.      DATA 01/10/2019 CT angio chest: No PE, bilateral pleural effusions, small, right lung compressive atelectasis, cardiomegaly, centrilobular emphysema.  There is also moderate hiatal hernia 08/28/2019 overnight oximetry: Desaturations to as low as 83%,  patient instructed to use 2 L/min nocturnally 09/01/2019 PFTs: FEV1 0.75 L or 40% predicted FEV1/FVC 46%, FVC 1.63 L or 66% predicted. Post bronchodilator patient had 34% change in FEV1 and 27% change in FVC indicating asthmatic component. Diffusion capacity moderately to severely reduced.  Consistent with severe COPD with asthmatic component 09/01/2019 2D echo: LVEF is 55 to 60%, mild LVH, mild elevation of the pulmonary artery systolic pressure not determined, tricuspid regurgitation mild to moderate 02/01/2021 echocardiogram: LVEF 60 to 65%, Grade II DD, mild elevation in pulmonary systolic pressure, RV systolic pressure 44.6 mmHg. 08/08/2022 PFTs: FEV1 0.77 L or 44% predicted, FVC 1.63 L or 69% predicted, FEV1/FVC 47%.  There is a significant bronchodilator response with FEV1 increasing 14% postbronchodilator.  Lung volumes show air trapping and hyperinflation.  Diffusion capacity is severely reduced. 08/15/2022 ambulatory oximetry: Patient able to ambulate 750 feet, moderate dyspnea, O2 sat nadir 91%. 05/03/2023 echocardiogram: LVEF 60 to 65% no regional wall motion abnormalities, right ventricular systolic function normal estimated right ventricular systolic pressure is 43.7 mmHg, mild elevation in pulmonary artery systolic pressure.  Trivial mitral valve regurgitation.  Previously noted diastolic dysfunction was normal and worsening.  Review of Systems A 10 point review of systems was performed and it is as noted above otherwise negative.   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) 08/30/2020   COPD with asthma (HCC) 01/12/2020   Acute on chronic diastolic heart failure (HCC)  CHF (congestive heart failure) (HCC) 01/10/2019    Social History   Tobacco Use   Smoking status: Former    Current packs/day: 0.00    Average packs/day: 1 pack/day for 40.0 years (40.0 ttl  pk-yrs)    Types: Cigarettes    Start date: 3    Quit date: 2010    Years since quitting: 15.1   Smokeless tobacco: Never   Tobacco comments:    quit smoking approximately 10-12 years ago (05/28/2015)  Substance Use Topics   Alcohol use: No    Allergies  Allergen Reactions   Celebrex [Celecoxib] Swelling   Mobic [Meloxicam] Swelling   Adhesive [Tape] Itching and Rash   Silicone Itching and Rash    Current Meds  Medication Sig   albuterol (VENTOLIN HFA) 108 (90 Base) MCG/ACT inhaler USE 2 INHALATIONS BY MOUTH EVERY 6 HOURS AS NEEDED FOR WHEEZING  OR SHORTNESS OF BREATH   Ascorbic Acid (VITAMIN C PO) Take by mouth daily.   aspirin 81 MG tablet Take 81 mg by mouth daily.   atorvastatin (LIPITOR) 40 MG tablet Take 1 tablet (40 mg total) by mouth daily.   Calcium Carb-Cholecalciferol (CALCIUM 600 + D PO) Take 1 tablet by mouth daily.   Cranberry-Vitamin C 140-100 MG CAPS Take 2 capsules by mouth daily.    fexofenadine (ALLEGRA) 180 MG tablet Take 180 mg by mouth daily.   Fluticasone-Umeclidin-Vilant (TRELEGY ELLIPTA) 100-62.5-25 MCG/ACT AEPB Inhale 1 puff into the lungs daily.   Fluticasone-Umeclidin-Vilant (TRELEGY ELLIPTA) 100-62.5-25 MCG/ACT AEPB Inhale 1 puff into the lungs daily.   furosemide (LASIX) 20 MG tablet Take 1 tablet (20 mg total) by mouth daily.   gabapentin (NEURONTIN) 300 MG capsule Take 300 mg by mouth daily.   Glucosamine HCl 1500 MG TABS Take 1 tablet by mouth daily.   guaifenesin (HUMIBID E) 400 MG TABS tablet Take 400 mg by mouth daily.   levothyroxine (SYNTHROID, LEVOTHROID) 150 MCG tablet Take 150 mcg by mouth daily before breakfast. Take 1 tablet M, T, Thurs, Fri and Sat. Take 1/2 tablet Wednesday and Skip Sunday.   losartan (COZAAR) 100 MG tablet TAKE 1 TABLET BY MOUTH DAILY   Multiple Vitamins-Minerals (MULTIVITAMIN & MINERAL PO) Take 1 tablet by mouth daily.   Multiple Vitamins-Minerals (PRESERVISION AREDS 2) CAPS in the morning and at bedtime.    omeprazole (PRILOSEC) 40 MG capsule Take 40 mg by mouth daily.   OXYGEN Inhale 2 L into the lungs at bedtime.   traMADol (ULTRAM) 50 MG tablet Take 50 mg by mouth every 6 (six) hours as needed for moderate pain.    VITAMIN E PO Take by mouth daily.    Immunization History  Administered Date(s) Administered   Fluad Quad(high Dose 65+) 01/30/2019   Influenza-Unspecified 02/16/2020   Zoster Recombinant(Shingrix) 06/17/2018, 11/20/2018        Objective:     BP 110/70 (BP Location: Left Arm, Cuff Size: Normal)   Pulse 79   Temp (!) 97.1 F (36.2 C)   Ht 5' (1.524 m)   Wt 166 lb 6.4 oz (75.5 kg)   SpO2 95%   BMI 32.50 kg/m   SpO2: 95 % O2 Device: None (Room air)  GENERAL: Frail-appearing woman, no acute distress, fully ambulatory.  Uses walker for ambulating assistance. HEAD: Normocephalic, atraumatic.  EYES: Pupils equal, round, reactive to light.  No scleral icterus.  MOUTH: Wears dentures uppers and lowers NECK: Supple. No thyromegaly. Trachea midline.  Prominent A waves.  No adenopathy. PULMONARY: Increased AP  diameter. Distant breath sounds.  No adventitious sounds. CARDIOVASCULAR: S1 and S2. Regular rate and rhythm.  No rubs, murmurs or gallops heard. ABDOMEN: Benign. MUSCULOSKELETAL: Significant kyphosis.  OA changes both hands, no clubbing, trace to 1+ edema, LE's.  NEUROLOGIC: No focal deficit, speech is fluent, gait without disturbance though does use cane for steadying. SKIN: Intact,warm,dry.  Mild stasis changes lower extremities. PSYCH: Mood and behavior are normal.        Assessment & Plan:     ICD-10-CM   1. Stage 2 moderate COPD by GOLD classification (HCC)  J44.9     2. Nocturnal hypoxemia due to emphysema (HCC)  J43.9    G47.36     3. Pulmonary hypertension (HCC)  I27.20      Meds ordered this encounter  Medications   Fluticasone-Umeclidin-Vilant (TRELEGY ELLIPTA) 100-62.5-25 MCG/ACT AEPB    Sig: Inhale 1 puff into the lungs daily.     Dispense:  14 each    Refill:  0    Lot Number?:   [id number]    Expiration Date?:   09/29/2024    Quantity:   1   Discussion:    Chronic Obstructive Pulmonary Disease (COPD) COPD is well-managed with Trelegy and nocturnal oxygen therapy. She acknowledges the high cost of Trelegy. - Continue Trelegy as prescribed - Use oxygen at nighttime - Provide samples of Trelegy if available  Nocturnal Hypoxemia Nocturnal hypoxemia is effectively managed with supplemental oxygen at night. - Continue using supplemental oxygen at night      Advised if symptoms do not improve or worsen, to please contact office for sooner follow up or seek emergency care.    I spent 30 minutes of dedicated to the care of this patient on the date of this encounter to include pre-visit review of records, face-to-face time with the patient discussing conditions above, post visit ordering of testing, clinical documentation with the electronic health record, making appropriate referrals as documented, and communicating necessary findings to members of the patients care team.     C. Danice Goltz, MD Advanced Bronchoscopy PCCM [facility]    *This note was generated using voice recognition software/Dragon and/or AI transcription program.  Despite best efforts to proofread, errors can occur which can change the meaning. Any transcriptional errors that result from this process are unintentional and may not be fully corrected at the time of dictation.

## 2023-08-13 ENCOUNTER — Other Ambulatory Visit: Payer: Self-pay | Admitting: Cardiovascular Disease

## 2023-08-24 ENCOUNTER — Other Ambulatory Visit: Payer: Self-pay | Admitting: Pulmonary Disease

## 2023-09-14 ENCOUNTER — Other Ambulatory Visit: Payer: Self-pay | Admitting: Pulmonary Disease

## 2023-10-29 ENCOUNTER — Other Ambulatory Visit: Payer: Self-pay | Admitting: Cardiovascular Disease

## 2024-01-01 ENCOUNTER — Ambulatory Visit (INDEPENDENT_AMBULATORY_CARE_PROVIDER_SITE_OTHER): Admitting: Pulmonary Disease

## 2024-01-01 ENCOUNTER — Encounter: Payer: Self-pay | Admitting: Pulmonary Disease

## 2024-01-01 VITALS — BP 126/66 | HR 72 | Temp 98.2°F | Ht 60.0 in | Wt 163.0 lb

## 2024-01-01 DIAGNOSIS — J449 Chronic obstructive pulmonary disease, unspecified: Secondary | ICD-10-CM | POA: Diagnosis not present

## 2024-01-01 DIAGNOSIS — Z87891 Personal history of nicotine dependence: Secondary | ICD-10-CM | POA: Diagnosis not present

## 2024-01-01 DIAGNOSIS — J439 Emphysema, unspecified: Secondary | ICD-10-CM

## 2024-01-01 DIAGNOSIS — I272 Pulmonary hypertension, unspecified: Secondary | ICD-10-CM | POA: Diagnosis not present

## 2024-01-01 MED ORDER — TRELEGY ELLIPTA 100-62.5-25 MCG/ACT IN AEPB: 1.0000 | INHALATION_SPRAY | Freq: Every day | RESPIRATORY_TRACT | 3 refills | Status: AC

## 2024-01-01 NOTE — Progress Notes (Signed)
 Subjective:    Patient ID: Carolyn Steele, female    DOB: [birth year], 78 y.o.   MRN: [id number]  Patient Care Team: Corlis Honor BROCKS, MD as PCP - General (Internal Medicine) Darron Deatrice LABOR, MD as PCP - Cardiology (Cardiology)  Chief Complaint  Patient presents with   COPD    Shortness of breath on exertion. Occasional cough.     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  We last saw her on 05 July 2023 and at that time was instructed to continue Trelegy and nocturnal oxygen.  This is a scheduled visit.   HPI Discussed the use of AI scribe software for clinical note transcription with the patient, who gave verbal consent to proceed.  History of Present Illness   Carolyn Steele is a 78 year old female with COPD who presents for follow-up of her condition.  She is managing her COPD with Trelegy 100, which she receives through mail order. Her breathing has been stable with no significant issues or concerns. No chest pain is present. Her last chest X-ray was performed last year, and her last pulmonary function test was conducted in April of the previous year.  She has declined lung cancer screening previously.  She experiences occasional coughing, which she attributes to allergies, particularly during high ragweed alerts. She quit smoking almost sixteen years ago, which has positively impacted her condition.  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   She is compliant with nocturnal oxygen and notes the benefit of therapy.    DATA 01/10/2019 CT angio chest: No PE, bilateral pleural effusions, small, right lung compressive atelectasis, cardiomegaly, centrilobular emphysema.  There is also moderate hiatal  hernia 08/28/2019 overnight oximetry: Desaturations to as low as 83%, patient instructed to use 2 L/min nocturnally 09/01/2019 PFTs: FEV1 0.75 L or 40% predicted FEV1/FVC 46%, FVC 1.63 L or 66% predicted. Post bronchodilator patient had 34% change in FEV1 and 27% change in FVC indicating asthmatic component. Diffusion capacity moderately to severely reduced.  Consistent with severe COPD with asthmatic component 09/01/2019 2D echo: LVEF is 55 to 60%, mild LVH, mild elevation of the pulmonary artery systolic pressure not determined, tricuspid regurgitation mild to moderate 02/01/2021 echocardiogram: LVEF 60 to 65%, Grade II DD, mild elevation in pulmonary systolic pressure, RV systolic pressure 44.6 mmHg. 08/08/2022 PFTs: FEV1 0.77 L or 44% predicted, FVC 1.63 L or 69% predicted, FEV1/FVC 47%.  There is a significant bronchodilator response with FEV1 increasing 14% postbronchodilator.  Lung volumes show air trapping and hyperinflation.  Diffusion capacity is severely reduced. 08/15/2022 ambulatory oximetry: Patient able to ambulate 750 feet, moderate dyspnea, O2 sat nadir 91%. 05/03/2023 echocardiogram: LVEF 60 to 65% no regional wall motion abnormalities, right ventricular systolic function normal estimated right ventricular systolic pressure is 43.7 mmHg, mild elevation in pulmonary artery systolic pressure.  Trivial mitral valve regurgitation.  Previously noted diastolic dysfunction was normal and worsening.   Review of Systems A 10 point review of systems was performed and it is as noted above otherwise negative.   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) 08/30/2020   COPD with asthma (HCC) 01/12/2020  Acute on chronic diastolic heart failure (HCC)    CHF (congestive heart failure) (HCC) 01/10/2019    Social History   Tobacco Use   Smoking status: Former    Current  packs/day: 0.00    Average packs/day: 1 pack/day for 40.0 years (40.0 ttl pk-yrs)    Types: Cigarettes    Start date: 95    Quit date: 2010    Years since quitting: 15.6   Smokeless tobacco: Never   Tobacco comments:    quit smoking approximately 10-12 years ago (05/28/2015)  Substance Use Topics   Alcohol use: No    Allergies  Allergen Reactions   Celebrex [Celecoxib] Swelling   Mobic [Meloxicam] Swelling   Adhesive [Tape] Itching and Rash   Silicone Itching and Rash    Current Meds  Medication Sig   albuterol  (VENTOLIN  HFA) 108 (90 Base) MCG/ACT inhaler USE 2 INHALATIONS BY MOUTH EVERY 6 HOURS AS NEEDED FOR WHEEZING  OR SHORTNESS OF BREATH   Ascorbic Acid (VITAMIN C PO) Take by mouth daily.   aspirin  81 MG tablet Take 81 mg by mouth daily.   atorvastatin  (LIPITOR) 40 MG tablet Take 1 tablet (40 mg total) by mouth daily.   Calcium  Carb-Cholecalciferol (CALCIUM  600 + D PO) Take 1 tablet by mouth daily.   Cranberry-Vitamin C 140-100 MG CAPS Take 2 capsules by mouth daily.    fexofenadine (ALLEGRA) 180 MG tablet Take 180 mg by mouth daily.   furosemide  (LASIX ) 20 MG tablet Take 1 tablet (20 mg total) by mouth daily.   gabapentin  (NEURONTIN ) 300 MG capsule Take 300 mg by mouth daily.   Glucosamine HCl 1500 MG TABS Take 1 tablet by mouth daily.   guaifenesin  (HUMIBID E) 400 MG TABS tablet Take 400 mg by mouth daily.   latanoprost (XALATAN) 0.005 % ophthalmic solution Place 1 drop into the right eye at bedtime.   levothyroxine  (SYNTHROID , LEVOTHROID) 150 MCG tablet Take 150 mcg by mouth daily before breakfast. Take 1 tablet M, T, Thurs, Fri and Sat. Take 1/2 tablet Wednesday and Skip Sunday.   losartan  (COZAAR ) 100 MG tablet TAKE 1 TABLET BY MOUTH DAILY   Multiple Vitamins-Minerals (MULTIVITAMIN & MINERAL PO) Take 1 tablet by mouth daily.   Multiple Vitamins-Minerals (PRESERVISION AREDS 2) CAPS in the morning and at bedtime.   omeprazole (PRILOSEC) 40 MG capsule Take 40 mg by mouth  daily.   OXYGEN Inhale 2 L into the lungs at bedtime.   traMADol  (ULTRAM ) 50 MG tablet Take 50 mg by mouth every 6 (six) hours as needed for moderate pain.    VITAMIN E PO Take by mouth daily.   [DISCONTINUED] Fluticasone-Umeclidin-Vilant (TRELEGY ELLIPTA ) 100-62.5-25 MCG/ACT AEPB Inhale 1 puff into the lungs daily.    Immunization History  Administered Date(s) Administered   Fluad Quad(high Dose 65+) 01/30/2019   Influenza-Unspecified 02/16/2020   Zoster Recombinant(Shingrix) 06/17/2018, 11/20/2018        Objective:     BP 126/66   Pulse 72   Temp 98.2 F (36.8 C) (Oral)   Ht 5' (1.524 m)   Wt 163 lb (73.9 kg)   SpO2 95%   BMI 31.83 kg/m   SpO2: 95 %  GENERAL: Frail-appearing woman, no acute distress, fully ambulatory.  Uses walker for ambulating assistance. HEAD: Normocephalic, atraumatic.  EYES: Pupils equal, round, reactive to light.  No scleral icterus.  MOUTH: Wears dentures uppers and lowers NECK: Supple. No thyromegaly. Trachea midline.  Prominent A waves.  No adenopathy. PULMONARY: Increased AP diameter.  Distant breath sounds.  No adventitious sounds. CARDIOVASCULAR: S1 and S2. Regular rate and rhythm.  No rubs, murmurs or gallops heard. ABDOMEN: Benign. MUSCULOSKELETAL: Significant kyphosis.  OA changes both hands, no clubbing, trace edema, LE's.  NEUROLOGIC: No focal deficit, speech is fluent, gait without disturbance though does use walker for steadying. SKIN: Intact,warm,dry.  Mild stasis changes lower extremities. PSYCH: Mood and behavior are normal.  Assessment & Plan:     ICD-10-CM   1. Stage 2 moderate COPD by GOLD classification (HCC)  J44.9     2. Nocturnal hypoxemia due to emphysema (HCC)  J43.9    G47.36     3. Pulmonary hypertension (HCC)  I27.20      Meds ordered this encounter  Medications   Fluticasone-Umeclidin-Vilant (TRELEGY ELLIPTA ) 100-62.5-25 MCG/ACT AEPB    Sig: Inhale 1 puff into the lungs daily.    Dispense:  3 each     Refill:  3    Lot Number?:   [id number]    Expiration Date?:   03/01/2024    Quantity:   2   Discussion:    Chronic obstructive pulmonary disease (COPD) COPD is well-managed with current treatment. Occasional cough is attributed to allergies, particularly during high ragweed season. No chest pain reported. Smoking cessation maintained for almost sixteen years. - Continue Trelegy as prescribed. - Schedule pulmonary function tests during next visit. - Follow up in 4 to 6 months.     Nocturnal hypoxemia due to emphysema Patient is compliant with oxygen supplementation, notes benefit of therapy. - Continue supplemental oxygen at nighttime at 2 L/min  Pulmonary hypertension, mild Echocardiogram performed January 2025 showed stable mild elevation in pulmonary artery systolic pressure - Continue nocturnal oxygen supplementation  Advised if symptoms do not improve or worsen, to please contact office for sooner follow up or seek emergency care.    I spent 30 minutes of dedicated to the care of this patient on the date of this encounter to include pre-visit review of records, face-to-face time with the patient discussing conditions above, post visit ordering of testing, clinical documentation with the electronic health record, making appropriate referrals as documented, and communicating necessary findings to members of the patients care team.     C. Leita Sanders, MD Advanced Bronchoscopy PCCM [facility]    *This note was generated using voice recognition software/Dragon and/or AI transcription program.  Despite best efforts to proofread, errors can occur which can change the meaning. Any transcriptional errors that result from this process are unintentional and may not be fully corrected at the time of dictation.

## 2024-01-01 NOTE — Patient Instructions (Signed)
 VISIT SUMMARY:  Today, we discussed the management of your chronic obstructive pulmonary disease (COPD). You reported that your breathing has been stable and that you have not experienced any significant issues or chest pain. We also talked about your occasional coughing, which you believe is due to allergies, especially during high ragweed alerts. You have been diligent in taking your medication and have maintained smoking cessation for almost sixteen years, which is excellent.  YOUR PLAN:  -CHRONIC OBSTRUCTIVE PULMONARY DISEASE (COPD): COPD is a chronic lung condition that makes it hard to breathe. Your COPD is well-managed with your current medication, Trelegy. You should continue taking Trelegy as prescribed. We will schedule pulmonary function tests during your next visit to monitor your lung function. Please follow up in 4 to 6 months.  -NOCTURNAL HYPOXEMIA: Related to your COPD and its basically low levels of oxygen during sleep.  Continue using your oxygen as you are doing.  This has helped your your heart.  INSTRUCTIONS:  Please schedule your pulmonary function tests for your next visit and follow up in 4 to 6 months.

## 2024-02-04 NOTE — Progress Notes (Unsigned)
 Cardiology Office Note    Date:  02/05/2024   ID:  Carolyn Steele, DOB 03-Nov-1945, MRN [id number]  PCP:  Corlis Honor BROCKS, MD  Cardiologist:  Deatrice Cage, MD  Electrophysiologist:  None   Chief Complaint: Follow up  History of Present Illness:   Carolyn Steele is a 78 y.o. female with history of HFpEF, chronic hypoxic respiratory failure on nocturnal home oxygen, COPD, PAD medically managed, HTN, HLD, hypothyroidism, chronic back pain status post back stimulator, and GERD who presents for follow-up of HFpEF.    She was admitted to the hospital in 12/2018 with heart failure in the setting of uncontrolled hypertension.  Echo at that time showed normal LV systolic function with evidence of diastolic dysfunction.  Troponin was mildly elevated and felt to be related to supply demand ischemia.  Stress testing showed no evidence of ischemia or infarct.  Echo in 08/2019 showed normal LV systolic function, normal diastolic function, mild to moderate tricuspid regurgitation with mild pulmonary hypertension with an estimated PASP of 45 mmHg.  Noninvasive lower extremity imaging in 08/2019 showed an ABI of 0.75 on the right and 0.95 on the left.  Duplex showed significant iliac disease, worse on the right.  She has been medically managed in the context of no claudication.  She was admitted to [facility] in 12/2019 with a large pneumothorax after she fractured her ribs from a fall and required chest tube placement.  Echo in 2022 showed normal LV systolic function, grade 2 diastolic dysfunction, mild to moderate pulmonary hypertension, mild mitral regurgitation, and mild to moderate tricuspid regurgitation.  Echo in 05/2023 showed an EF of 60 to 65%, no regional wall motion abnormalities, normal LV diastolic function parameters, normal RV systolic function and ventricular cavity size, mildly elevated RVSP estimated at 43.7 mmHg, trivial mitral regurgitation, aortic valve sclerosis without evidence of stenosis, and an  estimated right atrial pressure of 3 mmHg.      She was last seen in the office in 05/2023 and was doing well from a cardiac perspective.  No changes were indicated in cardiac pharmacotherapy at that time.  Patient presents today overall doing well from a cardiac perspective. She reports that she has been feeling under the weather for the past week with upper respiratory congestion with DOE slightly worse than her baseline. Denies fever and chills. She denies chest pain, lightheadedness, dizziness, and lower extremity swelling. No symptoms of angina or cardiac decompensation.   Labs independently reviewed: 07/2023 - BUN 13, serum creatinine 0.53, potassium 4.3, TC 137, TG 54, HDL 87, LDL 79 04/2023 - TSH normal, albumin 4.2, AST/ALT normal  Past Medical History:  Diagnosis Date   (HFpEF) heart failure with preserved ejection fraction (HCC)    a. 12/2018 Echo: EF 60-65%, pseudonormalization. Nl RV fxn. RVSP 51 mmHg. Mild MR/TR; b. 01/2021 Echo: EF 60-65%, no rwma, GrII DD. Nl RV size/fxn. RVSP 44.57mmHg. Mild MR. Mild-mod TR. Mild AoV sclerosis w/o stenosis.   Acid reflux    Arthritis    Asthma    COPD (chronic obstructive pulmonary disease) (HCC)    a. Home O2 started 12/2018.   Demand ischemia (HCC)    a. 12/2018 mild hsTrop elevation in setting of diast chf/htn urgency-->MV: no ischemia/infarct. EF 55-65%.   Depression    Hypercholesteremia    Hypertension    Hypothyroidism    Wears glasses     Past Surgical History:  Procedure Laterality Date   CHOLECYSTECTOMY     COLONOSCOPY  COLONOSCOPY WITH PROPOFOL  N/A 09/21/2021   Procedure: COLONOSCOPY WITH PROPOFOL ;  Surgeon: Dessa Reyes ORN, MD;  Location: [facility];  Service: Endoscopy;  Laterality: N/A;   FOOT SURGERY Right    HAND SURGERY Bilateral    SHOULDER SURGERY Left    SPINAL CORD STIMULATOR INSERTION N/A 06/04/2015   Procedure: LUMBAR SPINAL CORD STIMULATOR INSERTION;  Surgeon: Deward Fabian, MD;  Location: [facility] NEURO ORS;   Service: Neurosurgery;  Laterality: N/A;    Current Medications: Current Meds  Medication Sig   albuterol  (VENTOLIN  HFA) 108 (90 Base) MCG/ACT inhaler USE 2 INHALATIONS BY MOUTH EVERY 6 HOURS AS NEEDED FOR WHEEZING  OR SHORTNESS OF BREATH   Ascorbic Acid (VITAMIN C PO) Take by mouth daily.   aspirin  81 MG tablet Take 81 mg by mouth daily.   atorvastatin  (LIPITOR) 40 MG tablet Take 1 tablet (40 mg total) by mouth daily.   Calcium  Carb-Cholecalciferol (CALCIUM  600 + D PO) Take 1 tablet by mouth daily.   Cranberry-Vitamin C 140-100 MG CAPS Take 2 capsules by mouth daily.    fexofenadine (ALLEGRA) 180 MG tablet Take 180 mg by mouth daily.   Fluticasone-Umeclidin-Vilant (TRELEGY ELLIPTA ) 100-62.5-25 MCG/ACT AEPB Inhale 1 puff into the lungs daily.   furosemide  (LASIX ) 20 MG tablet Take 1 tablet (20 mg total) by mouth daily.   gabapentin  (NEURONTIN ) 300 MG capsule Take 300 mg by mouth daily.   Glucosamine HCl 1500 MG TABS Take 1 tablet by mouth daily.   guaifenesin  (HUMIBID E) 400 MG TABS tablet Take 400 mg by mouth daily.   latanoprost (XALATAN) 0.005 % ophthalmic solution Place 1 drop into the right eye at bedtime.   levothyroxine  (SYNTHROID , LEVOTHROID) 150 MCG tablet Take 150 mcg by mouth daily before breakfast. Take 1 tablet M, T, Thurs, Fri and Sat. Take 1/2 tablet Wednesday and Skip Sunday.   losartan  (COZAAR ) 100 MG tablet TAKE 1 TABLET BY MOUTH DAILY   Multiple Vitamins-Minerals (MULTIVITAMIN & MINERAL PO) Take 1 tablet by mouth daily.   Multiple Vitamins-Minerals (PRESERVISION AREDS 2) CAPS in the morning and at bedtime.   omeprazole (PRILOSEC) 40 MG capsule Take 40 mg by mouth daily.   OXYGEN Inhale 2 L into the lungs at bedtime.   traMADol  (ULTRAM ) 50 MG tablet Take 50 mg by mouth every 6 (six) hours as needed for moderate pain.    VITAMIN E PO Take by mouth daily.    Allergies:   Celebrex [celecoxib], Mobic [meloxicam], Adhesive [tape], and Silicone   Social History    Socioeconomic History   Marital status: Widowed    Spouse name: Not on file   Number of children: Not on file   Years of education: Not on file   Highest education level: Not on file  Occupational History   Not on file  Tobacco Use   Smoking status: Former    Current packs/day: 0.00    Average packs/day: 1 pack/day for 40.0 years (40.0 ttl pk-yrs)    Types: Cigarettes    Start date: 10    Quit date: 2010    Years since quitting: 15.7   Smokeless tobacco: Never   Tobacco comments:    quit smoking approximately 10-12 years ago (05/28/2015)  Vaping Use   Vaping status: Never Used  Substance and Sexual Activity   Alcohol use: No   Drug use: No   Sexual activity: Not on file  Other Topics Concern   Not on file  Social History Narrative   Not on file  Social Drivers of Corporate investment banker Strain: Low Risk  (01/16/2024)   Received from [facility]   Overall Financial Resource Strain (CARDIA)    Difficulty of Paying Living Expenses: Not hard at all  Food Insecurity: No Food Insecurity (01/16/2024)   Received from [facility]   Hunger Vital Sign    Within the past 12 months, you worried that your food would run out before you got the money to buy more.: Never true    Within the past 12 months, the food you bought just didn't last and you didn't have money to get more.: Never true  Transportation Needs: No Transportation Needs (01/16/2024)   Received from [facility] - Transportation    In the past 12 months, has lack of transportation kept you from medical appointments or from getting medications?: No    Lack of Transportation (Non-Medical): No  Physical Activity: Insufficiently Active (03/03/2019)   Exercise Vital Sign    Days of Exercise per Week: 7 days    Minutes of Exercise per Session: 20 min  Stress: No Stress Concern Present (03/03/2019)   [employer] of Occupational Health - Occupational  Stress Questionnaire    Feeling of Stress : Not at all  Social Connections: Moderately Isolated (03/03/2019)   Social Connection and Isolation Panel    Frequency of Communication with Friends and Family: More than three times a week    Frequency of Social Gatherings with Friends and Family: More than three times a week    Attends Religious Services: 1 to 4 times per year    Active Member of [employer] or Organizations: No    Attends Banker Meetings: Never    Marital Status: Widowed     Family History:  The patient's family history includes Heart failure in her father; Lung cancer in her mother. There is no history of Breast cancer.  ROS:   12-point review of systems is negative unless otherwise noted in the HPI.   EKGs/Labs/Other Studies Reviewed:    Studies reviewed were summarized above. The additional studies were reviewed today:  2D echo 05/03/2023: 1. Left ventricular ejection fraction, by estimation, is 60 to 65%. The  left ventricle has normal function. The left ventricle has no regional  wall motion abnormalities. Left ventricular diastolic parameters were  normal.   2. Right ventricular systolic function is normal. The right ventricular  size is normal. There is mildly elevated pulmonary artery systolic  pressure. The estimated right ventricular systolic pressure is 43.7 mmHg.   3. The mitral valve is normal in structure. Trivial mitral valve  regurgitation. No evidence of mitral stenosis.   4. The aortic valve is tricuspid. There is mild thickening of the aortic  valve. Aortic valve regurgitation is not visualized. Aortic valve  sclerosis is present, with no evidence of aortic valve stenosis.   5. The inferior vena cava is normal in size with greater than 50%  respiratory variability, suggesting right atrial pressure of 3 mmHg.  ___________  2D echo 02/01/2021: 1. Left ventricular ejection fraction, by estimation, is 60 to 65%. The  left ventricle has normal  function. The left ventricle has no regional  wall motion abnormalities. Left ventricular diastolic parameters are  consistent with Grade II diastolic  dysfunction (pseudonormalization).   2. Right ventricular systolic function is normal. The right ventricular  size is normal. There is mildly elevated pulmonary artery systolic  pressure. The estimated right ventricular  systolic pressure is 44.6 mmHg.   3. The mitral valve is normal in structure. Mild mitral valve  regurgitation. No evidence of mitral stenosis.   4. Tricuspid valve regurgitation is mild to moderate.   5. The aortic valve is normal in structure. Aortic valve regurgitation is  mild. Mild aortic valve sclerosis is present, with no evidence of aortic  valve stenosis.   6. The inferior vena cava is dilated in size with >50% respiratory  variability, suggesting right atrial pressure of 8 mmHg.  __________   2D echo 09/01/2019:  1. Left ventricular ejection fraction, by estimation, is 55 to 60%. The  left ventricle has normal function. The left ventricle has no regional  wall motion abnormalities. There is mild left ventricular hypertrophy.  Left ventricular diastolic parameters  were normal.   2. Right ventricular systolic function is normal. The right ventricular  size is normal. There is mildly elevated pulmonary artery systolic  pressure. The estimated right ventricular systolic pressure is 45.0 mmHg.   3. The mitral valve is normal in structure. No evidence of mitral valve  regurgitation. No evidence of mitral stenosis.   4. Tricuspid valve regurgitation is mild to moderate.   5. The aortic valve is normal in structure. Aortic valve regurgitation is  not visualized. No aortic stenosis is present.   6. The inferior vena cava is normal in size with <50% respiratory  variability, suggesting right atrial pressure of 8 mmHg. __________   Lexiscan  MPI 01/13/2019: There was no ST segment deviation noted during stress. No T  wave inversion was noted during stress. The study is normal. This is a low risk study. The left ventricular ejection fraction is normal (55-65%). __________   2D echo 01/11/2019: 1. The left ventricle has normal systolic function with an ejection  fraction of 60-65%. The cavity size was normal. Left ventricular diastolic  Doppler parameters are consistent with pseudonormalization.   2. The right ventricle has normal systolic function. The cavity was  normal. There is no increase in right ventricular wall thickness. Right  ventricular systolic pressure is moderately elevated with an estimated  pressure of 51.0 mmHg.   3. The inferior vena cava was dilated in size with >50% respiratory variability.   EKG:  EKG is ordered today.  The EKG ordered today demonstrates sinus rhythm with low voltage QRS, unchanged from prior tracings  Recent Labs: No results found for requested labs within last 365 days.  Recent Lipid Panel    Component Value Date/Time   CHOL 187 01/13/2019 1238   TRIG 84 01/13/2019 1238   HDL 69 01/13/2019 1238   CHOLHDL 2.7 01/13/2019 1238   VLDL 17 01/13/2019 1238   LDLCALC 101 (H) 01/13/2019 1238    PHYSICAL EXAM:    VS:  BP 120/64 (BP Location: Left Arm, Patient Position: Sitting, Cuff Size: Normal)   Pulse 92   Ht 5' (1.524 m)   Wt 160 lb 9.6 oz (72.8 kg)   SpO2 95%   BMI 31.37 kg/m   BMI: Body mass index is 31.37 kg/m.  Physical Exam Vitals and nursing note reviewed.  Constitutional:      General: She is not in acute distress.    Appearance: Normal appearance.  Cardiovascular:     Rate and Rhythm: Normal rate and regular rhythm.     Heart sounds: No murmur heard. Pulmonary:     Effort: Pulmonary effort is normal. No respiratory distress.     Breath sounds: No wheezing or rales.  Comments: Distant lung sounds. Musculoskeletal:     Right lower leg: No edema.     Left lower leg: No edema.  Skin:    General: Skin is warm and dry.  Neurological:      General: No focal deficit present.     Mental Status: She is alert and oriented to person, place, and time. Mental status is at baseline.  Psychiatric:        Mood and Affect: Mood normal.        Behavior: Behavior normal.     Wt Readings from Last 3 Encounters:  02/05/24 160 lb 9.6 oz (72.8 kg)  01/01/24 163 lb (73.9 kg)  07/05/23 166 lb 6.4 oz (75.5 kg)     ASSESSMENT & PLAN:   HFpEF - Most recent echo 05/2023 with EF 60-65% and normal diastolic parameters.  Euvolemic and well compensated on Lasix  20 mg daily.  Will update BMP today.  Given lack of heart failure symptoms with stable echo findings, will defer addition of MRA and SGLT2 inhibitor at this time.  HTN - Blood pressure well-controlled.  She is continued on losartan  100 mg daily.  HLD - LDL 79 in 07/2023.  Continue atorvastatin  40 mg daily.  PAD - Mildly reduced ABI on prior noninvasive imaging.  No symptoms of claudication or critical limb ischemia.  Continue aspirin  and statin therapy.  Pulmonary hypertension COPD Chronic hypoxic respiratory failure with nocturnal supplemental oxygen - Dyspnea on exertion and slightly worsened over the past week in the setting of possible upper respiratory infection. Check CBC. Continues to follow with pulmonology.    Disposition: Update CBC and BMP. F/u with Dr. Darron or an APP in 6 months.   Medication Adjustments/Labs and Tests Ordered: Current medicines are reviewed at length with the patient today.  Concerns regarding medicines are outlined above. Medication changes, Labs and Tests ordered today are summarized above and listed in the Patient Instructions accessible in Encounters.   Signed, Lesley Maffucci, PA-C 02/05/2024 1:27 PM     [facility] [place] [address] [place], [place] [address] [phone]

## 2024-02-05 ENCOUNTER — Encounter: Payer: Self-pay | Admitting: Physician Assistant

## 2024-02-05 ENCOUNTER — Ambulatory Visit: Attending: Physician Assistant | Admitting: Physician Assistant

## 2024-02-05 VITALS — BP 120/64 | HR 92 | Ht 60.0 in | Wt 160.6 lb

## 2024-02-05 DIAGNOSIS — I5032 Chronic diastolic (congestive) heart failure: Secondary | ICD-10-CM | POA: Diagnosis not present

## 2024-02-05 DIAGNOSIS — I739 Peripheral vascular disease, unspecified: Secondary | ICD-10-CM | POA: Diagnosis not present

## 2024-02-05 DIAGNOSIS — J449 Chronic obstructive pulmonary disease, unspecified: Secondary | ICD-10-CM

## 2024-02-05 DIAGNOSIS — Z79899 Other long term (current) drug therapy: Secondary | ICD-10-CM

## 2024-02-05 DIAGNOSIS — E785 Hyperlipidemia, unspecified: Secondary | ICD-10-CM

## 2024-02-05 DIAGNOSIS — J9611 Chronic respiratory failure with hypoxia: Secondary | ICD-10-CM

## 2024-02-05 DIAGNOSIS — I2723 Pulmonary hypertension due to lung diseases and hypoxia: Secondary | ICD-10-CM

## 2024-02-05 DIAGNOSIS — I10 Essential (primary) hypertension: Secondary | ICD-10-CM

## 2024-02-05 NOTE — Progress Notes (Signed)
 Assessment & Plan:  There are no diagnoses linked to this encounter.  Patient Instructions  As we discussed you have COPD and asthma issues.  We are prescribing Trelegy 1 inhalation daily this should help with your breathing.  We are also providing you with a rescue inhaler.  Continue using your oxygen at nighttime.  This will help relax your heart and will also help with your shortness of breath during the day.  We will see him in follow-up in 3 months time call sooner should any new problems arise.  Please note: late entry documentation due to logistical difficulties during COVID-19 pandemic. This note is filed for information purposes only, and is not intended to be used for billing, nor does it represent the full scope/nature of the visit in question. Please see any associated scanned media linked to date of encounter for additional pertinent information.  Subjective:    HPI: Carolyn Steele is a 78 y.o. female presenting to the pulmonology clinic on 10/22/2019 with report of: Follow-up (pt c/o mild sob with exertion and dry cough at times prod with clear mucus. )     Outpatient Encounter Medications as of 10/22/2019  Medication Sig Note   aspirin  81 MG tablet Take 81 mg by mouth daily.    Calcium  Carb-Cholecalciferol (CALCIUM  600 + D PO) Take 1 tablet by mouth daily.    Cranberry-Vitamin C 140-100 MG CAPS Take 2 capsules by mouth daily.     fexofenadine (ALLEGRA) 180 MG tablet Take 180 mg by mouth daily.    furosemide  (LASIX ) 20 MG tablet Take 1 tablet (20 mg total) by mouth daily.    gabapentin  (NEURONTIN ) 300 MG capsule Take 300 mg by mouth daily.    Glucosamine HCl 1500 MG TABS Take 1 tablet by mouth daily.    guaifenesin  (HUMIBID E) 400 MG TABS tablet Take 400 mg by mouth daily.    levothyroxine  (SYNTHROID , LEVOTHROID) 150 MCG tablet Take 150 mcg by mouth daily before breakfast. Take 1 tablet M, T, Thurs, Fri and Sat. Take 1/2 tablet Wednesday and Skip Sunday.     Multiple Vitamins-Minerals (MULTIVITAMIN & MINERAL PO) Take 1 tablet by mouth daily.    Multiple Vitamins-Minerals (PRESERVISION AREDS 2) CAPS in the morning and at bedtime.    omeprazole (PRILOSEC) 40 MG capsule Take 40 mg by mouth daily.    OXYGEN Inhale 2 L into the lungs at bedtime.    traMADol  (ULTRAM ) 50 MG tablet Take 50 mg by mouth every 6 (six) hours as needed for moderate pain.     [DISCONTINUED] alendronate (FOSAMAX) 70 MG tablet Take 70 mg by mouth once a week. Take with a full glass of water on an empty stomach. Every Sunday (Patient not taking: Reported on 09/21/2021)    [DISCONTINUED] Cyanocobalamin (VITAMIN B 12 PO) Take 1,000 mg by mouth daily.    [DISCONTINUED] losartan  (COZAAR ) 25 MG tablet Take 1 tablet (25 mg total) by mouth daily.    [DISCONTINUED] Omega-3 Fatty Acids (FISH OIL) 1000 MG CAPS Take 500 mg by mouth daily. (Patient not taking: Reported on 05/29/2023)    [DISCONTINUED] pravastatin (PRAVACHOL) 40 MG tablet Take 40 mg by mouth daily. 04/21/2020: switch to atorvastatin    [EXPIRED] Fluticasone-Umeclidin-Vilant (TRELEGY ELLIPTA ) 100-62.5-25 MCG/INH AEPB Inhale 1 puff into the lungs daily for 1 day.    [DISCONTINUED] albuterol  (PROAIR  HFA) 108 (90 Base) MCG/ACT inhaler Inhale 2 puffs into the lungs every 6 (six) hours as needed for wheezing or shortness of breath.    [  DISCONTINUED] albuterol  (VENTOLIN  HFA) 108 (90 Base) MCG/ACT inhaler Inhale 2 puffs into the lungs every 6 (six) hours as needed for wheezing or shortness of breath.    [DISCONTINUED] Fluticasone-Umeclidin-Vilant (TRELEGY ELLIPTA ) 100-62.5-25 MCG/INH AEPB Inhale 1 puff into the lungs daily.    No facility-administered encounter medications on file as of 10/22/2019.      Objective:   Vitals:   10/22/19 0828  BP: 136/70  Pulse: 71  Temp: 98.1 F (36.7 C)  Height: 5' 1 (1.549 m)  Weight: 137 lb 3.2 oz (62.2 kg)  SpO2: 98%  TempSrc: Temporal  BMI (Calculated): 25.94     Physical exam  documentation is limited by delayed entry of information.

## 2024-02-05 NOTE — Patient Instructions (Signed)
 Medication Instructions:  None ordered at this time  *If you need a refill on your cardiac medications before your next appointment, please call your pharmacy*  Lab Work: Your provider would like for you to have following labs drawn today CBC and BMeT.   If you have labs (blood work) drawn today and your tests are completely normal, you will receive your results only by: MyChart Message (if you have MyChart) OR A paper copy in the mail If you have any lab test that is abnormal or we need to change your treatment, we will call you to review the results.  Follow-Up: At [facility], you and your health needs are our priority.  As part of our continuing mission to provide you with exceptional heart care, our providers are all part of one team.  This team includes your primary Cardiologist (physician) and Advanced Practice Providers or APPs (Physician Assistants and Nurse Practitioners) who all work together to provide you with the care you need, when you need it.  Your next appointment:   6 month(s)  Provider:   You may see Deatrice Cage, MD or one of the following Advanced Practice Providers on your designated Care Team:   Lesley Maffucci, PA-C Bernardino Bring, PA-C  We recommend signing up for the patient portal called MyChart.  Sign up information is provided on this After Visit Summary.  MyChart is used to connect with patients for Virtual Visits (Telemedicine).  Patients are able to view lab/test results, encounter notes, upcoming appointments, etc.  Non-urgent messages can be sent to your provider as well.   To learn more about what you can do with MyChart, go to [email or web address].

## 2024-02-05 NOTE — Progress Notes (Deleted)
 Assessment & Plan:  There are no diagnoses linked to this encounter.  Patient Instructions  As we discussed you have COPD and asthma issues.  We are prescribing Trelegy 1 inhalation daily this should help with your breathing.  We are also providing you with a rescue inhaler.  Continue using your oxygen at nighttime.  This will help relax your heart and will also help with your shortness of breath during the day.  We will see him in follow-up in 3 months time call sooner should any new problems arise.  Please note: late entry documentation due to logistical difficulties during COVID-19 pandemic. This note is filed for information purposes only, and is not intended to be used for billing, nor does it represent the full scope/nature of the visit in question. Please see any associated scanned media linked to date of encounter for additional pertinent information.  Subjective:    HPI: Carolyn Steele is a 78 y.o. female presenting to the pulmonology clinic on 10/22/2019 with report of: Follow-up (pt c/o mild sob with exertion and dry cough at times prod with clear mucus. )     Outpatient Encounter Medications as of 10/22/2019  Medication Sig Note   aspirin  81 MG tablet Take 81 mg by mouth daily.    Calcium  Carb-Cholecalciferol (CALCIUM  600 + D PO) Take 1 tablet by mouth daily.    Cranberry-Vitamin C 140-100 MG CAPS Take 2 capsules by mouth daily.     fexofenadine (ALLEGRA) 180 MG tablet Take 180 mg by mouth daily.    furosemide  (LASIX ) 20 MG tablet Take 1 tablet (20 mg total) by mouth daily.    gabapentin  (NEURONTIN ) 300 MG capsule Take 300 mg by mouth daily.    Glucosamine HCl 1500 MG TABS Take 1 tablet by mouth daily.    guaifenesin  (HUMIBID E) 400 MG TABS tablet Take 400 mg by mouth daily.    levothyroxine  (SYNTHROID , LEVOTHROID) 150 MCG tablet Take 150 mcg by mouth daily before breakfast. Take 1 tablet M, T, Thurs, Fri and Sat. Take 1/2 tablet Wednesday and Skip Sunday.     Multiple Vitamins-Minerals (MULTIVITAMIN & MINERAL PO) Take 1 tablet by mouth daily.    Multiple Vitamins-Minerals (PRESERVISION AREDS 2) CAPS in the morning and at bedtime.    omeprazole (PRILOSEC) 40 MG capsule Take 40 mg by mouth daily.    OXYGEN Inhale 2 L into the lungs at bedtime.    traMADol  (ULTRAM ) 50 MG tablet Take 50 mg by mouth every 6 (six) hours as needed for moderate pain.     [DISCONTINUED] alendronate (FOSAMAX) 70 MG tablet Take 70 mg by mouth once a week. Take with a full glass of water on an empty stomach. Every Sunday (Patient not taking: Reported on 09/21/2021)    [DISCONTINUED] Cyanocobalamin (VITAMIN B 12 PO) Take 1,000 mg by mouth daily.    [DISCONTINUED] losartan  (COZAAR ) 25 MG tablet Take 1 tablet (25 mg total) by mouth daily.    [DISCONTINUED] Omega-3 Fatty Acids (FISH OIL) 1000 MG CAPS Take 500 mg by mouth daily. (Patient not taking: Reported on 05/29/2023)    [DISCONTINUED] pravastatin (PRAVACHOL) 40 MG tablet Take 40 mg by mouth daily. 04/21/2020: switch to atorvastatin    [EXPIRED] Fluticasone-Umeclidin-Vilant (TRELEGY ELLIPTA ) 100-62.5-25 MCG/INH AEPB Inhale 1 puff into the lungs daily for 1 day.    [DISCONTINUED] albuterol  (PROAIR  HFA) 108 (90 Base) MCG/ACT inhaler Inhale 2 puffs into the lungs every 6 (six) hours as needed for wheezing or shortness of breath.    [  DISCONTINUED] albuterol  (VENTOLIN  HFA) 108 (90 Base) MCG/ACT inhaler Inhale 2 puffs into the lungs every 6 (six) hours as needed for wheezing or shortness of breath.    [DISCONTINUED] Fluticasone-Umeclidin-Vilant (TRELEGY ELLIPTA ) 100-62.5-25 MCG/INH AEPB Inhale 1 puff into the lungs daily.    No facility-administered encounter medications on file as of 10/22/2019.      Objective:   Vitals:   10/22/19 0828  BP: 136/70  Pulse: 71  Temp: 98.1 F (36.7 C)  Height: 5' 1 (1.549 m)  Weight: 137 lb 3.2 oz (62.2 kg)  SpO2: 98%  TempSrc: Temporal  BMI (Calculated): 25.94     Physical exam  documentation is limited by delayed entry of information.

## 2024-02-06 ENCOUNTER — Ambulatory Visit: Payer: Self-pay | Admitting: Physician Assistant

## 2024-02-06 LAB — BASIC METABOLIC PANEL WITH GFR
BUN/Creatinine Ratio: 21 (ref 12–28)
BUN: 12 mg/dL (ref 8–27)
CO2: 28 mmol/L (ref 20–29)
Calcium: 9.2 mg/dL (ref 8.7–10.3)
Chloride: 98 mmol/L (ref 96–106)
Creatinine, Ser: 0.58 mg/dL (ref 0.57–1.00)
Glucose: 102 mg/dL — ABNORMAL HIGH (ref 70–99)
Potassium: 3.9 mmol/L (ref 3.5–5.2)
Sodium: 141 mmol/L (ref 134–144)
eGFR: 93 mL/min/1.73 (ref >59)

## 2024-02-06 LAB — CBC
Hematocrit: 38.3 % (ref 34.0–46.6)
Hemoglobin: 11.9 g/dL (ref 11.1–15.9)
MCH: 28.6 pg (ref 26.6–33.0)
MCHC: 31.1 g/dL — ABNORMAL LOW (ref 31.5–35.7)
MCV: 92 fL (ref 79–97)
Platelets: 720 x10E3/uL — ABNORMAL HIGH (ref 150–450)
RBC: 4.16 x10E6/uL (ref 3.77–5.28)
RDW: 13.6 % (ref 11.7–15.4)
WBC: 11.7 x10E3/uL — ABNORMAL HIGH (ref 3.4–10.8)

## 2024-03-03 ENCOUNTER — Other Ambulatory Visit: Payer: Self-pay | Admitting: Family Medicine

## 2024-03-03 DIAGNOSIS — Z1231 Encounter for screening mammogram for malignant neoplasm of breast: Secondary | ICD-10-CM

## 2024-04-09 ENCOUNTER — Ambulatory Visit
Admission: RE | Admit: 2024-04-09 | Discharge: 2024-04-09 | Disposition: A | Discharge status: Home / Self Care | Source: Ambulatory Visit | Attending: Family Medicine | Admitting: Family Medicine

## 2024-04-09 DIAGNOSIS — Z1231 Encounter for screening mammogram for malignant neoplasm of breast: Secondary | ICD-10-CM | POA: Diagnosis present

## 2024-04-17 ENCOUNTER — Ambulatory Visit: Admitting: Pulmonary Disease

## 2024-04-17 ENCOUNTER — Ambulatory Visit: Payer: Self-pay | Admitting: Pulmonary Disease

## 2024-04-17 ENCOUNTER — Encounter: Payer: Self-pay | Admitting: Pulmonary Disease

## 2024-04-17 VITALS — BP 112/64 | HR 83 | Temp 98.1°F | Ht 60.0 in | Wt 157.0 lb

## 2024-04-17 DIAGNOSIS — Z87891 Personal history of nicotine dependence: Secondary | ICD-10-CM

## 2024-04-17 DIAGNOSIS — J449 Chronic obstructive pulmonary disease, unspecified: Secondary | ICD-10-CM | POA: Diagnosis not present

## 2024-04-17 DIAGNOSIS — R0982 Postnasal drip: Secondary | ICD-10-CM

## 2024-04-17 DIAGNOSIS — J0140 Acute pansinusitis, unspecified: Secondary | ICD-10-CM

## 2024-04-17 DIAGNOSIS — R070 Pain in throat: Secondary | ICD-10-CM

## 2024-04-17 DIAGNOSIS — J069 Acute upper respiratory infection, unspecified: Secondary | ICD-10-CM

## 2024-04-17 LAB — POC COVID19 BINAXNOW: SARS Coronavirus 2 Ag: NEGATIVE

## 2024-04-17 MED ORDER — AMOXICILLIN-POT CLAVULANATE 875-125 MG PO TABS: 1.0000 | ORAL_TABLET | Freq: Two times a day (BID) | ORAL | 0 refills | Status: AC

## 2024-04-17 NOTE — Patient Instructions (Signed)
 VISIT SUMMARY:  Today, you came in for a follow-up visit regarding your chronic obstructive pulmonary disease (COPD) and new symptoms of a mild upper respiratory infection.  YOUR PLAN:  -CHRONIC OBSTRUCTIVE PULMONARY DISEASE (COPD): COPD is a chronic lung condition that makes it hard to breathe. Your COPD is well-managed, and there have been no significant changes in your breathing. Please continue using your inhalers as prescribed.  -ACUTE SINUSITIS: Acute sinusitis is an infection of the sinuses that can cause a sore throat, postnasal drainage, and a raw throat. Your symptoms and examination suggest a bacterial sinus infection. A prescription for Augmentin  has been sent to your pharmacy. Please call if your symptoms worsen.  INSTRUCTIONS:  Please follow up if your symptoms worsen or if you have any concerns. Continue using your inhalers as prescribed for COPD. Start taking the Augmentin  as directed for your sinusitis.

## 2024-04-17 NOTE — Progress Notes (Signed)
 Subjective:    Patient ID: Carolyn Steele, female    DOB: 07-09-1945, 78 y.o.   MRN: [id number]  Patient Care Team: Corlis Honor BROCKS, MD (Inactive) as PCP - General (Internal Medicine) Darron Deatrice LABOR, MD as PCP - Cardiology (Cardiology)  Chief Complaint  Patient presents with   COPD    Using Trelegy. Shortness of breath on exertion.     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  We last saw her on 01 January 2024 and at that time was instructed to continue Trelegy and nocturnal oxygen.  This is a scheduled visit.   HPI Discussed the use of AI scribe software for clinical note transcription with the patient, who gave verbal consent to proceed.  History of Present Illness   Carolyn Steele is a 78 year old female with COPD who presents for follow-up.  Her breathing remains stable with no significant changes, and she continues to use her inhalers as prescribed.  She has developed symptoms of a mild upper respiratory infection over the weekend, including a cough and sore throat. No sputum production, fever, or chills are present. No malaise. She describes her throat as sore and reports having a whole lot of sinus drainage.  She has previously used a Z-Pak without effect and can tolerate most antibiotics, including doxycycline and Augmentin .      DATA 01/10/2019 CT angio chest: No PE, bilateral pleural effusions, small, right lung compressive atelectasis, cardiomegaly, centrilobular emphysema.  There is also moderate hiatal hernia 08/28/2019 overnight oximetry: Desaturations to as low as 83%, patient instructed to use 2 L/min nocturnally 09/01/2019 PFTs: FEV1 0.75 L or 40% predicted FEV1/FVC 46%, FVC 1.63 L or 66% predicted. Post bronchodilator patient had 34% change in  FEV1 and 27% change in FVC indicating asthmatic component. Diffusion capacity moderately to severely reduced.  Consistent with severe COPD with asthmatic component 09/01/2019 2D echo: LVEF is 55 to 60%, mild LVH, mild elevation of the pulmonary artery systolic pressure not determined, tricuspid regurgitation mild to moderate 02/01/2021 echocardiogram: LVEF 60 to 65%, Grade II DD, mild elevation in pulmonary systolic pressure, RV systolic pressure 44.6 mmHg. 08/08/2022 PFTs: FEV1 0.77 L or 44% predicted, FVC 1.63 L or 69% predicted, FEV1/FVC 47%.  There is a significant bronchodilator response with FEV1 increasing 14% postbronchodilator.  Lung volumes show air trapping and hyperinflation.  Diffusion capacity is severely reduced. 08/15/2022 ambulatory oximetry: Patient able to ambulate 750 feet, moderate dyspnea, O2 sat nadir 91%. 05/03/2023 echocardiogram: LVEF 60 to 65% no regional wall motion abnormalities, right ventricular systolic function normal estimated right ventricular systolic pressure is 43.7 mmHg, mild elevation in pulmonary artery systolic pressure.  Trivial mitral valve regurgitation.  Previously noted diastolic dysfunction was normal and worsening.  Review of Systems A 10 point review of systems was performed and it is as noted above otherwise negative.   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) 08/30/2020   COPD with asthma (HCC) 01/12/2020   Acute on chronic diastolic heart failure (HCC)    CHF (congestive heart failure) (HCC) 01/10/2019    Social History   Tobacco Use   Smoking status: Former    Current packs/day: 0.00    Average packs/day: 1 pack/day  for 40.0 years (40.0 ttl pk-yrs)    Types: Cigarettes    Start date: 57    Quit date: 2010    Years since quitting: 15.9   Smokeless tobacco: Never   Tobacco comments:    quit smoking approximately  10-12 years ago (05/28/2015)  Substance Use Topics   Alcohol use: No    Allergies[1]  Active Medications[2]  Immunization History  Administered Date(s) Administered   Fluad Quad(high Dose 65+) 01/30/2019   Influenza-Unspecified 02/16/2020   Pneumococcal Conjugate Pcv21, Polysaccharide Crm197 Conjugaf 02/19/2024   Zoster Recombinant(Shingrix) 06/17/2018, 11/20/2018   Zoster, Live 03/08/2013        Objective:    Vitals:   04/17/24 1359  BP: 112/64  Pulse: 83  Temp: 98.1 F (36.7 C)  Height: 5' (1.524 m)  Weight: 157 lb (71.2 kg)  SpO2: 96%  TempSrc: Temporal  BMI (Calculated): 30.66     SpO2: 96 %  GENERAL: Frail-appearing woman, no acute distress, fully ambulatory.  Uses walker for ambulating assistance. HEAD: Normocephalic, atraumatic.  EYES: Pupils equal, round, reactive to light.  No scleral icterus.  MOUTH: Wears dentures uppers and lowers.  Significant erythema posterior pharynx, purulent postnasal drip noted. NECK: Supple. No thyromegaly. Trachea midline.  Prominent A waves.  No adenopathy. PULMONARY: Increased AP diameter. Distant breath sounds.  No adventitious sounds. CARDIOVASCULAR: S1 and S2. Regular rate and rhythm.  No rubs, murmurs or gallops heard. ABDOMEN: Benign. MUSCULOSKELETAL: Significant kyphosis.  OA changes both hands, no clubbing, trace edema, LE's.  NEUROLOGIC: No focal deficit, speech is fluent, gait without disturbance though does use walker for steadying. SKIN: Intact,warm,dry.  Mild stasis changes lower extremities. PSYCH: Mood and behavior are normal.     Point-of-care COVID-19 testing: Negative  Assessment & Plan:     ICD-10-CM   1. Acute pansinusitis, recurrence not specified  J01.40     2. Upper respiratory tract infection, unspecified type  J06.9 POC COVID-19    3. Stage 2 moderate COPD by GOLD classification (HCC)  J44.9       Orders Placed This Encounter  Procedures   POC COVID-19    Meds ordered this encounter   Medications   amoxicillin -clavulanate (AUGMENTIN ) 875-125 MG tablet    Sig: Take 1 tablet by mouth 2 (two) times daily for 7 days.    Dispense:  14 tablet    Refill:  0   Discussion:    Chronic obstructive pulmonary disease COPD is well-managed with no significant changes in breathing. Continues to use inhalers as prescribed. - Continue current inhaler regimen.  Acute sinusitis Suspected due to sore throat, postnasal drainage, and raw throat appearance. COVID test negative. No fever or chills reported. Likely bacterial sinusitis given symptoms and examination findings. - Prescribed Augmentin  and sent prescription to pharmacy. - Advised to call if symptoms worsen.     Will see the patient in 3 months time.  Advised if symptoms do not improve or worsen, to please contact office for sooner follow up or seek emergency care.    I spent 33 minutes of dedicated to the care of this patient on the date of this encounter to include pre-visit review of records, face-to-face time with the patient discussing conditions above, post visit ordering of testing, clinical documentation with the electronic health record, making appropriate referrals as documented, and communicating necessary findings to members of the patients care team.     C. Leita Sanders, MD Advanced Bronchoscopy P[facility]    *This note was generated  using voice recognition software/Dragon and/or AI transcription program.  Despite best efforts to proofread, errors can occur which can change the meaning. Any transcriptional errors that result from this process are unintentional and may not be fully corrected at the time of dictation.     [1]  Allergies Allergen Reactions   Celebrex [Celecoxib] Swelling   Mobic [Meloxicam] Swelling   Adhesive [Tape] Itching and Rash   Silicone Itching and Rash  [2]  Current Meds  Medication Sig   albuterol  (VENTOLIN  HFA) 108 (90 Base) MCG/ACT inhaler USE 2  INHALATIONS BY MOUTH EVERY 6 HOURS AS NEEDED FOR WHEEZING  OR SHORTNESS OF BREATH   amoxicillin -clavulanate (AUGMENTIN ) 875-125 MG tablet Take 1 tablet by mouth 2 (two) times daily for 7 days.   Ascorbic Acid (VITAMIN C PO) Take by mouth daily.   aspirin  81 MG tablet Take 81 mg by mouth daily.   atorvastatin  (LIPITOR) 40 MG tablet Take 1 tablet (40 mg total) by mouth daily.   Calcium  Carb-Cholecalciferol (CALCIUM  600 + D PO) Take 1 tablet by mouth daily.   Cranberry-Vitamin C 140-100 MG CAPS Take 2 capsules by mouth daily.    fexofenadine (ALLEGRA) 180 MG tablet Take 180 mg by mouth daily.   Fluticasone-Umeclidin-Vilant (TRELEGY ELLIPTA ) 100-62.5-25 MCG/ACT AEPB Inhale 1 puff into the lungs daily.   furosemide  (LASIX ) 20 MG tablet Take 1 tablet (20 mg total) by mouth daily.   gabapentin  (NEURONTIN ) 300 MG capsule Take 300 mg by mouth daily.   Glucosamine HCl 1500 MG TABS Take 1 tablet by mouth daily.   guaifenesin  (HUMIBID E) 400 MG TABS tablet Take 400 mg by mouth daily.   latanoprost (XALATAN) 0.005 % ophthalmic solution Place 1 drop into the right eye at bedtime.   levothyroxine  (SYNTHROID , LEVOTHROID) 150 MCG tablet Take 150 mcg by mouth daily before breakfast. Take 1 tablet M, T, Thurs, Fri and Sat. Take 1/2 tablet Wednesday and Skip Sunday.   losartan  (COZAAR ) 100 MG tablet TAKE 1 TABLET BY MOUTH DAILY   Multiple Vitamins-Minerals (MULTIVITAMIN & MINERAL PO) Take 1 tablet by mouth daily.   Multiple Vitamins-Minerals (PRESERVISION AREDS 2) CAPS in the morning and at bedtime.   omeprazole (PRILOSEC) 40 MG capsule Take 40 mg by mouth daily.   OXYGEN Inhale 2 L into the lungs at bedtime.   traMADol  (ULTRAM ) 50 MG tablet Take 50 mg by mouth every 6 (six) hours as needed for moderate pain.    VITAMIN E PO Take by mouth daily.

## 2024-05-20 ENCOUNTER — Other Ambulatory Visit: Payer: Self-pay | Admitting: Pulmonary Disease

## 2024-05-28 ENCOUNTER — Encounter: Payer: Self-pay | Admitting: Ophthalmology

## 2024-05-30 NOTE — Discharge Instructions (Signed)

## 2024-06-02 ENCOUNTER — Ambulatory Visit: Admission: RE | Admit: 2024-06-02 | Source: Home / Self Care | Admitting: Ophthalmology

## 2024-06-03 ENCOUNTER — Encounter: Payer: Self-pay | Admitting: Ophthalmology

## 2024-06-03 ENCOUNTER — Other Ambulatory Visit: Payer: Self-pay

## 2024-06-03 ENCOUNTER — Encounter: Admitting: Anesthesiology

## 2024-06-03 ENCOUNTER — Encounter: Admission: RE | Disposition: A | Payer: Self-pay | Source: Home / Self Care | Attending: Ophthalmology

## 2024-06-03 DIAGNOSIS — H2511 Age-related nuclear cataract, right eye: Secondary | ICD-10-CM | POA: Insufficient documentation

## 2024-06-03 DIAGNOSIS — Z87891 Personal history of nicotine dependence: Secondary | ICD-10-CM | POA: Insufficient documentation

## 2024-06-03 DIAGNOSIS — J4489 Other specified chronic obstructive pulmonary disease: Secondary | ICD-10-CM | POA: Insufficient documentation

## 2024-06-03 DIAGNOSIS — I110 Hypertensive heart disease with heart failure: Secondary | ICD-10-CM | POA: Insufficient documentation

## 2024-06-03 DIAGNOSIS — E039 Hypothyroidism, unspecified: Secondary | ICD-10-CM | POA: Insufficient documentation

## 2024-06-03 DIAGNOSIS — K219 Gastro-esophageal reflux disease without esophagitis: Secondary | ICD-10-CM | POA: Insufficient documentation

## 2024-06-03 DIAGNOSIS — I5032 Chronic diastolic (congestive) heart failure: Secondary | ICD-10-CM | POA: Insufficient documentation

## 2024-06-03 MED ORDER — LACTATED RINGERS IV SOLN: INTRAVENOUS | Status: DC

## 2024-06-03 MED ORDER — LIDOCAINE HCL (PF) 2 % IJ SOLN
INTRAOCULAR | Status: DC | PRN
  Administered 2024-06-03: 4 mL via INTRAOCULAR

## 2024-06-03 MED ORDER — MIDAZOLAM HCL (PF) 2 MG/2ML IJ SOLN
INTRAMUSCULAR | Status: DC | PRN
  Administered 2024-06-03: 1 mg via INTRAVENOUS

## 2024-06-03 MED ORDER — SIGHTPATH DOSE#1 BSS IO SOLN
INTRAOCULAR | Status: DC | PRN
  Administered 2024-06-03: 81 mL via OPHTHALMIC

## 2024-06-03 MED ORDER — TETRACAINE HCL 0.5 % OP SOLN
1.0000 [drp] | OPHTHALMIC | Status: DC | PRN
  Administered 2024-06-03 (×3): 1 [drp] via OPHTHALMIC

## 2024-06-03 MED ORDER — FENTANYL CITRATE (PF) 100 MCG/2ML IJ SOLN
INTRAMUSCULAR | Status: DC | PRN
  Administered 2024-06-03: 50 ug via INTRAVENOUS

## 2024-06-03 MED ORDER — MIDAZOLAM HCL 2 MG/2ML IJ SOLN
INTRAMUSCULAR | Status: AC
  Filled 2024-06-03: qty 2

## 2024-06-03 MED ORDER — SIGHTPATH DOSE#1 BSS IO SOLN
INTRAOCULAR | Status: DC | PRN
  Administered 2024-06-03: 15 mL via INTRAOCULAR

## 2024-06-03 MED ORDER — FENTANYL CITRATE (PF) 100 MCG/2ML IJ SOLN
INTRAMUSCULAR | Status: AC
  Filled 2024-06-03: qty 2

## 2024-06-03 MED ORDER — MOXIFLOXACIN HCL 0.5 % OP SOLN
OPHTHALMIC | Status: DC | PRN
  Administered 2024-06-03: .2 mL via OPHTHALMIC

## 2024-06-03 MED ORDER — SIGHTPATH DOSE#1 NA HYALUR & NA CHOND-NA HYALUR IO KIT
PACK | INTRAOCULAR | Status: DC | PRN
  Administered 2024-06-03: 1 via OPHTHALMIC

## 2024-06-03 MED ORDER — PHENYLEPHRINE HCL 10 % OP SOLN
1.0000 [drp] | OPHTHALMIC | Status: AC | PRN
  Administered 2024-06-03 (×3): 1 [drp] via OPHTHALMIC

## 2024-06-03 MED ORDER — CYCLOPENTOLATE HCL 2 % OP SOLN
1.0000 [drp] | OPHTHALMIC | Status: AC | PRN
  Administered 2024-06-03 (×3): 1 [drp] via OPHTHALMIC

## 2024-06-03 NOTE — H&P (Signed)
 [facility]   Primary Care Physician:  Keven Crumbly Pap, MD Ophthalmologist: Dr. Adine Novak  Pre-Procedure History & Physical: HPI:  Carolyn Steele is a 79 y.o. female here for cataract surgery.   Past Medical History:  Diagnosis Date   (HFpEF) heart failure with preserved ejection fraction (HCC)    a. 12/2018 Echo: EF 60-65%, pseudonormalization. Nl RV fxn. RVSP 51 mmHg. Mild MR/TR; b. 01/2021 Echo: EF 60-65%, no rwma, GrII DD. Nl RV size/fxn. RVSP 44.19mmHg. Mild MR. Mild-mod TR. Mild AoV sclerosis w/o stenosis.   Acid reflux    Arthritis    Asthma    COPD (chronic obstructive pulmonary disease) (HCC)    a. Home O2 started 12/2018.   Demand ischemia (HCC)    a. 12/2018 mild hsTrop elevation in setting of diast chf/htn urgency-->MV: no ischemia/infarct. EF 55-65%.   Depression    Hypercholesteremia    Hypertension    Hypothyroidism    Wears glasses     Past Surgical History:  Procedure Laterality Date   CHOLECYSTECTOMY     COLONOSCOPY     COLONOSCOPY WITH PROPOFOL  N/A 09/21/2021   Procedure: COLONOSCOPY WITH PROPOFOL ;  Surgeon: Dessa Reyes ORN, MD;  Location: [facility];  Service: Endoscopy;  Laterality: N/A;   FOOT SURGERY Right    HAND SURGERY Bilateral    SHOULDER SURGERY Left    SPINAL CORD STIMULATOR INSERTION N/A 06/04/2015   Procedure: LUMBAR SPINAL CORD STIMULATOR INSERTION;  Surgeon: Deward Fabian, MD;  Location: [facility];  Service: Neurosurgery;  Laterality: N/A;    Prior to Admission medications  Medication Sig Start Date End Date Taking? Authorizing Provider  albuterol  (VENTOLIN  HFA) 108 (90 Base) MCG/ACT inhaler USE 2 INHALATIONS BY MOUTH EVERY 6 HOURS AS NEEDED FOR WHEEZING  OR SHORTNESS OF BREATH 05/21/24  Yes Tamea Dedra LITTIE, MD  Ascorbic Acid (VITAMIN C PO) Take by mouth daily.   Yes [provider]  aspirin  81 MG tablet Take 81 mg by mouth daily.   Yes [provider]  atorvastatin  (LIPITOR) 40 MG tablet Take 1  tablet (40 mg total) by mouth daily. 05/29/23 06/03/24 Yes Dunn, Bernardino HERO, PA-C  Calcium  Carb-Cholecalciferol (CALCIUM  600 + D PO) Take 1 tablet by mouth daily.   Yes [provider]  Cranberry-Vitamin C 140-100 MG CAPS Take 2 capsules by mouth daily.    Yes [provider]  diphenhydrAMINE (BENADRYL) 25 mg capsule Take 25 mg by mouth at bedtime.   Yes [provider]  fexofenadine (ALLEGRA) 180 MG tablet Take 180 mg by mouth daily.   Yes [provider]  Fluticasone-Umeclidin-Vilant (TRELEGY ELLIPTA ) 100-62.5-25 MCG/ACT AEPB Inhale 1 puff into the lungs daily. 01/01/24  Yes Tamea Dedra LITTIE, MD  furosemide  (LASIX ) 20 MG tablet Take 1 tablet (20 mg total) by mouth daily. 07/24/19  Yes Darron Deatrice LABOR, MD  gabapentin  (NEURONTIN ) 300 MG capsule Take 300 mg by mouth daily.   Yes [provider]  Glucosamine HCl 1500 MG TABS Take 1 tablet by mouth daily.   Yes [provider]  levothyroxine  (SYNTHROID , LEVOTHROID) 150 MCG tablet Take 150 mcg by mouth daily before breakfast. Take 1 tablet M, T, Thurs, Fri and Sat. Take 1/2 tablet Wednesday and Skip Sunday.   Yes [provider]  losartan  (COZAAR ) 100 MG tablet TAKE 1 TABLET BY MOUTH DAILY 10/30/23  Yes Darron Deatrice LABOR, MD  Multiple Vitamins-Minerals (MULTIVITAMIN & MINERAL PO) Take 1 tablet by mouth daily.   Yes [provider]  Multiple Vitamins-Minerals (PRESERVISION AREDS 2) CAPS in the morning and at bedtime. 07/15/19  Yes [provider]  omeprazole (PRILOSEC) 40 MG capsule Take 40 mg by mouth daily.   Yes [provider]  OXYGEN Inhale 2 L into the lungs at bedtime.   Yes [provider]  traMADol  (ULTRAM ) 50 MG tablet Take 50 mg by mouth every 6 (six) hours as needed for moderate pain.    Yes [provider]  VITAMIN E PO Take by mouth daily.   Yes [provider]  guaifenesin  (HUMIBID E) 400 MG TABS tablet Take 400 mg by mouth daily.     [provider]  latanoprost (XALATAN) 0.005 % ophthalmic solution Place 1 drop into the right eye at bedtime. 11/22/23   [provider]    Allergies as of 05/09/2024 - Review Complete 04/17/2024  Allergen Reaction Noted   Celebrex [celecoxib] Swelling 01/16/2014   Mobic [meloxicam] Swelling 01/16/2014   Adhesive [tape] Itching and Rash 01/16/2014   Silicone Itching and Rash 08/31/2021    Family History  Problem Relation Age of Onset   Lung cancer Mother    Heart failure Father    Breast cancer Neg Hx     Social History   Socioeconomic History   Marital status: Widowed    Spouse name: Not on file   Number of children: Not on file   Years of education: Not on file   Highest education level: Not on file  Occupational History   Not on file  Tobacco Use   Smoking status: Former    Current packs/day: 0.00    Average packs/day: 1 pack/day for 40.0 years (40.0 ttl pk-yrs)    Types: Cigarettes    Start date: 46    Quit date: 2010    Years since quitting: 16.1   Smokeless tobacco: Never   Tobacco comments:    quit smoking approximately 10-12 years ago (05/28/2015)  Vaping Use   Vaping status: Never Used  Substance and Sexual Activity   Alcohol use: No   Drug use: Never   Sexual activity: Not on file  Other Topics Concern   Not on file  Social History Narrative   Not on file   Social Drivers of Health   Tobacco Use: Medium Risk (05/28/2024)   Patient History    Smoking Tobacco Use: Former    Smokeless Tobacco Use: Never    Passive Exposure: Not on file  Financial Resource Strain: Low Risk  (03/25/2024)   Received from [facility]   Overall Financial Resource Strain (CARDIA)    Difficulty of Paying Living Expenses: Not very hard  Food Insecurity: No Food Insecurity (03/25/2024)   Received from [facility]   Epic    Within the past 12 months, you worried that your food would run out before you got the money to  buy more.: Never true    Within the past 12 months, the food you bought just didn't last and you didn't have money to get more.: Never true  Transportation Needs: No Transportation Needs (03/25/2024)   Received from [facility] - Transportation    In the past 12 months, has lack of transportation kept you from medical appointments or from getting medications?: No    Lack of Transportation (Non-Medical): No  Physical Activity: Not on file  Stress: Not on file  Social Connections: Not on file  Intimate Partner Violence: Not on  file  Depression (PHQ2-9): Not on file  Alcohol Screen: Not on file  Housing: Low Risk  (05/13/2024)   Received from [facility]   Epic    In the last 12 months, was there a time when you were not able to pay the mortgage or rent on time?: No    In the past 12 months, how many times have you moved where you were living?: 0    At any time in the past 12 months, were you homeless or living in a shelter (including now)?: No  Utilities: Not At Risk (03/25/2024)   Received from [facility]   Epic    In the past 12 months has the electric, gas, oil, or water company threatened to shut off services in your home?: No  Health Literacy: Not on file    Review of Systems: See HPI, otherwise negative ROS  Physical Exam: BP (!) 152/84   Pulse 82   Resp (!) 26   Ht 5' (1.524 m)   Wt 71.2 kg   SpO2 94%   BMI 30.66 kg/m  General:   Alert, cooperative. Head:  Normocephalic and atraumatic. Respiratory:  Normal work of breathing. Cardiovascular:  NAD  Impression/Plan: Ronal CROME Levengood is here for cataract surgery.  Risks, benefits, limitations, and alternatives regarding cataract surgery have been reviewed with the patient.  Questions have been answered.  All parties agreeable.   Adine Novak, MD  06/03/2024, 1:42 PM

## 2024-06-03 NOTE — Transfer of Care (Signed)
 Immediate Anesthesia Transfer of Care Note  Patient: Carolyn Steele  Procedure(s) Performed: PHACOEMULSIFICATION, CATARACT, WITH IOL INSERTION 10.49 00:54.6 (Right: Eye)  Patient Location: PACU  Anesthesia Type: MAC  Level of Consciousness: awake, alert  and patient cooperative  Airway and Oxygen Therapy: Patient Spontanous Breathing and Patient connected to supplemental oxygen  Post-op Assessment: Post-op Vital signs reviewed, Patient's Cardiovascular Status Stable, Respiratory Function Stable, Patent Airway and No signs of Nausea or vomiting  Post-op Vital Signs: Reviewed and stable  Complications: No notable events documented.

## 2024-07-17 ENCOUNTER — Ambulatory Visit: Admitting: Pulmonary Disease
